# Patient Record
Sex: Male | Born: 1960 | Race: White | Hispanic: No | Marital: Single | Smoking: Current every day smoker
Health system: Southern US, Community
[De-identification: ages and names within clinical notes are randomized; demographics above are authoritative.]

## PROBLEM LIST (undated history)

## (undated) DIAGNOSIS — F419 Anxiety disorder, unspecified: Secondary | ICD-10-CM

## (undated) DIAGNOSIS — I499 Cardiac arrhythmia, unspecified: Secondary | ICD-10-CM

## (undated) DIAGNOSIS — Z9289 Personal history of other medical treatment: Secondary | ICD-10-CM

## (undated) DIAGNOSIS — I509 Heart failure, unspecified: Secondary | ICD-10-CM

## (undated) DIAGNOSIS — I429 Cardiomyopathy, unspecified: Secondary | ICD-10-CM

## (undated) DIAGNOSIS — M1990 Unspecified osteoarthritis, unspecified site: Secondary | ICD-10-CM

## (undated) DIAGNOSIS — K219 Gastro-esophageal reflux disease without esophagitis: Secondary | ICD-10-CM

## (undated) DIAGNOSIS — I10 Essential (primary) hypertension: Secondary | ICD-10-CM

## (undated) DIAGNOSIS — J449 Chronic obstructive pulmonary disease, unspecified: Secondary | ICD-10-CM

## (undated) DIAGNOSIS — R1310 Dysphagia, unspecified: Secondary | ICD-10-CM

## (undated) HISTORY — PX: ESOPHAGOGASTRODUODENOSCOPY: SHX1529

## (undated) HISTORY — PX: JOINT REPLACEMENT: SHX530

## (undated) HISTORY — PX: EYE SURGERY: SHX253

---

## 2007-10-23 ENCOUNTER — Ambulatory Visit (HOSPITAL_COMMUNITY): Admission: EM | Admit: 2007-10-23 | Discharge: 2007-10-23 | Payer: Self-pay | Admitting: Orthopedic Surgery

## 2007-12-23 ENCOUNTER — Inpatient Hospital Stay (HOSPITAL_COMMUNITY): Admission: RE | Admit: 2007-12-23 | Discharge: 2007-12-26 | Payer: Self-pay | Admitting: Orthopedic Surgery

## 2008-05-25 ENCOUNTER — Ambulatory Visit (HOSPITAL_COMMUNITY): Admission: RE | Admit: 2008-05-25 | Discharge: 2008-05-25 | Payer: Self-pay | Admitting: Orthopedic Surgery

## 2008-11-11 ENCOUNTER — Inpatient Hospital Stay (HOSPITAL_COMMUNITY): Admission: RE | Admit: 2008-11-11 | Discharge: 2008-11-13 | Payer: Self-pay | Admitting: Orthopedic Surgery

## 2010-05-19 LAB — PROTIME-INR: Prothrombin Time: 28.6 seconds — ABNORMAL HIGH (ref 11.6–15.2)

## 2010-05-19 LAB — CBC
Hemoglobin: 9.3 g/dL — ABNORMAL LOW (ref 13.0–17.0)
MCHC: 34.3 g/dL (ref 30.0–36.0)
MCV: 85.7 fL (ref 78.0–100.0)
RBC: 3.17 MIL/uL — ABNORMAL LOW (ref 4.22–5.81)
RDW: 15.6 % — ABNORMAL HIGH (ref 11.5–15.5)

## 2010-05-19 LAB — BASIC METABOLIC PANEL
Calcium: 7.6 mg/dL — ABNORMAL LOW (ref 8.4–10.5)
Chloride: 102 mEq/L (ref 96–112)
Creatinine, Ser: 0.67 mg/dL (ref 0.4–1.5)
Potassium: 3.9 mEq/L (ref 3.5–5.1)
Sodium: 133 mEq/L — ABNORMAL LOW (ref 135–145)

## 2010-05-20 LAB — CBC
HCT: 43.3 % (ref 39.0–52.0)
Hemoglobin: 14.9 g/dL (ref 13.0–17.0)
MCV: 86.1 fL (ref 78.0–100.0)
MCV: 86.5 fL (ref 78.0–100.0)
RBC: 3.34 MIL/uL — ABNORMAL LOW (ref 4.22–5.81)
RBC: 5 MIL/uL (ref 4.22–5.81)
RDW: 16.7 % — ABNORMAL HIGH (ref 11.5–15.5)
WBC: 6.8 10*3/uL (ref 4.0–10.5)

## 2010-05-20 LAB — COMPREHENSIVE METABOLIC PANEL
Alkaline Phosphatase: 98 U/L (ref 39–117)
CO2: 27 mEq/L (ref 19–32)
Calcium: 8.9 mg/dL (ref 8.4–10.5)
GFR calc Af Amer: >60 mL/min (ref >60)

## 2010-05-20 LAB — BASIC METABOLIC PANEL
Calcium: 7.9 mg/dL — ABNORMAL LOW (ref 8.4–10.5)
GFR calc non Af Amer: >60 mL/min (ref >60)
Glucose, Bld: 82 mg/dL (ref 70–99)

## 2010-05-20 LAB — URINALYSIS, ROUTINE W REFLEX MICROSCOPIC
Hgb urine dipstick: NEGATIVE
Nitrite: NEGATIVE
Protein, ur: NEGATIVE mg/dL

## 2010-05-20 LAB — TYPE AND SCREEN: Antibody Screen: NEGATIVE

## 2010-05-20 LAB — PROTIME-INR: Prothrombin Time: 12.6 seconds (ref 11.6–15.2)

## 2010-05-20 LAB — APTT: aPTT: 26 seconds (ref 24–37)

## 2010-06-28 NOTE — H&P (Signed)
[name], [name]            ACCOUNT NO.:  [id number]   MEDICAL RECORD NO.:  [id number]          PATIENT TYPE:  INP   LOCATION:  1616                         FACILITY:  [facility]   PHYSICIAN:  Ollen Gross, M.D.    DATE OF BIRTH:  Nov 30, 1960   DATE OF ADMISSION:  10/23/2007  DATE OF DISCHARGE:                              HISTORY & PHYSICAL   REASON FOR ADMISSION:  Right hip pain due to dislocated right total hip.   CHIEF COMPLAINTS:  Right hip pain.   HISTORY OF PRESENT ILLNESS:  This 50 year old male with a history of a  right total hip placement was getting out of bed on this past Saturday  when he had the sudden onset of right hip pain.  He had difficulty with  ambulation and had extreme pain.  He eventually went to the [facility] on Tuesday where x-rays revealed a dislocated right total hip  replacement.  He was transferred to the [facility] orthopedic service at  [facility].  He reports no other complications upon his arrival.   PAST MEDICAL HISTORY:  1. Osteoarthritis with a right total hip replacement.  2. Hypertension.   PAST SURGICAL HISTORY:  Right total hip replacement.   FAMILY HISTORY:  Noncontributory.   SOCIAL HISTORY:  He does smoke half a pack of cigarettes per day.   DRUG ALLERGIES:  CODEINE.   MEDICATIONS:  1. Percocet.  2. Diazepam.  3. Lisinopril.  (Verify dosage and frequency with the patient.)   REVIEW OF SYSTEMS:  See HPI.   PHYSICAL EXAMINATION:  VITAL SIGNS:  Pulse 72, respirations 16, blood  pressure 121/83.  GENERAL:  Awake, alert and oriented, well-developed, well-nourished.  NECK:  Supple.  No carotid bruits.  CHEST:  Lungs clear to auscultation bilaterally.  BREASTS:  Deferred.  HEART:  Regular rate and rhythm.  S1-S2 distinct.  ABDOMEN:  Soft, nontender, nondistended.  Bowel sounds present.  GENITOURINARY:  Deferred.  EXTREMITIES:  Right lower extremity shortened, externally rotated.  SKIN:  Dorsalis pedis pulse  positive.  NEUROLOGIC:  Intact distal sensibilities.   LABORATORY DATA:  All labs, EKG, chest x-ray all pending presurgical  testing.   IMPRESSION:  Dislocated right total hip replacement.   PLAN OF ACTION:  Closed reduction, right total hip replacement by Dr.  Lequita Halt.   He does have a scheduled revision of his right total hip in November per  patient history.     ______________________________  Yetta Glassman [place], [place]      Ollen Gross, M.D.  Electronically Signed    BLM/MEDQ  D:  10/23/2007  T:  10/23/2007  Job:  [id number]

## 2010-06-28 NOTE — H&P (Signed)
[name], [name]            ACCOUNT NO.:  [id number]   MEDICAL RECORD NO.:  [id number]          PATIENT TYPE:  INP   LOCATION:  NA                           FACILITY:  [facility]   PHYSICIAN:  Ollen Gross, M.D.    DATE OF BIRTH:  1960/10/10   DATE OF ADMISSION:  12/23/2007  DATE OF DISCHARGE:                              HISTORY & PHYSICAL   CHIEF COMPLAINT:  Right hip instability.   HISTORY OF PRESENT ILLNESS:  The patient is a 50 year old male well  known to Dr. Homero Fellers Aluisio.  Had previously undergone a right total hip.  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  Back in  1998, he underwent takedown of the right leg fusion and conversion over  to a right total hip.  Unfortunately he has gone on to have multiple  dislocations here recently felt be due to some polyethylene wear.  Risks  and benefits have been discussed and felt that he would benefit with  undergoing revision surgery.  The patient subsequently admitted to the  hospital.   ALLERGIES:  No known drug allergies.   CURRENT MEDICATIONS:  Diazepam, Prevacid, Percocet, lisinopril.   PAST MEDICAL HISTORY:  1. Hypertension.  2. Reflux disease.  3. History of diverticulosis.  4. Prior history of Legg-Calve Perthes' disease.   PAST SURGICAL HISTORY:  1. Had a fusion of his hip as a child.  2. Had a takedown fusion conversion over to a right total hip in 1998.  3. He has undergone diverticular disease.  4. He has also undergone multiple closed reductions for dislocations.   FAMILY HISTORY:  Father deceased at 16 with a history of stroke.  Mother  living at age 57 in good health with the exception of diabetes.   SOCIAL HISTORY:  Single.  Current smoker about a 1/2 pack per day.  No  alcohol.  Lives alone.  Does not have anyone lined up.   REVIEW OF SYSTEMS:  GENERAL:  No fevers, chills or night sweats.  NEURO:  No seizures,  syncope or paralysis.  RESPIRATORY: No shortness breath,  productive cough or hemoptysis.  CARDIOVASCULAR: No chest pain, angina  or orthopnea.  GI: No nausea, vomiting, diarrhea or constipation.  GU:  No dysuria, hematuria or discharge.  MUSCULOSKELETAL:  Right hip.   PHYSICAL EXAMINATION:  VITAL SIGNS: Pulse 56, respirations 12, blood  pressure 162/90.  GENERAL: A 50 year old white male, thin frame.  Alert, oriented and  cooperative.  HEENT: Normocephalic, atraumatic.  Pupils are round and reactive.  EOMs  intact.  NECK:  Supple.  No bruits.  CHEST: Clear anterior and posterior chest wall.  There are no rhonchi,  rales or wheezing.  HEART: Regular rate and rhythm.  S1 and S2 noted.  No murmurs.  ABDOMEN: Soft, flat, nontender.  Bowel sounds present.  BREASTS/ GENITALIA:  Not done.    EXTREMITIES:  Right hip flexion 110 degrees; internal rotation 20,  external rotation 30, abduction 30.   IMPRESSION:  Unstable right total hip arthroplasty.  PLAN:  The patient admitted to [facility] to undergo  acetabular revision of the previous right total hip surgery.  Will be  performed by Dr. Ollen Gross.      Alexzandrew L. Perkins, P.A.C.      Ollen Gross, M.D.  Electronically Signed    ALP/MEDQ  D:  12/17/2007  T:  12/17/2007  Job:  [id number]   cc:   Dr. Donata Duff, Drain

## 2010-06-28 NOTE — Op Note (Signed)
[name], [name]            ACCOUNT NO.:  [id number]   MEDICAL RECORD NO.:  [id number]          PATIENT TYPE:  INP   LOCATION:  1616                         FACILITY:  [facility]   PHYSICIAN:  Ollen Gross, M.D.    DATE OF BIRTH:  31-Jul-1960   DATE OF PROCEDURE:  10/23/2007  DATE OF DISCHARGE:                               OPERATIVE REPORT   PREOPERATIVE DIAGNOSIS:  Dislocated right total hip arthroplasty.   POSTOPERATIVE DIAGNOSIS:  Dislocated right total hip arthroplasty.   PROCEDURE:  Right hip closed reduction.   SURGEON:  Ollen Gross, M.D.   ASSISTANT:  None.   ANESTHESIA:  General   COMPLICATIONS:  None.   CONDITION:  Stable to recovery.   BRIEF CLINICAL NOTE:  [name] is a 50 year old male who underwent a  conversion of a fused right hip to a total hip arthroplasty  approximately 10 years ago.  He did very well until this past year when  he sustained approximately 2-3 dislocations.  He dislocated last night.  He was already scheduled for revision of the acetabular component in  November.  He was transferred from [facility] here for  management.  He presents now for closed reduction and bracing.   PROCEDURE IN DETAIL:  After the successful administration of general  anesthetic, my assistant placed counter traction on his pelvis while I  flexed the hip and pulled traction on it.  There was an audible  reduction.  Once reduced, he had very good range of motion with flexion  to 90 and about 30 degrees of rotation in each direction.  He had also  full extension with full external rotation.  X-ray showed that the hip  was reduced concentrically.  He was placed into an abduction pillow,  awakened and transported to recovery in stable condition.      Ollen Gross, M.D.  Electronically Signed     FA/MEDQ  D:  10/23/2007  T:  10/23/2007  Job:  [id number]

## 2010-06-28 NOTE — Op Note (Signed)
NAMEGARRIS, [name]            ACCOUNT NO.:  [id number]   MEDICAL RECORD NO.:  [id number]          PATIENT TYPE:  INP   LOCATION:  1612                         FACILITY:  [facility]   PHYSICIAN:  Ollen Gross, M.D.    DATE OF BIRTH:  [birth year]   DATE OF PROCEDURE:  12/23/2007  DATE OF DISCHARGE:                               OPERATIVE REPORT   PREOPERATIVE DIAGNOSIS:  Unstable right total hip arthroplasty.   POSTOPERATIVE DIAGNOSIS:  Unstable right total hip arthroplasty.   PROCEDURE:  Right total hip arthroplasty revision.   SURGEON:  Ollen Gross, M.D.   ASSISTANT:  [name] PA-C   ANESTHESIA:  General.   ESTIMATED BLOOD LOSS:  300 mL.   DRAIN:  Hemovac times one.   COMPLICATIONS:  None.   CONDITION:  Stable to recovery.   CLINICAL NOTE:  [name] is a 50 year old male who had a takedown of a  fused hip and conversion to a right total hip arthroplasty about 10 or  11 years ago.  The past year he has started to develop some instability  symptoms and has had two or more dislocations that had to be treated  with closed reduction.  He presents now for our revision for the  instability problem.   PROCEDURE IN DETAIL:  After successful administration of general  anesthetic, the patient is placed in left lateral decubitus position  with the right side up and held with the hip positioner.  Right lower  extremity is isolated from his perineum with plastic drapes and prepped  and draped in usual sterile fashion.  Posterolateral incision is made  with a 10 blade through subcutaneous tissue to the fascia lata which was  incised in line with the skin incision.  Sciatic nerve was palpated and  protected and posterior pseudocapsule excised off the femur.  He had a  fair amount of synovitis in the joint but the fluid was clear.  I  dislocated the hip and removed the femoral head.  I then disrupted the  interface between the SROM stem and sleeve and easily extracted the  stem.   This with 20 x 15 standard offset stem.  We then retracted the  femur anteriorly to gain acetabular closure.  Acetabular soft tissue  scar and spurs were removed off the rim of the component.  I removed the  polyethylene with an osteotome.  There were two peripheral screws which  were removed with the screwdriver.  I then disrupted the interface  between the acetabular shell and the underlying bone using first a 1/2  inch curved osteotome at the surface of the interface and then using the  Morland revision osteotomes to remove the bone from the cup.  The cup  was removed with minimal bone loss.  There is no acetabular defect.  This was a 54 mm cup.  I began reaming at 53 mm and went up to 55 mm  which had a good wound bed so I went to a 56 mm Pinnacle multihole  acetabular shell and placed in anatomic position with excellent purchase  and transfixed with two dome screws with excellent  purchase.  I then  placed a 40 mm trial liner for a 40 metal-on-metal replacement.  We then  placed a 20 x 15, 36 plus 5 trial into the femur.  With a 40 plus 0  head, it reduced easily,  with a 40 plus 6 it had perfect soft tissue  tension.  There was great stability with full extension, full external  rotation, 70 degrees flexion, 40 degrees adduction, 90 degrees internal  rotation and 90 degrees flexion with 90 degrees of internal rotation.  By placing the right leg on top of the left there is about 1/2 inch  difference which is between 1/4 and 3/8 inch better than preop.  Hip is  then dislocated.  Trials removed.  The permanent 40 mm neutral Ultamet  liner was placed into the acetabular shell and the permanent 20 x 15, 36  plus 12 stem was placed into the femur and impacted in about 20 degrees  of anteversion.  This matched the anteversion of the trial.  The 40.6  head is placed and the hip was reduced with the same stability  parameters.  The wound was copiously irrigated with saline solution and   posterior soft tissues reattached with interrupted #1 Ethibond.  Fascia  lata was closed over a Hemovac drain with interrupted #1 Vicryl, subcu  closed with 2-0 Vicryl and skin with staples.  Drains hooked to suction.  Incision cleaned and dried.  A bulky sterile dressing applied.  He is  placed into a knee immobilizer, awakened and transferred to recovery in  stable condition.      Ollen Gross, M.D.  Electronically Signed     FA/MEDQ  D:  12/23/2007  T:  12/24/2007  Job:  [id number]

## 2010-07-01 NOTE — Discharge Summary (Signed)
[name], [name]            ACCOUNT NO.:  [id number]   MEDICAL RECORD NO.:  [id number]          PATIENT TYPE:  INP   LOCATION:  1612                         [facility]ACILITY:  [facility]   PHYSICIAN:  Ollen Gross, M.D.    DATE OF BIRTH:  1960/08/22   DATE OF ADMISSION:  12/23/2007  DATE OF DISCHARGE:  12/26/2007                               DISCHARGE SUMMARY   ADMITTING DIAGNOSES:  1. Unstable right total hip.  2. Hypertension.  3. Reflux disease.  4. History of diverticulosis.  5. History of Legg-Calve-Perthes disease.   DISCHARGE DIAGNOSES:  1. Unstable right total hip arthroplasty status post right total hip      arthroplasty revision.  2. Mild postop blood loss anemia, did not require transfusion.  3. Mild postop hypokalemia.  4. Mild postop hyponatremia.  5. Unstable right total hip.  6. Hypertension.  7. Reflux disease.  8. History of diverticulosis.  9. History of Legg-Calve-Perthes disease.   PROCEDURE:  December 23, 2007 right total hip arthroplasty revision,  surgeon Dr. Despina Hick. Assistant [name] PA-C.  Anesthesia general.   CONSULTS:  None.   BRIEF HISTORY:  [name] is a 50 year old male with a takedown fusion  conversion to right total hip about 10 or 11 years ago.  In the past  year, he started to develop some instability symptoms, had 2 or more  dislocations, treated with closed reductions.  Now presents for a  revision procedure for instability.   LABORATORY DATA:  Preop CBC showed hemoglobin 16.1, hematocrit of 46.7,  white cell count 7.3, platelets 315.  Postop hemoglobin 12.2, 22.7  H  and H 12.1 and 35.6.  PT/PTT preop 12.8 and 27 respectively, INR 1.  Serial protimes followed in protocol.  PT/INR 22, 1.8.  Chem panel on  admission all within normal limits.  Serial BMETs were followed.  Potassium dropped to 3.6, 3.4 back up to 3.6, sodium dropped 140-137  last noted at 133.  Preop UA, small bili otherwise negative.  Blood type  O negative.  EKG  October 23, 2007 sinus rhythm, right axis deviation, nonspecific T-  wave abnormality, no previous to compare confirmed by Dr. Cathren Laine.  X-rays:  2-view chest December 23, 2007 no active disease.  Hip films  December 17, 2007 stable appearance of bipolar right hip prosthesis,  chronic acetabular deformity, abnormal tilting of acetabular component,  no acute findings.  Hip and pelvis film:  Postoperative changes, status  post arthroplasty revision.  No adverse features noted.   HOSPITAL COURSE:  The patient admitted to [facility],  tolerated te procedure well, later transferred to orthopedic floor  started on PCA and p.o. analgesic for pain control.  __________ postop  IV antibiotics.  Doing very well on the morning of day 1, actually got  up and walked with therapy.  Had a little low sodium, so put on  potassium supplements.  Blood pressure meds for resumed although  lisinopril was on hold because his pressure was low normal .  Postoperative had decent output.  By day 2, dressing change, incision  looked good.  Progressing well with physical  therapy.   DISCHARGE/PLAN:  Started making arrangements for home, although there  was some concern whether he would need some skilled nursing facility,  since he did live alone.  He did contact the family members and some  arrangements were made for him to go home with family.  Home health was  set up and he was doing well on December 26, 2007 and discharged home  with family.   DISCHARGE/PLAN:  1. Was discharged home December 26, 2007.  2. Discharge diagnoses please see above.  3. Discharge meds:  Percocet, Robaxin, Coumadin.   DISCHARGE DIET:  Heart-healthy diet   ACTIVITIES:  Partial weightbearing 25-50 percent right lower extremity.  Hip precautions, total protocol.   FOLLOW UP:  Two weeks.   DISPOSITION:  Home with family.   DISCHARGE CONDITION:  Stable, improving.   CONDITION ON DISCHARGE:  Improved.  Assistant facial  date of clock of  clot point.  Thank you      Alexzandrew L. Perkins, P.A.C.      Ollen Gross, M.D.  Electronically Signed    ALP/MEDQ  D:  02/05/2008  T:  02/05/2008  Job:  [id number]   cc:   Ollen Gross, M.D.  Fax: [phone]   Dr. Wyline Mood

## 2010-11-15 LAB — CBC
HCT: 33.8 — ABNORMAL LOW
HCT: 35.6 — ABNORMAL LOW
Hemoglobin: 11.7 — ABNORMAL LOW
Hemoglobin: 16.1
MCV: 92.4
Platelets: 315
RBC: 3.81 — ABNORMAL LOW
RBC: 3.85 — ABNORMAL LOW
RBC: 5.02
RDW: 15.4
WBC: 10.4
WBC: 10.5
WBC: 10.8 — ABNORMAL HIGH
WBC: 7.3

## 2010-11-15 LAB — URINALYSIS, ROUTINE W REFLEX MICROSCOPIC
Glucose, UA: NEGATIVE
Hgb urine dipstick: NEGATIVE
Protein, ur: NEGATIVE

## 2010-11-15 LAB — TYPE AND SCREEN
ABO/RH(D): O NEG
Antibody Screen: NEGATIVE

## 2010-11-15 LAB — PROTIME-INR
INR: 1.1
Prothrombin Time: 12.8
Prothrombin Time: 14.6
Prothrombin Time: 22 — ABNORMAL HIGH

## 2010-11-15 LAB — BASIC METABOLIC PANEL
Calcium: 8.4
Chloride: 102
Creatinine, Ser: 0.8
GFR calc Af Amer: >60
GFR calc non Af Amer: >60
Potassium: 3.6
Sodium: 133 — ABNORMAL LOW

## 2010-11-15 LAB — COMPREHENSIVE METABOLIC PANEL
ALT: 22
AST: 18
Alkaline Phosphatase: 87
BUN: 11
CO2: 26
Calcium: 9.1
Creatinine, Ser: 0.89
GFR calc Af Amer: >60
Glucose, Bld: 93
Potassium: 3.6
Sodium: 142

## 2010-11-15 LAB — ABO/RH: ABO/RH(D): O NEG

## 2010-11-16 LAB — CBC
HCT: 38.2 — ABNORMAL LOW
Hemoglobin: 13
RBC: 4.11 — ABNORMAL LOW
RDW: 15.1

## 2010-11-16 LAB — BASIC METABOLIC PANEL
GFR calc non Af Amer: >60
Glucose, Bld: 90
Potassium: 3.6
Sodium: 140

## 2010-11-16 LAB — PROTIME-INR
INR: 1.1
Prothrombin Time: 14.3

## 2011-04-04 ENCOUNTER — Other Ambulatory Visit: Payer: Self-pay | Admitting: Orthopedic Surgery

## 2011-06-28 ENCOUNTER — Encounter (HOSPITAL_COMMUNITY): Admission: RE | Payer: Self-pay | Source: Ambulatory Visit

## 2011-06-28 ENCOUNTER — Ambulatory Visit (HOSPITAL_COMMUNITY)
Admission: RE | Admit: 2011-06-28 | Payer: PRIVATE HEALTH INSURANCE | Source: Ambulatory Visit | Admitting: Orthopedic Surgery

## 2011-06-28 SURGERY — TOTAL HIP REVISION
Anesthesia: Choice | Site: Hip | Laterality: Right

## 2012-04-12 ENCOUNTER — Other Ambulatory Visit: Payer: Self-pay | Admitting: Orthopedic Surgery

## 2012-04-12 MED ORDER — DEXAMETHASONE SODIUM PHOSPHATE 10 MG/ML IJ SOLN: 10.0000 mg | Freq: Once | INTRAMUSCULAR | Status: DC

## 2012-04-12 MED ORDER — BUPIVACAINE LIPOSOME 1.3 % IJ SUSP: 20.0000 mL | Freq: Once | INTRAMUSCULAR | Status: DC

## 2012-04-12 NOTE — Progress Notes (Signed)
Preoperative surgical orders have been place into the Epic hospital system for [name] on 04/12/2012, 2:06 PM  by Patrica Duel for surgery on 05/15/2012.  Preop Total Hip orders including Experel Injecion, IV Tylenol, and IV Decadron as long as there are no contraindications to the above medications. [name], PA-C

## 2012-05-02 ENCOUNTER — Encounter (HOSPITAL_COMMUNITY): Payer: Self-pay | Admitting: Pharmacy Technician

## 2012-05-08 ENCOUNTER — Ambulatory Visit (HOSPITAL_COMMUNITY)
Admission: RE | Admit: 2012-05-08 | Discharge: 2012-05-08 | Disposition: A | Discharge status: Home / Self Care | Payer: PRIVATE HEALTH INSURANCE | Source: Ambulatory Visit | Attending: Orthopedic Surgery | Admitting: Orthopedic Surgery

## 2012-05-08 ENCOUNTER — Encounter (HOSPITAL_COMMUNITY): Payer: Self-pay

## 2012-05-08 ENCOUNTER — Encounter (HOSPITAL_COMMUNITY)
Admission: RE | Admit: 2012-05-08 | Discharge: 2012-05-08 | Disposition: A | Discharge status: Home / Self Care | Payer: PRIVATE HEALTH INSURANCE | Source: Ambulatory Visit | Attending: Orthopedic Surgery | Admitting: Orthopedic Surgery

## 2012-05-08 DIAGNOSIS — M899 Disorder of bone, unspecified: Secondary | ICD-10-CM | POA: Insufficient documentation

## 2012-05-08 DIAGNOSIS — Z01812 Encounter for preprocedural laboratory examination: Secondary | ICD-10-CM | POA: Insufficient documentation

## 2012-05-08 DIAGNOSIS — Z01818 Encounter for other preprocedural examination: Secondary | ICD-10-CM | POA: Insufficient documentation

## 2012-05-08 DIAGNOSIS — Z96649 Presence of unspecified artificial hip joint: Secondary | ICD-10-CM | POA: Insufficient documentation

## 2012-05-08 DIAGNOSIS — F17200 Nicotine dependence, unspecified, uncomplicated: Secondary | ICD-10-CM | POA: Insufficient documentation

## 2012-05-08 DIAGNOSIS — R9431 Abnormal electrocardiogram [ECG] [EKG]: Secondary | ICD-10-CM | POA: Insufficient documentation

## 2012-05-08 DIAGNOSIS — Z01810 Encounter for preprocedural cardiovascular examination: Secondary | ICD-10-CM | POA: Insufficient documentation

## 2012-05-08 DIAGNOSIS — I10 Essential (primary) hypertension: Secondary | ICD-10-CM | POA: Insufficient documentation

## 2012-05-08 DIAGNOSIS — M949 Disorder of cartilage, unspecified: Secondary | ICD-10-CM | POA: Insufficient documentation

## 2012-05-08 HISTORY — DX: Cardiomyopathy, unspecified: I42.9

## 2012-05-08 HISTORY — DX: Cardiac arrhythmia, unspecified: I49.9

## 2012-05-08 HISTORY — DX: Personal history of other medical treatment: Z92.89

## 2012-05-08 HISTORY — DX: Gastro-esophageal reflux disease without esophagitis: K21.9

## 2012-05-08 HISTORY — DX: Heart failure, unspecified: I50.9

## 2012-05-08 HISTORY — DX: Dysphagia, unspecified: R13.10

## 2012-05-08 HISTORY — DX: Unspecified osteoarthritis, unspecified site: M19.90

## 2012-05-08 HISTORY — DX: Anxiety disorder, unspecified: F41.9

## 2012-05-08 HISTORY — DX: Chronic obstructive pulmonary disease, unspecified: J44.9

## 2012-05-08 HISTORY — DX: Essential (primary) hypertension: I10

## 2012-05-08 LAB — COMPREHENSIVE METABOLIC PANEL
AST: 13 U/L (ref 0–37)
BUN: 8 mg/dL (ref 6–23)
CO2: 27 mEq/L (ref 19–32)
Calcium: 8.9 mg/dL (ref 8.4–10.5)
Chloride: 98 mEq/L (ref 96–112)
Creatinine, Ser: 0.83 mg/dL (ref 0.50–1.35)
GFR calc Af Amer: >90 mL/min (ref >90)
GFR calc non Af Amer: >90 mL/min (ref >90)
Glucose, Bld: 83 mg/dL (ref 70–99)
Total Bilirubin: 0.5 mg/dL (ref 0.3–1.2)

## 2012-05-08 LAB — URINALYSIS, ROUTINE W REFLEX MICROSCOPIC
Bilirubin Urine: NEGATIVE
Glucose, UA: NEGATIVE mg/dL
Hgb urine dipstick: NEGATIVE
Ketones, ur: NEGATIVE mg/dL
Leukocytes, UA: NEGATIVE
Protein, ur: NEGATIVE mg/dL
pH: 7 (ref 5.0–8.0)

## 2012-05-08 LAB — CBC
HCT: 38.5 % — ABNORMAL LOW (ref 39.0–52.0)
MCH: 27.3 pg (ref 26.0–34.0)
MCV: 81.4 fL (ref 78.0–100.0)
Platelets: 294 10*3/uL (ref 150–400)
RBC: 4.73 MIL/uL (ref 4.22–5.81)

## 2012-05-08 LAB — PROTIME-INR
INR: 1.07 (ref 0.00–1.49)
Prothrombin Time: 13.8 seconds (ref 11.6–15.2)

## 2012-05-08 NOTE — Progress Notes (Signed)
Chest x ray, CT 6/13 chart,  OV Dr Bing Matter 6/13 with eccho, PFT, stress test with EKG 6/13  on chart

## 2012-05-08 NOTE — Patient Instructions (Addendum)
20 Ernestine Langworthy  05/08/2012   Your procedure is scheduled on:  05/15/12  [facility]  Report to [facility] at    1000   AM.  Call this number if you have problems the morning of surgery: [phone]       Remember:   Do not eat food  Or drink :After Midnight. Tuesday NIGHT   Take these medicines the morning of surgery with A SIP OF WATER:   CARVEDILOL                                             MAY TAKE NORCO IF NEEDED   .  Contacts, dentures or partial plates can not be worn to surgery  Leave suitcase in the car. After surgery it may be brought to your room.  For patients admitted to the hospital, checkout time is 11:00 AM day of  discharge.             SPECIAL INSTRUCTIONS- SEE [place] PREPARING FOR SURGERY INSTRUCTION SHEET-     DO NOT WEAR JEWELRY, LOTIONS, POWDERS, OR PERFUMES.  WOMEN-- DO NOT SHAVE LEGS OR UNDERARMS FOR 12 HOURS BEFORE SHOWERS. MEN MAY SHAVE FACE.  Patients discharged the day of surgery will not be allowed to drive home. IF going home the day of surgery, you must have a driver and someone to stay with you for the first 24 hours  Name and phone number of your driver:  sister                                                                      Please read over the following fact sheets that you were given: MRSA Information, Incentive Spirometry Sheet, Blood Transfusion Sheet  Information                                                                                   Shemeka Wardle  PST 336  [phone]                 FAILURE TO FOLLOW THESE INSTRUCTIONS MAY RESULT IN  CANCELLATION   OF YOUR SURGERY                                                  Patient Signature _____________________________

## 2012-05-08 NOTE — Progress Notes (Signed)
Eccho, stress test with EKG, PFT 6/13 chart, OV Dr Bing Matter 6/13 on chart

## 2012-05-08 NOTE — Progress Notes (Signed)
AT PST VISIT_  Patient states has not been back to cardiologist or his primary care doctor sine hospitalization 6/13, and take [place] Ramp some days when he remembers- states has not received instructions regarding Xeralto.  Left message with Parke Poisson at Dr Salina April office notifying her I do not have clearance, nor does patient know what to do about Xeralto. Spoke with Toniann Fail in pts presence who stated they are not even aware of Xeralto or cardiology issues. Patient states he did not tell Kenard Gower at his appt 04/30/12 of this nor of medicine.   Instructed patient as per Toniann Fail to call cardiology office to get instructions regarding Xeralto today. She stated she would send clearance letter to cardiology as well and would discuss with Gareth Eagle PA in AM.  Patient notified that cardiology would probably want to see him pre op and that if surgery can not be done without clearance. Verbalized understanding

## 2012-05-09 NOTE — Progress Notes (Signed)
Faxed all cardiac records to Parke Poisson at [facility] for review by Gareth Eagle with confirmation

## 2012-05-14 ENCOUNTER — Other Ambulatory Visit: Payer: Self-pay | Admitting: Orthopedic Surgery

## 2012-05-14 NOTE — H&P (Signed)
[name]  DOB: 06/12/1960 Single / Language: English / Race: White Male  Date of Admission:  05/15/2012  Chief Complaint:  Right Hip Pain  History of Present Illness The patient is a 52 year old male who comes in for a preoperative History and Physical. The patient is scheduled for a right total hip arthroplasty (revision) at [facility]. The patient is a 51 year old male presenting for a post-operative visit. The patient comes in today 3 years out from right total hip arthroplasty (femoral revision, and 15 years out from left total hip). The patient states that he/she is not doing well at this time. The pain is under poor control at this time and describes their pain as moderate. They are currently on Hydrocodone for their pain. The patient feels that they are progressing poorly at this time. . He still has significant pain in that right hip. Quite awhile back he had that MARS MRI and had a fluid collection. We called him and we were going to schedule a revision hip but then he opted against it. He's now at a stage where he wants to go ahead and get it fixed. They have been treated conservatively in the past for the above stated problem and despite conservative measures, they continue to have progressive pain and severe functional limitations and dysfunction. They have failed non-operative management. It is felt that they would benefit from undergoing total joint replacement. Risks and benefits of the procedure have been discussed with the patient and they elect to proceed with surgery. There are no active contraindications to surgery such as ongoing infection or rapidly progressive neurological disease.    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 Nausea.   Family History Diabetes Mellitus. mother Heart Disease. father   Social History Pain  Contract. no Previously in rehab. no Number of flights of stairs before winded. less than 1 Living situation. live alone Marital status. single Tobacco / smoke exposure. yes Tobacco use. Current every day smoker. 1/2 pack day current every day smoker; smoke(d) less than 1/2 pack(s) per day Illicit drug use. no No alcohol use Drug/Alcohol Rehab (Currently). no Exercise. Exercises rarely; does running / walking Current work status. disabled Alcohol use. current drinker; drinks beer; only occasionally per week Children. 0 Post-Surgical Plans. Plan is to go home.   Medication History Prevacid (30MG Capsule DR, Oral) Active. Norco (5-325MG Tablet, Oral) Active. Xanax (1MG Tablet, Oral) Active. Xarelto (10MG Tablet, Oral) Active. (Pateint instructed to stop 24 hours before his surgery.)   Past Surgical History Total Hip Replacement. bilateral   Medical History High blood pressure Atrial Fibrillation. Paroxysmal Congestive Heart Failure  Review of Systems General:Not Present- Chills, Fever, Night Sweats, Fatigue, Weight Gain, Weight Loss and Memory Loss. Skin:Not Present- Hives, Itching, Rash, Eczema and Lesions. HEENT:Not Present- Tinnitus, Headache, Double Vision, Visual Loss, Hearing Loss and Dentures. Respiratory:Not Present- Shortness of breath with exertion, Shortness of breath at rest, Allergies, Coughing up blood and Chronic Cough. Cardiovascular:Not Present- Chest Pain, Racing/skipping heartbeats, Difficulty Breathing Lying Down, Murmur, Swelling and Palpitations. Gastrointestinal:Not Present- Bloody Stool, Heartburn, Abdominal Pain, Vomiting, Nausea, Constipation, Diarrhea, Difficulty Swallowing, Jaundice and Loss of appetitie. Male Genitourinary:Not Present- Urinary frequency, Blood in Urine, Weak urinary stream, Discharge, Flank Pain, Incontinence, Painful Urination, Urgency, Urinary Retention and Urinating at  Night. Musculoskeletal:Present- Joint Pain and Morning Stiffness. Not Present- Muscle Weakness, Muscle Pain, Joint Swelling, Back Pain and Spasms.   Neurological:Not Present- Tremor, Dizziness, Blackout spells, Paralysis, Difficulty with balance and Weakness. Psychiatric:Not Present- Insomnia.   Vitals Weight: 191 lb Height: 70 in Body Surface Area: 2.07 m Body Mass Index: 27.41 kg/m Pulse: 72 (Regular) Resp.: 14 (Unlabored) BP: 168/100 (Sitting, Right Arm, Standard)    Physical Exam The physical exam findings are as follows:   General Mental Status - Alert, cooperative and good historian. General Appearance- pleasant. Not in acute distress. Orientation- Oriented X3. Build & Nutrition- Well nourished and Well developed.   Head and Neck Head- normocephalic, atraumatic . Neck Global Assessment- supple. no bruit auscultated on the right and no bruit auscultated on the left.   Eye Vision- Wears corrective lenses (reading glasses). Pupil- Bilateral- Regular and Round. Motion- Bilateral- EOMI.   Chest and Lung Exam Auscultation: Breath sounds:- clear at anterior chest wall and - clear at posterior chest wall. Adventitious sounds:- No Adventitious sounds.   Cardiovascular Auscultation:Rhythm- Regular rate and rhythm. Heart Sounds- S1 WNL and S2 WNL. Murmurs & Other Heart Sounds:Auscultation of the heart reveals - No Murmurs.   Abdomen Palpation/Percussion:Tenderness- Abdomen is non-tender to palpation. Rigidity (guarding)- Abdomen is soft. Auscultation:Auscultation of the abdomen reveals - Bowel sounds normal.   Male Genitourinary Not done, not pertinent to present illness  Musculoskeletal Global Assessment Gait and Station- antalgic gait (using a cane). On exam, he's alert and oriented, in no apparent distress. His right hip can be flexed to 100. Rotation in 20, out 30, abduction 30 with discomfort on ROM. I  reviewed his  Radiographs: x-rays from last visit. Socket appears to be well fixed. He has a metal on metal hip.  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 Aluisio.  Plan is to go home.  PCP - Dr. Stephen Campbell, [place], [place]  Signed electronically by DREW L Barbarita Hutmacher, PA-C 

## 2012-05-14 NOTE — Progress Notes (Signed)
Call from Dr. Deri Fuelling office re: cardiac clearance for surgery tomorrow still having testing procedure  today, [name], PA. Aware no additional orders 1200.

## 2012-05-15 ENCOUNTER — Inpatient Hospital Stay (HOSPITAL_COMMUNITY)
Admission: RE | Admit: 2012-05-15 | Discharge: 2012-05-17 | DRG: 467 | Disposition: A | Discharge status: Home Health | Payer: PRIVATE HEALTH INSURANCE | Source: Ambulatory Visit | Attending: Orthopedic Surgery | Admitting: Orthopedic Surgery

## 2012-05-15 ENCOUNTER — Encounter (HOSPITAL_COMMUNITY): Payer: Self-pay | Admitting: *Deleted

## 2012-05-15 ENCOUNTER — Encounter (HOSPITAL_COMMUNITY): Payer: Self-pay | Admitting: Certified Registered Nurse Anesthetist

## 2012-05-15 ENCOUNTER — Encounter (HOSPITAL_COMMUNITY)
Admission: RE | Disposition: A | Discharge status: Home Health | Payer: Self-pay | Source: Ambulatory Visit | Attending: Orthopedic Surgery

## 2012-05-15 ENCOUNTER — Inpatient Hospital Stay (HOSPITAL_COMMUNITY): Payer: PRIVATE HEALTH INSURANCE

## 2012-05-15 ENCOUNTER — Inpatient Hospital Stay (HOSPITAL_COMMUNITY): Payer: PRIVATE HEALTH INSURANCE | Admitting: Certified Registered Nurse Anesthetist

## 2012-05-15 DIAGNOSIS — K219 Gastro-esophageal reflux disease without esophagitis: Secondary | ICD-10-CM | POA: Diagnosis present

## 2012-05-15 DIAGNOSIS — J449 Chronic obstructive pulmonary disease, unspecified: Secondary | ICD-10-CM | POA: Diagnosis present

## 2012-05-15 DIAGNOSIS — Y831 Surgical operation with implant of artificial internal device as the cause of abnormal reaction of the patient, or of later complication, without mention of misadventure at the time of the procedure: Secondary | ICD-10-CM | POA: Diagnosis present

## 2012-05-15 DIAGNOSIS — Z96649 Presence of unspecified artificial hip joint: Secondary | ICD-10-CM

## 2012-05-15 DIAGNOSIS — I4891 Unspecified atrial fibrillation: Secondary | ICD-10-CM | POA: Diagnosis present

## 2012-05-15 DIAGNOSIS — T84018A Broken internal joint prosthesis, other site, initial encounter: Secondary | ICD-10-CM

## 2012-05-15 DIAGNOSIS — J4489 Other specified chronic obstructive pulmonary disease: Secondary | ICD-10-CM | POA: Diagnosis present

## 2012-05-15 DIAGNOSIS — F17200 Nicotine dependence, unspecified, uncomplicated: Secondary | ICD-10-CM | POA: Diagnosis present

## 2012-05-15 DIAGNOSIS — F411 Generalized anxiety disorder: Secondary | ICD-10-CM | POA: Diagnosis present

## 2012-05-15 DIAGNOSIS — I428 Other cardiomyopathies: Secondary | ICD-10-CM | POA: Diagnosis present

## 2012-05-15 DIAGNOSIS — T84099A Other mechanical complication of unspecified internal joint prosthesis, initial encounter: Principal | ICD-10-CM | POA: Diagnosis present

## 2012-05-15 DIAGNOSIS — I10 Essential (primary) hypertension: Secondary | ICD-10-CM | POA: Diagnosis present

## 2012-05-15 DIAGNOSIS — I509 Heart failure, unspecified: Secondary | ICD-10-CM | POA: Diagnosis present

## 2012-05-15 HISTORY — PX: ACETABULAR REVISION: SHX5712

## 2012-05-15 LAB — GRAM STAIN

## 2012-05-15 LAB — TYPE AND SCREEN: ABO/RH(D): O NEG

## 2012-05-15 SURGERY — REVISION, TOTAL ARTHROPLASTY, HIP, ACETABULAR COMPONENT
Anesthesia: General | Site: Hip | Laterality: Right | Wound class: Clean

## 2012-05-15 MED ORDER — DIPHENHYDRAMINE HCL 12.5 MG/5ML PO ELIX: 12.5000 mg | ORAL_SOLUTION | ORAL | Status: DC | PRN

## 2012-05-15 MED ORDER — ROCURONIUM BROMIDE 100 MG/10ML IV SOLN
INTRAVENOUS | Status: DC | PRN
  Administered 2012-05-15: 30 mg via INTRAVENOUS

## 2012-05-15 MED ORDER — CARVEDILOL 6.25 MG PO TABS
6.2500 mg | ORAL_TABLET | Freq: Two times a day (BID) | ORAL | Status: DC
  Administered 2012-05-15: 6.25 mg via ORAL
  Filled 2012-05-15 (×3): qty 1

## 2012-05-15 MED ORDER — DEXAMETHASONE 6 MG PO TABS
10.0000 mg | ORAL_TABLET | Freq: Once | ORAL | Status: AC
  Administered 2012-05-16: 10 mg via ORAL
  Filled 2012-05-15: qty 1

## 2012-05-15 MED ORDER — TRAMADOL HCL 50 MG PO TABS: 50.0000 mg | ORAL_TABLET | Freq: Four times a day (QID) | ORAL | Status: DC | PRN

## 2012-05-15 MED ORDER — ACETAMINOPHEN 10 MG/ML IV SOLN: 1000.0000 mg | Freq: Once | INTRAVENOUS | Status: DC

## 2012-05-15 MED ORDER — EPHEDRINE SULFATE 50 MG/ML IJ SOLN
INTRAMUSCULAR | Status: DC | PRN
  Administered 2012-05-15 (×2): 10 mg via INTRAVENOUS

## 2012-05-15 MED ORDER — NEOSTIGMINE METHYLSULFATE 1 MG/ML IJ SOLN
INTRAMUSCULAR | Status: DC | PRN
  Administered 2012-05-15: 5 mg via INTRAVENOUS

## 2012-05-15 MED ORDER — ACETAMINOPHEN 10 MG/ML IV SOLN
1000.0000 mg | Freq: Four times a day (QID) | INTRAVENOUS | Status: AC
  Administered 2012-05-16 (×4): 1000 mg via INTRAVENOUS
  Filled 2012-05-15 (×7): qty 100

## 2012-05-15 MED ORDER — FENTANYL CITRATE 0.05 MG/ML IJ SOLN
INTRAMUSCULAR | Status: DC | PRN
  Administered 2012-05-15: 50 ug via INTRAVENOUS
  Administered 2012-05-15: 100 ug via INTRAVENOUS
  Administered 2012-05-15 (×2): 50 ug via INTRAVENOUS

## 2012-05-15 MED ORDER — POLYETHYLENE GLYCOL 3350 17 G PO PACK: 17.0000 g | PACK | Freq: Every day | ORAL | Status: DC | PRN

## 2012-05-15 MED ORDER — 0.9 % SODIUM CHLORIDE (POUR BTL) OPTIME
TOPICAL | Status: DC | PRN
  Administered 2012-05-15: 1000 mL

## 2012-05-15 MED ORDER — METOCLOPRAMIDE HCL 10 MG PO TABS: 5.0000 mg | ORAL_TABLET | Freq: Three times a day (TID) | ORAL | Status: DC | PRN

## 2012-05-15 MED ORDER — MORPHINE SULFATE 2 MG/ML IJ SOLN: 1.0000 mg | INTRAMUSCULAR | Status: DC | PRN

## 2012-05-15 MED ORDER — HYDROMORPHONE HCL PF 1 MG/ML IJ SOLN
0.2500 mg | INTRAMUSCULAR | Status: DC | PRN
  Administered 2012-05-15 (×4): 0.5 mg via INTRAVENOUS

## 2012-05-15 MED ORDER — METHOCARBAMOL 500 MG PO TABS
500.0000 mg | ORAL_TABLET | Freq: Four times a day (QID) | ORAL | Status: DC | PRN
  Administered 2012-05-15: 500 mg via ORAL
  Filled 2012-05-15: qty 1

## 2012-05-15 MED ORDER — FLEET ENEMA 7-19 GM/118ML RE ENEM: 1.0000 | ENEMA | Freq: Once | RECTAL | Status: AC | PRN

## 2012-05-15 MED ORDER — ACETAMINOPHEN 325 MG PO TABS: 650.0000 mg | ORAL_TABLET | Freq: Four times a day (QID) | ORAL | Status: DC | PRN

## 2012-05-15 MED ORDER — MENTHOL 3 MG MT LOZG
1.0000 | LOZENGE | OROMUCOSAL | Status: DC | PRN
  Filled 2012-05-15: qty 9

## 2012-05-15 MED ORDER — METHOCARBAMOL 100 MG/ML IJ SOLN: 500.0000 mg | Freq: Four times a day (QID) | INTRAVENOUS | Status: DC | PRN

## 2012-05-15 MED ORDER — ONDANSETRON HCL 4 MG PO TABS: 4.0000 mg | ORAL_TABLET | Freq: Four times a day (QID) | ORAL | Status: DC | PRN

## 2012-05-15 MED ORDER — PROPOFOL 10 MG/ML IV BOLUS
INTRAVENOUS | Status: DC | PRN
  Administered 2012-05-15: 150 mg via INTRAVENOUS

## 2012-05-15 MED ORDER — ACETAMINOPHEN 10 MG/ML IV SOLN
INTRAVENOUS | Status: DC | PRN
  Administered 2012-05-15: 1000 mg via INTRAVENOUS

## 2012-05-15 MED ORDER — ONDANSETRON HCL 4 MG/2ML IJ SOLN: 4.0000 mg | Freq: Four times a day (QID) | INTRAMUSCULAR | Status: DC | PRN

## 2012-05-15 MED ORDER — KCL IN DEXTROSE-NACL 20-5-0.9 MEQ/L-%-% IV SOLN
INTRAVENOUS | Status: DC
  Administered 2012-05-16: 09:00:00 via INTRAVENOUS
  Filled 2012-05-15 (×3): qty 1000

## 2012-05-15 MED ORDER — LIDOCAINE HCL (CARDIAC) 20 MG/ML IV SOLN
INTRAVENOUS | Status: DC | PRN
  Administered 2012-05-15: 100 mg via INTRAVENOUS

## 2012-05-15 MED ORDER — PHENOL 1.4 % MT LIQD: 1.0000 | OROMUCOSAL | Status: DC | PRN

## 2012-05-15 MED ORDER — MIDAZOLAM HCL 5 MG/5ML IJ SOLN
INTRAMUSCULAR | Status: DC | PRN
  Administered 2012-05-15: 2 mg via INTRAVENOUS

## 2012-05-15 MED ORDER — SUCCINYLCHOLINE CHLORIDE 20 MG/ML IJ SOLN
INTRAMUSCULAR | Status: DC | PRN
  Administered 2012-05-15: 100 mg via INTRAVENOUS

## 2012-05-15 MED ORDER — DOCUSATE SODIUM 100 MG PO CAPS
100.0000 mg | ORAL_CAPSULE | Freq: Two times a day (BID) | ORAL | Status: DC
  Administered 2012-05-15 – 2012-05-17 (×4): 100 mg via ORAL

## 2012-05-15 MED ORDER — FLUTICASONE PROPIONATE 50 MCG/ACT NA SUSP
2.0000 | Freq: Every day | NASAL | Status: DC
  Administered 2012-05-16 – 2012-05-17 (×2): 2 via NASAL
  Filled 2012-05-15: qty 16

## 2012-05-15 MED ORDER — ONDANSETRON HCL 4 MG/2ML IJ SOLN
INTRAMUSCULAR | Status: DC | PRN
  Administered 2012-05-15: 4 mg via INTRAVENOUS

## 2012-05-15 MED ORDER — DEXAMETHASONE SODIUM PHOSPHATE 10 MG/ML IJ SOLN: 10.0000 mg | Freq: Once | INTRAMUSCULAR | Status: AC

## 2012-05-15 MED ORDER — LACTATED RINGERS IV SOLN
INTRAVENOUS | Status: DC
  Administered 2012-05-15: 15:00:00 via INTRAVENOUS

## 2012-05-15 MED ORDER — CEFAZOLIN SODIUM-DEXTROSE 2-3 GM-% IV SOLR
2.0000 g | INTRAVENOUS | Status: AC
  Administered 2012-05-15: 2 g via INTRAVENOUS

## 2012-05-15 MED ORDER — METOCLOPRAMIDE HCL 5 MG/ML IJ SOLN: 5.0000 mg | Freq: Three times a day (TID) | INTRAMUSCULAR | Status: DC | PRN

## 2012-05-15 MED ORDER — BUPIVACAINE LIPOSOME 1.3 % IJ SUSP
20.0000 mL | Freq: Once | INTRAMUSCULAR | Status: DC
  Filled 2012-05-15: qty 20

## 2012-05-15 MED ORDER — GLYCOPYRROLATE 0.2 MG/ML IJ SOLN
INTRAMUSCULAR | Status: DC | PRN
  Administered 2012-05-15: 0.6 mg via INTRAVENOUS

## 2012-05-15 MED ORDER — BISACODYL 10 MG RE SUPP: 10.0000 mg | Freq: Every day | RECTAL | Status: DC | PRN

## 2012-05-15 MED ORDER — FUROSEMIDE 40 MG PO TABS
40.0000 mg | ORAL_TABLET | Freq: Every day | ORAL | Status: DC
  Filled 2012-05-15: qty 1

## 2012-05-15 MED ORDER — OXYCODONE HCL 5 MG PO TABS
5.0000 mg | ORAL_TABLET | ORAL | Status: DC | PRN
  Administered 2012-05-15 – 2012-05-17 (×12): 10 mg via ORAL
  Filled 2012-05-15 (×11): qty 2

## 2012-05-15 MED ORDER — SODIUM CHLORIDE 0.9 % IV SOLN: INTRAVENOUS | Status: DC

## 2012-05-15 MED ORDER — RIVAROXABAN 10 MG PO TABS
10.0000 mg | ORAL_TABLET | Freq: Every day | ORAL | Status: DC
  Administered 2012-05-16 – 2012-05-17 (×2): 10 mg via ORAL
  Filled 2012-05-15 (×4): qty 1

## 2012-05-15 MED ORDER — LACTATED RINGERS IV SOLN: INTRAVENOUS | Status: DC

## 2012-05-15 MED ORDER — STERILE WATER FOR IRRIGATION IR SOLN
Status: DC | PRN
  Administered 2012-05-15: 3000 mL

## 2012-05-15 MED ORDER — PHENYLEPHRINE HCL 10 MG/ML IJ SOLN
INTRAMUSCULAR | Status: DC | PRN
  Administered 2012-05-15 (×2): 80 ug via INTRAVENOUS

## 2012-05-15 MED ORDER — CEFAZOLIN SODIUM 1-5 GM-% IV SOLN
1.0000 g | Freq: Four times a day (QID) | INTRAVENOUS | Status: AC
  Administered 2012-05-15 – 2012-05-16 (×2): 1 g via INTRAVENOUS
  Filled 2012-05-15 (×2): qty 50

## 2012-05-15 MED ORDER — HYDROMORPHONE HCL PF 1 MG/ML IJ SOLN
INTRAMUSCULAR | Status: AC
  Filled 2012-05-15: qty 1

## 2012-05-15 MED ORDER — SPIRONOLACTONE 25 MG PO TABS
25.0000 mg | ORAL_TABLET | Freq: Every day | ORAL | Status: DC
  Filled 2012-05-15: qty 1

## 2012-05-15 MED ORDER — SODIUM CHLORIDE 0.9 % IJ SOLN
INTRAMUSCULAR | Status: DC | PRN
  Administered 2012-05-15: 17:00:00

## 2012-05-15 MED ORDER — ACETAMINOPHEN 650 MG RE SUPP: 650.0000 mg | Freq: Four times a day (QID) | RECTAL | Status: DC | PRN

## 2012-05-15 SURGICAL SUPPLY — 57 items
BAG ZIPLOCK 12X15 (MISCELLANEOUS) ×6 IMPLANT
BIT DRILL 2.8X128 (BIT) ×2 IMPLANT
BLADE EXTENDED COATED 6.5IN (ELECTRODE) ×2 IMPLANT
BLADE SAW SAG 73X25 THK (BLADE) ×1
BLADE SAW SGTL 73X25 THK (BLADE) ×1 IMPLANT
CATH KIT ON-Q SILVERSOAK 5IN (CATHETERS) IMPLANT
CLOTH BEACON ORANGE TIMEOUT ST (SAFETY) ×2 IMPLANT
CONT SPECI 4OZ STER CLIK (MISCELLANEOUS) IMPLANT
DRAPE INCISE IOBAN 66X45 STRL (DRAPES) ×2 IMPLANT
DRAPE ORTHO SPLIT 77X108 STRL (DRAPES) ×2
DRAPE POUCH INSTRU U-SHP 10X18 (DRAPES) ×2 IMPLANT
DRAPE SURG ORHT 6 SPLT 77X108 (DRAPES) ×2 IMPLANT
DRAPE U-SHAPE 47X51 STRL (DRAPES) ×2 IMPLANT
DRSG EMULSION OIL 3X16 NADH (GAUZE/BANDAGES/DRESSINGS) IMPLANT
DRSG MEPILEX BORDER 4X4 (GAUZE/BANDAGES/DRESSINGS) ×4 IMPLANT
DRSG MEPILEX BORDER 4X8 (GAUZE/BANDAGES/DRESSINGS) ×2 IMPLANT
DURAPREP 26ML APPLICATOR (WOUND CARE) ×2 IMPLANT
ELECT REM PT RETURN 9FT ADLT (ELECTROSURGICAL) ×2
ELECTRODE REM PT RTRN 9FT ADLT (ELECTROSURGICAL) ×1 IMPLANT
EVACUATOR 1/8 PVC DRAIN (DRAIN) ×2 IMPLANT
FACESHIELD LNG OPTICON STERILE (SAFETY) ×8 IMPLANT
GLOVE BIO SURGEON STRL SZ7.5 (GLOVE) ×2 IMPLANT
GLOVE BIO SURGEON STRL SZ8 (GLOVE) ×2 IMPLANT
GLOVE BIOGEL PI IND STRL 8 (GLOVE) ×2 IMPLANT
GLOVE BIOGEL PI INDICATOR 8 (GLOVE) ×2
GOWN STRL NON-REIN LRG LVL3 (GOWN DISPOSABLE) ×2 IMPLANT
GOWN STRL REIN 3XL XLG LVL4 (GOWN DISPOSABLE) ×2 IMPLANT
GOWN STRL REIN XL XLG (GOWN DISPOSABLE) ×2 IMPLANT
HEAD M SROM 36MM 2 (Hips) ×1 IMPLANT
IMMOBILIZER KNEE 20 (SOFTGOODS)
IMMOBILIZER KNEE 20 THIGH 36 (SOFTGOODS) IMPLANT
KIT BASIN OR (CUSTOM PROCEDURE TRAY) ×2 IMPLANT
MANIFOLD NEPTUNE II (INSTRUMENTS) ×2 IMPLANT
NDL SAFETY ECLIPSE 18X1.5 (NEEDLE) IMPLANT
NEEDLE HYPO 18GX1.5 SHARP (NEEDLE)
NS IRRIG 1000ML POUR BTL (IV SOLUTION) ×2 IMPLANT
PACK TOTAL JOINT (CUSTOM PROCEDURE TRAY) ×2 IMPLANT
PASSER SUT SWANSON 36MM LOOP (INSTRUMENTS) ×2 IMPLANT
PIN ESC CONSTR ACE LINER 36X56 ×2 IMPLANT
POSITIONER SURGICAL ARM (MISCELLANEOUS) ×2 IMPLANT
SPONGE GAUZE 4X4 12PLY (GAUZE/BANDAGES/DRESSINGS) IMPLANT
SPONGE LAP 18X18 X RAY DECT (DISPOSABLE) ×2 IMPLANT
SROM M HEAD 36MM 2 (Hips) ×2 IMPLANT
STAPLER VISISTAT 35W (STAPLE) ×2 IMPLANT
SUCTION FRAZIER TIP 10 FR DISP (SUCTIONS) ×2 IMPLANT
SUT ETHIBOND NAB CT1 #1 30IN (SUTURE) ×4 IMPLANT
SUT VIC AB 1 CT1 27 (SUTURE) ×3
SUT VIC AB 1 CT1 27XBRD ANTBC (SUTURE) ×3 IMPLANT
SUT VIC AB 2-0 CT1 27 (SUTURE) ×3
SUT VIC AB 2-0 CT1 TAPERPNT 27 (SUTURE) ×3 IMPLANT
SUT VLOC 180 0 24IN GS25 (SUTURE) ×2 IMPLANT
SWAB COLLECTION DEVICE MRSA (MISCELLANEOUS) ×2 IMPLANT
SYR 50ML LL SCALE MARK (SYRINGE) IMPLANT
TOWEL OR 17X26 10 PK STRL BLUE (TOWEL DISPOSABLE) ×4 IMPLANT
TRAY FOLEY CATH 14FRSI W/METER (CATHETERS) ×2 IMPLANT
TUBE ANAEROBIC SPECIMEN COL (MISCELLANEOUS) ×6 IMPLANT
WATER STERILE IRR 1500ML POUR (IV SOLUTION) ×2 IMPLANT

## 2012-05-15 NOTE — Anesthesia Postprocedure Evaluation (Signed)
  Anesthesia Post-op Note  Patient: [name]  Procedure(s) Performed: Procedure(s) (LRB): ACETABULAR REVISION (Right)  Patient Location: PACU  Anesthesia Type: General  Level of Consciousness: awake and alert   Airway and Oxygen Therapy: Patient Spontanous Breathing  Post-op Pain: mild  Post-op Assessment: Post-op Vital signs reviewed, Patient's Cardiovascular Status Stable, Respiratory Function Stable, Patent Airway and No signs of Nausea or vomiting  Last Vitals:  Filed Vitals:   05/15/12 1830  BP: 113/84  Pulse: 61  Temp: 36.4 C  Resp: 17    Post-op Vital Signs: stable   Complications: No apparent anesthesia complications

## 2012-05-15 NOTE — Brief Op Note (Signed)
05/15/2012  5:39 PM  PATIENT:  Johnsie Kindred  52 y.o. male  PRE-OPERATIVE DIAGNOSIS:  failed right total hip arthroplasty   POST-OPERATIVE DIAGNOSIS:  failed right total hip arthroplasty   PROCEDURE:  Procedure(s) with comments: ACETABULAR REVISION (Right) - RIGHT HIP BEARING SURFACE VS ACETABULAR REVISION   SURGEON:  Surgeon(s) and Role:    * Loanne Drilling, MD - Primary  PHYSICIAN ASSISTANT:   ASSISTANTS: [name], PA-C   ANESTHESIA:   general  EBL:  Total I/O In: -  Out: 400 [Urine:200; Blood:200]  BLOOD ADMINISTERED:none  DRAINS: (Medium) Hemovact drain(s) in the right hip with  Suction Open   LOCAL MEDICATIONS USED:  OTHER Exparel  SPECIMEN:  Source of Specimen:  right hip synovium  DISPOSITION OF SPECIMEN:  PATHOLOGY  COUNTS:  YES  TOURNIQUET:  * No tourniquets in log *  DICTATION: .Other Dictation: Dictation Number [id number]  PLAN OF CARE: Admit to inpatient   PATIENT DISPOSITION:  PACU - hemodynamically stable.

## 2012-05-15 NOTE — H&P (View-Only) (Signed)
[name]  DOB: 01-14-1961 Single / Language: [place] / Race: White Male  Date of Admission:  05/15/2012  Chief Complaint:  Right Hip Pain  History of Present Illness The patient is a 52 year old male who comes in for a preoperative History and Physical. The patient is scheduled for a right total hip arthroplasty (revision) at [facility]. The patient is a 52 year old male presenting for a post-operative visit. The patient comes in today 3 years out from right total hip arthroplasty (femoral revision, and 15 years out from left total hip). The patient states that he/she is not doing well at this time. The pain is under poor control at this time and describes their pain as moderate. They are currently on Hydrocodone for their pain. The patient feels that they are progressing poorly at this time. Marland Kitchen He still has significant pain in that right hip. Quite awhile back he had that MARS MRI and had a fluid collection. We called him and we were going to schedule a revision hip but then he opted against it. He's now at a stage where he wants to go ahead and get it fixed. They have been treated conservatively in the past for the above stated problem and despite conservative measures, they continue to have progressive pain and severe functional limitations and dysfunction. They have failed non-operative management. It is felt that they would benefit from undergoing total joint replacement. Risks and benefits of the procedure have been discussed with the patient and they elect to proceed with surgery. There are no active contraindications to surgery such as ongoing infection or rapidly progressive neurological disease.    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 Nausea.   Family History Diabetes Mellitus. mother Heart Disease. father   Social History Pain  Contract. no Previously in rehab. no Number of flights of stairs before winded. less than 1 Living situation. live alone Marital status. single Tobacco / smoke exposure. yes Tobacco use. Current every day smoker. 1/2 pack day current every day smoker; smoke(d) less than 1/2 pack(s) per day Illicit drug use. no No alcohol use Drug/Alcohol Rehab (Currently). no Exercise. Exercises rarely; does running / walking Current work status. disabled Alcohol use. current drinker; drinks beer; only occasionally per week Children. 0 Post-Surgical Plans. Plan is to go home.   Medication History Prevacid (30MG  Capsule DR, Oral) Active. Norco (5-325MG  Tablet, Oral) Active. Xanax (1MG  Tablet, Oral) Active. Xarelto (10MG  Tablet, Oral) Active. (Pateint instructed to stop 24 hours before his surgery.)   Past Surgical History Total Hip Replacement. bilateral   Medical History High blood pressure Atrial Fibrillation. Paroxysmal Congestive Heart Failure  Review of Systems General:Not Present- Chills, Fever, Night Sweats, Fatigue, Weight Gain, Weight Loss and Memory Loss. Skin:Not Present- Hives, Itching, Rash, Eczema and Lesions. HEENT:Not Present- Tinnitus, Headache, Double Vision, Visual Loss, Hearing Loss and Dentures. Respiratory:Not Present- Shortness of breath with exertion, Shortness of breath at rest, Allergies, Coughing up blood and Chronic Cough. Cardiovascular:Not Present- Chest Pain, Racing/skipping heartbeats, Difficulty Breathing Lying Down, Murmur, Swelling and Palpitations. Gastrointestinal:Not Present- Bloody Stool, Heartburn, Abdominal Pain, Vomiting, Nausea, Constipation, Diarrhea, Difficulty Swallowing, Jaundice and Loss of appetitie. Male Genitourinary:Not Present- Urinary frequency, Blood in Urine, Weak urinary stream, Discharge, Flank Pain, Incontinence, Painful Urination, Urgency, Urinary Retention and Urinating at  Night. Musculoskeletal:Present- Joint Pain and Morning Stiffness. Not Present- Muscle Weakness, Muscle Pain, Joint Swelling, Back Pain and Spasms.  Neurological:Not Present- Tremor, Dizziness, Blackout spells, Paralysis, Difficulty with balance and Weakness. Psychiatric:Not Present- Insomnia.   Vitals Weight: 191 lb Height: 70 in Body Surface Area: 2.07 m Body Mass Index: 27.41 kg/m Pulse: 72 (Regular) Resp.: 14 (Unlabored) BP: 168/100 (Sitting, Right Arm, Standard)    Physical Exam The physical exam findings are as follows:   General Mental Status - Alert, cooperative and good historian. General Appearance- pleasant. Not in acute distress. Orientation- Oriented X3. Build & Nutrition- Well nourished and Well developed.   Head and Neck Head- normocephalic, atraumatic . Neck Global Assessment- supple. no bruit auscultated on the right and no bruit auscultated on the left.   Eye Vision- Wears corrective lenses (reading glasses). Pupil- Bilateral- Regular and Round. Motion- Bilateral- EOMI.   Chest and Lung Exam Auscultation: Breath sounds:- clear at anterior chest wall and - clear at posterior chest wall. Adventitious sounds:- No Adventitious sounds.   Cardiovascular Auscultation:Rhythm- Regular rate and rhythm. Heart Sounds- S1 WNL and S2 WNL. Murmurs & Other Heart Sounds:Auscultation of the heart reveals - No Murmurs.   Abdomen Palpation/Percussion:Tenderness- Abdomen is non-tender to palpation. Rigidity (guarding)- Abdomen is soft. Auscultation:Auscultation of the abdomen reveals - Bowel sounds normal.   Male Genitourinary Not done, not pertinent to present illness  Musculoskeletal Global Assessment Gait and Station- antalgic gait (using a cane). On exam, he's alert and oriented, in no apparent distress. His right hip can be flexed to 100. Rotation in 20, out 30, abduction 30 with discomfort on ROM. I  reviewed his  Radiographs: x-rays from last visit. Socket appears to be well fixed. He has a metal on metal hip.  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 Lequita Halt.  Plan is to go home.  PCP - Dr. Junious Dresser, [place],   Signed electronically by Roberts Gaudy, PA-C

## 2012-05-15 NOTE — Anesthesia Preprocedure Evaluation (Addendum)
Anesthesia Evaluation  Patient identified by MRN, date of birth, ID band Patient awake    Reviewed: Allergy & Precautions, H&P , NPO status , Patient's Chart, lab work & pertinent test results, reviewed documented beta blocker date and time   Airway Mallampati: II TM Distance: >3 FB Neck ROM: full    Dental  (+) Chipped and Dental Advisory Given Chipped tooth lower right front and cracked tooth left upper front ( repaired ):   Pulmonary COPDCurrent Smoker,  COPD is mild breath sounds clear to auscultation  Pulmonary exam normal       Cardiovascular hypertension, Pt. on home beta blockers and Pt. on medications +CHF + dysrhythmias Atrial Fibrillation Rhythm:regular Rate:Normal  Cardiomyopathy.  ECG NSR prolonged QT.  CHF x 1 last year because of too much sodium  ? Also AF.  OK now.   Neuro/Psych negative neurological ROS  negative psych ROS   GI/Hepatic negative GI ROS, Neg liver ROS, GERD-  Medicated and Controlled,  Endo/Other  negative endocrine ROS  Renal/GU negative Renal ROS  negative genitourinary   Musculoskeletal   Abdominal   Peds  Hematology negative hematology ROS (+)   Anesthesia Other Findings   Reproductive/Obstetrics negative OB ROS                          Anesthesia Physical Anesthesia Plan  ASA: III  Anesthesia Plan: General   Post-op Pain Management:    Induction: Intravenous  Airway Management Planned: Oral ETT  Additional Equipment:   Intra-op Plan:   Post-operative Plan: Extubation in OR  Informed Consent: I have reviewed the patients History and Physical, chart, labs and discussed the procedure including the risks, benefits and alternatives for the proposed anesthesia with the patient or authorized representative who has indicated his/her understanding and acceptance.   Dental Advisory Given  Plan Discussed with: CRNA and Surgeon  Anesthesia Plan Comments:          Anesthesia Quick Evaluation

## 2012-05-15 NOTE — Transfer of Care (Signed)
Immediate Anesthesia Transfer of Care Note  Patient: [name]  Procedure(s) Performed: Procedure(s) (LRB): ACETABULAR REVISION (Right)  Patient Location: PACU  Anesthesia Type: General  Level of Consciousness: sedated, patient cooperative and responds to stimulaton  Airway & Oxygen Therapy: Patient Spontanous Breathing and Patient connected to face mask oxgen  Post-op Assessment: Report given to PACU RN and Post -op Vital signs reviewed and stable  Post vital signs: Reviewed and stable  Complications: No apparent anesthesia complications

## 2012-05-15 NOTE — Interval H&P Note (Signed)
History and Physical Interval Note:  05/15/2012 4:14 PM  [name]  has presented today for surgery, with the diagnosis of failed right total hip arthroplasty   The various methods of treatment have been discussed with the patient and family. After consideration of risks, benefits and other options for treatment, the patient has consented to  Procedure(s) with comments: ACETABULAR REVISION (Right) - RIGHT HIP BEARING SURFACE VS ACETABULAR REVISION  as a surgical intervention .  The patient's history has been reviewed, patient examined, no change in status, stable for surgery.  I have reviewed the patient's chart and labs.  Questions were answered to the patient's satisfaction.     Loanne Drilling

## 2012-05-15 NOTE — Preoperative (Signed)
Beta Blockers   Reason not to administer Beta Blockers:Not Applicable Pt took beta blocker 05-15-12

## 2012-05-16 LAB — BASIC METABOLIC PANEL
CO2: 28 mEq/L (ref 19–32)
Chloride: 101 mEq/L (ref 96–112)
Sodium: 135 mEq/L (ref 135–145)

## 2012-05-16 LAB — CBC
MCV: 84 fL (ref 78.0–100.0)
Platelets: 273 10*3/uL (ref 150–400)
RBC: 3.99 MIL/uL — ABNORMAL LOW (ref 4.22–5.81)
WBC: 9.1 10*3/uL (ref 4.0–10.5)

## 2012-05-16 MED ORDER — CARVEDILOL 6.25 MG PO TABS
6.2500 mg | ORAL_TABLET | Freq: Every day | ORAL | Status: DC
  Filled 2012-05-16: qty 1

## 2012-05-16 MED ORDER — SODIUM CHLORIDE 0.9 % IV BOLUS (SEPSIS)
250.0000 mL | Freq: Once | INTRAVENOUS | Status: AC
  Administered 2012-05-16: 250 mL via INTRAVENOUS

## 2012-05-16 MED ORDER — ALUM & MAG HYDROXIDE-SIMETH 200-200-20 MG/5ML PO SUSP
30.0000 mL | ORAL | Status: DC | PRN
  Administered 2012-05-16 – 2012-05-17 (×2): 30 mL via ORAL
  Filled 2012-05-16 (×2): qty 30

## 2012-05-16 NOTE — Progress Notes (Signed)
   Subjective: 1 Day Post-Op Procedure(s) (LRB): ACETABULAR REVISION (Right) Patient reports pain as mild.   Patient seen in rounds with Dr. Lequita Halt. Briefly discussed the surgical findings with Dr. Lequita Halt. Patient is well, and has had no acute complaints or problems We will start therapy today.  Plan is to go Home after hospital stay.  The patient did have an episode last night around midnight when he was noticed to have an increased heart rate.  He had an EKG that was reported to be AFib with RVR and a pulse rate of 148 by EKG strip.  His vitals have been fairly stable this morning.  Most recent pulse rate was 50 and blood pressure was 103/67 which has been lower that yesterday.  Will recheck his EKG to ensure back in sinus rhythm and following the pressures.  Objective: Vital signs in last 24 hours: Temp:  [97.5 F (36.4 C)-97.7 F (36.5 C)] 97.6 F (36.4 C) (04/03 0454) Pulse Rate:  [57-97] 57 (04/03 0638) Resp:  [16-28] 16 (04/03 0638) BP: (87-161)/(53-111) 87/53 mmHg (04/03 0638) SpO2:  [94 %-100 %] 96 % (04/03 0981) Weight:  [87.544 kg (193 lb)] 87.544 kg (193 lb) (04/02 1900)  Intake/Output from previous day:  Intake/Output Summary (Last 24 hours) at 05/16/12 1007 Last data filed at 05/16/12 0940  Gross per 24 hour  Intake   2330 ml  Output   1030 ml  Net   1300 ml    Intake/Output this shift: Total I/O In: 240 [P.O.:240] Out: 100 [Urine:100]  Labs:  Recent Labs  05/16/12 0400  HGB 11.1*    Recent Labs  05/16/12 0400  WBC 9.1  RBC 3.99*  HCT 33.5*  PLT 273    Recent Labs  05/16/12 0400  NA 135  K 4.1  CL 101  CO2 28  BUN 12  CREATININE 0.92  GLUCOSE 130*  CALCIUM 8.3*   No results found for this basename: LABPT, INR,  in the last 72 hours  EXAM General - Patient is Alert, Appropriate and Oriented Extremity - Neurovascular intact Sensation intact distally Dorsiflexion/Plantar flexion intact Dressing - dressing C/D/I Motor Function -  intact, moving foot and toes well on exam.  Hemovac pulled without difficulty.  Past Medical History  Diagnosis Date  . COPD (chronic obstructive pulmonary disease)   . Dysphagia     with esophageal stricture  . Dysrhythmia     atrial fib  . Cardiomyopathy   . CHF (congestive heart failure)   . Hypertension   . GERD (gastroesophageal reflux disease)   . Anxiety   . History of blood transfusion   . Arthritis     Assessment/Plan: 1 Day Post-Op Procedure(s) (LRB): ACETABULAR REVISION (Right) Principal Problem:   Failed total hip arthroplasty  Estimated body mass index is 26.55 kg/(m^2) as calculated from the following:   Height as of this encounter: 5' 11.5" (1.816 m).   Weight as of this encounter: 87.544 kg (193 lb). Advance diet Up with therapy Plan for discharge tomorrow Discharge home with home health  DVT Prophylaxis - Xarelto 10 mg daily while here in house.  He takes 20 mg dose at home and will resume that dosing regimen at time of discharge. Weight Bearing As Tolerated right Leg Hemovac Pulled Begin Therapy No vaccines.  PERKINS, ALEXZANDREW 05/16/2012, 10:07 AM

## 2012-05-16 NOTE — Op Note (Signed)
[name], [name] NO.:  [id number]  MEDICAL RECORD NO.:  [id number]  LOCATION:  1614                         FACILITY:  [facility]  PHYSICIAN:  Ollen Gross, M.D.    DATE OF BIRTH:  [birth year]  DATE OF PROCEDURE:  05/15/2012 DATE OF DISCHARGE:                              OPERATIVE REPORT   PREOPERATIVE DIAGNOSIS:  Failed right total hip arthroplasty.  POSTOPERATIVE DIAGNOSIS:  Failed right total hip arthroplasty.  PROCEDURE:  Right acetabular revision to constrain liner.  SURGEON:  Ollen Gross, M.D.  ASSISTANT:  Alexzandrew L. Perkins, P.A.C.  ANESTHESIA:  General.  ESTIMATED BLOOD LOSS:  200.  DRAINS:  Hemovac x1.  COMPLICATIONS:  None.  CONDITION:  Stable to recovery.  BRIEF CLINICAL NOTE:  [name] is a 52 year old male with long complex history in regards to his right hip.  He had a conversion from fused hip to right total hip arthroplasty many years ago.  Few years back, he had problems with dislocations, and we converted him to a 40-mm metal on metal construct.  He did well initially, but has had progressive pain. 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 He presents now for revision of the metal on metal to a metal on polyethylene construct.  Given his prior history of dislocation, it was felt that he would most likely need to be converted to a constrained liner for stability purposes, especially since his abductors are not functional from his previous fusion.  PROCEDURE IN DETAIL:  After successful administration of general anesthetic, the patient was placed in the left lateral decubitus position with the right side up and held with the hip positioner.  Right lower extremity was isolated from his perineum with plastic drapes and prepped and draped in the usual sterile fashion.  Posterolateral incision was made through the skin and subcutaneous tissue to the level of fascia lata, which was incised in line with  the skin incision.  There was a small amount of fluid present, which was clear, sent for stat Gram stain, which was negative.  We sent some pericapsular tissue for pathologic analysis also.  There was no metal staining of the tissue. No evidence of any severe inflammatory reaction in the hip.  We subsequently removed the soft tissue from around the acetabular shell and dislocated the hip.  It was a 40-mm head.  The head was removed off of the femoral neck.  There was minimal corrosion in the trunnion.  The femur was subsequently retracted anteriorly to gain acetabular exposure. Acetabular retractors were placed.  I had to remove some alleged bony overgrowth over the superior part of the acetabular shell.  Once I did this, I was easily able to gain access to the shell and was able to remove the metal liner from the acetabular component.  The acetabular component was well fixed and in excellent position.  The liner did not show any gross wear or any significant scratching.  It was a 56-mm shell.  We then cleared the soft tissue around the rim of the acetabular shell and placed the 36-mm neutral +4 constrained liner into the shell.  I impacted it and circumferentially checked and that it was well  fixed and completely reduced.  We then placed the 36 -2 femoral head onto the femoral neck.  There was no significant damage to the trunnion, as I felt it was fine to place a metal head.  The hip was then reduced and snapped into place.  The locking ring was then snapped into position.  Very stable range of motion throughout full range.  The wound was then copiously irrigated with saline solution, and the fascia lata was then closed over a drain with running #1 V-Loc suture.  Subcu was closed with interrupted 2-0 Vicryl and skin with staples.  The drain was hooked to suction. Incision was cleaned and dried, and a bulky sterile dressing was applied.  He was awakened and transported to recovery in  stable condition.  Please note that a surgical assistant was a medical necessity for this procedure in order to perform it in a safe and expeditious manner. Surgical assistant was necessary for protection of vital neurovascular structures, also proper positioning of the limb in order to remove the acetabular liner and then to place the constrained liner and also to reduce the hip carefully without damaging the liner.     Ollen Gross, M.D.     FA/MEDQ  D:  05/15/2012  T:  05/16/2012  Job:  [id number]

## 2012-05-16 NOTE — Progress Notes (Signed)
Physical Therapy Treatment Patient Details Name: Najeeb Uptain MRN: [id number] DOB: 30-Nov-1960 Today's Date: 05/16/2012 Time: 6578-4696 PT Time Calculation (min): 18 min  PT Assessment / Plan / Recommendation Comments on Treatment Session       Follow Up Recommendations  Home health PT     Does the patient have the potential to tolerate intense rehabilitation     Barriers to Discharge        Equipment Recommendations       Recommendations for Other Services OT consult  Frequency 7X/week   Plan      Precautions / Restrictions Precautions Precautions: Posterior Hip Restrictions Weight Bearing Restrictions: No   Pertinent Vitals/Pain     Mobility  Transfers Transfers: Sit to Stand;Stand to Sit Sit to Stand: 4: Min guard Stand to Sit: 4: Min guard Details for Transfer Assistance: min cues for LE management and use of UEs to self assist Ambulation/Gait Ambulation/Gait Assistance: 4: Min guard Ambulation Distance (Feet): 333 Feet Assistive device: Crutches Gait Pattern: Step-through pattern General Gait Details: Pt demonstrating excellent use of crutches  Stairs: Yes Stairs Assistance: 4: Min guard Stair Management Technique: No rails;Forwards;With crutches;Step to pattern Number of Stairs: 4    Exercises     PT Diagnosis:    PT Problem List:   PT Treatment Interventions:     PT Goals Acute Rehab PT Goals PT Goal Formulation: With patient Time For Goal Achievement: 05/21/12 Potential to Achieve Goals: Good Pt will go Supine/Side to Sit: with modified independence PT Goal: Supine/Side to Sit - Progress: Goal set today Pt will go Sit to Supine/Side: with modified independence PT Goal: Sit to Supine/Side - Progress: Goal set today Pt will go Sit to Stand: with modified independence PT Goal: Sit to Stand - Progress: Progressing toward goal Pt will go Stand to Sit: with modified independence PT Goal: Stand to Sit - Progress: Progressing toward goal Pt will  Ambulate: >150 feet;with supervision;with least restrictive assistive device PT Goal: Ambulate - Progress: Progressing toward goal  Visit Information  Last PT Received On: 05/16/12 Assistance Needed: +1    Subjective Data  Subjective: I'm not putting a lot of weight on this R leg - its 3" shorter than the other one so it has trouble reaching the floor Patient Stated Goal: Resume previous lifestyle with decreased pain   Cognition  Cognition Overall Cognitive Status: Appears within functional limits for tasks assessed/performed Arousal/Alertness: Awake/alert Orientation Level: Appears intact for tasks assessed Behavior During Session: [facility] for tasks performed    Balance     End of Session PT - End of Session Equipment Utilized During Treatment: Gait belt Activity Tolerance: Patient tolerated treatment well Patient left: in chair;with call bell/phone within reach Nurse Communication: Mobility status   GP     Kabrina Christiano 05/16/2012, 3:51 PM

## 2012-05-16 NOTE — Progress Notes (Signed)
Utilization review completed.  

## 2012-05-16 NOTE — Evaluation (Signed)
Physical Therapy Evaluation Patient Details Name: [name] MRN: [id number] DOB: 1961-01-31 Today's Date: 05/16/2012 Time: 1324-4010 PT Time Calculation (min): 25 min  PT Assessment / Plan / Recommendation Clinical Impression  Pt s/p R THR revision presents with decreased R LE strength/ROM, post THP and post op pain limiting functional mobility    PT Assessment  Patient needs continued PT services    Follow Up Recommendations  Home health PT    Does the patient have the potential to tolerate intense rehabilitation      Barriers to Discharge None      Equipment Recommendations       Recommendations for Other Services OT consult   Frequency 7X/week    Precautions / Restrictions Precautions Precautions: Posterior Hip Restrictions Weight Bearing Restrictions: No   Pertinent Vitals/Pain 6/10; premed, RN aware, ice pack provided      Mobility  Bed Mobility Bed Mobility: Supine to Sit Supine to Sit: 4: Min assist Details for Bed Mobility Assistance: min cues for use of R LE to self assist Transfers Transfers: Sit to Stand;Stand to Sit Sit to Stand: 4: Min assist Stand to Sit: 4: Min assist Details for Transfer Assistance: min cues for LE management and use of UEs to self assist Ambulation/Gait Ambulation/Gait Assistance: 4: Min guard;4: Min Environmental consultant (Feet): 141 Feet Assistive device: Rolling walker Ambulation/Gait Assistance Details: min cues for position from RW and to decrease pace Gait Pattern: Step-to pattern;Step-through pattern Stairs: No    Exercises Total Joint Exercises Ankle Circles/Pumps: AROM;15 reps;Supine;Both Quad Sets: AROM;10 reps;Supine;Both Heel Slides: AAROM;15 reps;Supine;Right Hip ABduction/ADduction: AAROM;10 reps;Supine;Right   PT Diagnosis: Difficulty walking  PT Problem List: Decreased strength;Decreased range of motion;Decreased activity tolerance;Decreased mobility;Decreased knowledge of use of  DME;Pain;Decreased knowledge of precautions PT Treatment Interventions: DME instruction;Gait training;Stair training;Functional mobility training;Therapeutic activities;Therapeutic exercise;Patient/family education   PT Goals Acute Rehab PT Goals PT Goal Formulation: With patient Time For Goal Achievement: 05/21/12 Potential to Achieve Goals: Good Pt will go Supine/Side to Sit: with modified independence PT Goal: Supine/Side to Sit - Progress: Goal set today Pt will go Sit to Supine/Side: with modified independence PT Goal: Sit to Supine/Side - Progress: Goal set today Pt will go Sit to Stand: with modified independence PT Goal: Sit to Stand - Progress: Goal set today Pt will go Stand to Sit: with modified independence PT Goal: Stand to Sit - Progress: Goal set today Pt will Ambulate: >150 feet;with supervision;with least restrictive assistive device PT Goal: Ambulate - Progress: Goal set today  Visit Information  Last PT Received On: 05/16/12 Assistance Needed: +1    Subjective Data  Subjective: I've been through this so many times Patient Stated Goal: Resume previous lifestyle with decreased pain   Prior Functioning  Home Living Lives With: Alone Available Help at Discharge: Friend(s) Type of Home: Mobile home Home Access: Ramped entrance Home Layout: One level Home Adaptive Equipment: Straight cane;Crutches Prior Function Level of Independence: Independent;Independent with assistive device(s) Able to Take Stairs?: Yes Driving: Yes Vocation: On disability Communication Communication: No difficulties    Cognition  Cognition Overall Cognitive Status: Appears within functional limits for tasks assessed/performed Arousal/Alertness: Awake/alert Orientation Level: Appears intact for tasks assessed Behavior During Session: [facility] for tasks performed    Extremity/Trunk Assessment Right Upper Extremity Assessment RUE ROM/Strength/Tone: [facility] for tasks assessed Left Upper  Extremity Assessment LUE ROM/Strength/Tone: WFL for tasks assessed Right Lower Extremity Assessment RLE ROM/Strength/Tone: Deficits RLE ROM/Strength/Tone Deficits: Hip strength 2+/5 with AAROM to 80 flex and 15  abd Left Lower Extremity Assessment LLE ROM/Strength/Tone: WFL for tasks assessed   Balance    End of Session PT - End of Session Equipment Utilized During Treatment: Gait belt Activity Tolerance: Patient tolerated treatment well Patient left: in chair;with call bell/phone within reach Nurse Communication: Mobility status  GP     Santia Labate 05/16/2012, 11:00 AM

## 2012-05-16 NOTE — Progress Notes (Signed)
Late entry:  At 2235, VS for this patient were reported by the NT as: BP:134/95 and a HR that was fluctuating between 90-120s.  Upon assessment, the patient denied any chest pain or SOB.  Ascultation of the heart was irregular which was consistent with the initial assessment.  2340:  The patient's HR was ranging from 90-135.  The patient stated he felt 'weird' and that he had never felt this way before.  I requested the NT to obtain an EKG and placed a call to [employer], PA-C.  MN:  The EKG showed Atrial fibrillation with rapid ventricular response.  This information, along with a brief overview of the patient's history was conveyed to the PA.  No new orders were given.  The PA stated he would review the patient's chart and call back.  0020:  Return call from [facility], [place].  No new orders given.  We will continue to observe the patient and call PA back if patient becomes symptomatic.  HR now 90s-112.  The patient reports feeling much better.  0123:  C/o hip pain.  Oxycodone 10mg  IR given.  Pt denies SOB or chest pain.  HR now in the 60s and steady.

## 2012-05-17 ENCOUNTER — Encounter (HOSPITAL_COMMUNITY): Payer: Self-pay | Admitting: Orthopedic Surgery

## 2012-05-17 LAB — BASIC METABOLIC PANEL
Chloride: 99 mEq/L (ref 96–112)
GFR calc Af Amer: >90 mL/min (ref >90)
GFR calc non Af Amer: >90 mL/min (ref >90)
Potassium: 4.4 mEq/L (ref 3.5–5.1)
Sodium: 135 mEq/L (ref 135–145)

## 2012-05-17 LAB — CBC
HCT: 35.3 % — ABNORMAL LOW (ref 39.0–52.0)
Hemoglobin: 11.7 g/dL — ABNORMAL LOW (ref 13.0–17.0)
MCHC: 33.1 g/dL (ref 30.0–36.0)
RDW: 16.4 % — ABNORMAL HIGH (ref 11.5–15.5)
WBC: 18.2 10*3/uL — ABNORMAL HIGH (ref 4.0–10.5)

## 2012-05-17 MED ORDER — TRAMADOL HCL 50 MG PO TABS: 50.0000 mg | ORAL_TABLET | Freq: Four times a day (QID) | ORAL | Status: DC | PRN

## 2012-05-17 MED ORDER — OXYCODONE HCL 5 MG PO TABS: 5.0000 mg | ORAL_TABLET | ORAL | Status: DC | PRN

## 2012-05-17 MED ORDER — METHOCARBAMOL 500 MG PO TABS: 500.0000 mg | ORAL_TABLET | Freq: Four times a day (QID) | ORAL | Status: DC | PRN

## 2012-05-17 NOTE — Progress Notes (Signed)
   Subjective: 2 Days Post-Op Procedure(s) (LRB): ACETABULAR REVISION (Right) Patient reports pain as mild.   Patient seen in rounds with Dr. Lequita Halt. Patient is well, but has had some minor complaints of pain in the hip, requiring pain medications Patient is ready to go home later today.  Objective: Vital signs in last 24 hours: Temp:  [97.6 F (36.4 C)-98 F (36.7 C)] 97.8 F (36.6 C) (04/04 0614) Pulse Rate:  [58-67] 67 (04/04 0614) Resp:  [16-18] 16 (04/04 0614) BP: (100-146)/(66-92) 146/92 mmHg (04/04 0614) SpO2:  [93 %-96 %] 95 % (04/04 0614)  Intake/Output from previous day:  Intake/Output Summary (Last 24 hours) at 05/17/12 0910 Last data filed at 05/17/12 0600  Gross per 24 hour  Intake 2012.67 ml  Output   2425 ml  Net -412.33 ml    Intake/Output this shift:    Labs:  Recent Labs  05/16/12 0400 05/17/12 0410  HGB 11.1* 11.7*    Recent Labs  05/16/12 0400 05/17/12 0410  WBC 9.1 18.2*  RBC 3.99* 4.22  HCT 33.5* 35.3*  PLT 273 335    Recent Labs  05/16/12 0400 05/17/12 0410  NA 135 135  K 4.1 4.4  CL 101 99  CO2 28 28  BUN 12 14  CREATININE 0.92 0.74  GLUCOSE 130* 146*  CALCIUM 8.3* 9.0   No results found for this basename: LABPT, INR,  in the last 72 hours  EXAM: General - Patient is Alert, Appropriate and Oriented Extremity - Neurovascular intact Sensation intact distally Dorsiflexion/Plantar flexion intact No cellulitis present Incision - clean, dry, no drainage, healing Motor Function - intact, moving foot and toes well on exam.   Assessment/Plan: 2 Days Post-Op Procedure(s) (LRB): ACETABULAR REVISION (Right) Procedure(s) (LRB): ACETABULAR REVISION (Right) Past Medical History  Diagnosis Date  . COPD (chronic obstructive pulmonary disease)   . Dysphagia     with esophageal stricture  . Dysrhythmia     atrial fib  . Cardiomyopathy   . CHF (congestive heart failure)   . Hypertension   . GERD (gastroesophageal reflux  disease)   . Anxiety   . History of blood transfusion   . Arthritis    Principal Problem:   Failed total hip arthroplasty  Estimated body mass index is 26.55 kg/(m^2) as calculated from the following:   Height as of this encounter: 5' 11.5" (1.816 m).   Weight as of this encounter: 87.544 kg (193 lb). Up with therapy Discharge home with home health Diet - Cardiac diet Follow up - in 2 weeks Activity - WBAT Disposition - Home Condition Upon Discharge - Good D/C Meds - See DC Summary DVT Prophylaxis - Xarelto  PERKINS, ALEXZANDREW 05/17/2012, 9:10 AM

## 2012-05-17 NOTE — Progress Notes (Signed)
Physical Therapy Treatment Patient Details Name: Peterson Mathey MRN: [id number] DOB: 01/07/1961 Today's Date: 05/17/2012 Time: 1125-1150 PT Time Calculation (min): 25 min  PT Assessment / Plan / Recommendation Comments on Treatment Session       Follow Up Recommendations  Home health PT     Does the patient have the potential to tolerate intense rehabilitation     Barriers to Discharge        Equipment Recommendations  Rolling walker with 5" wheels    Recommendations for Other Services OT consult  Frequency 7X/week   Plan Discharge plan remains appropriate    Precautions / Restrictions Precautions Precautions: Posterior Hip Restrictions Weight Bearing Restrictions: No   Pertinent Vitals/Pain     Mobility  Bed Mobility Bed Mobility: Supine to Sit Supine to Sit: 6: Modified independent (Device/Increase time) Transfers Transfers: Sit to Stand;Stand to Sit Sit to Stand: 5: Supervision Stand to Sit: 5: Supervision Details for Transfer Assistance: min cues for LE management and use of UEs to self assist Ambulation/Gait Ambulation/Gait Assistance: 5: Supervision Ambulation Distance (Feet): 25 Feet Assistive device: Rolling walker Ambulation/Gait Assistance Details: min cues for pace Gait Pattern: Step-through pattern    Exercises Total Joint Exercises Ankle Circles/Pumps: AROM;15 reps;Supine;Both Quad Sets: AROM;10 reps;Supine;Both Gluteal Sets: AROM;Both;10 reps;Supine Heel Slides: AAROM;15 reps;Supine;Right Hip ABduction/ADduction: AAROM;Supine;Right;15 reps Long Arc Quad: AROM;Both;15 reps;Seated   PT Diagnosis:    PT Problem List:   PT Treatment Interventions:     PT Goals Acute Rehab PT Goals PT Goal Formulation: With patient Time For Goal Achievement: 05/21/12 Potential to Achieve Goals: Good Pt will go Supine/Side to Sit: with modified independence PT Goal: Supine/Side to Sit - Progress: Met Pt will go Sit to Supine/Side: with modified  independence PT Goal: Sit to Supine/Side - Progress: Progressing toward goal Pt will go Sit to Stand: with modified independence PT Goal: Sit to Stand - Progress: Progressing toward goal Pt will go Stand to Sit: with modified independence PT Goal: Stand to Sit - Progress: Progressing toward goal Pt will Ambulate: >150 feet;with supervision;with least restrictive assistive device PT Goal: Ambulate - Progress: Progressing toward goal  Visit Information  Last PT Received On: 05/17/12 Assistance Needed: +1    Subjective Data  Subjective: I'm going home today Patient Stated Goal: Resume previous lifestyle with decreased pain   Cognition  Cognition Overall Cognitive Status: Appears within functional limits for tasks assessed/performed Arousal/Alertness: Awake/alert Orientation Level: Appears intact for tasks assessed Behavior During Session: [facility] for tasks performed    Balance     End of Session PT - End of Session Equipment Utilized During Treatment: Gait belt Activity Tolerance: Patient tolerated treatment well Patient left: in chair;with call bell/phone within reach Nurse Communication: Mobility status   GP     Rodolph Hagemann 05/17/2012, 12:41 PM

## 2012-05-17 NOTE — Progress Notes (Signed)
OT Cancellation Note  Patient Details Name: [name] MRN: [id number] DOB: 09/19/1960   Cancelled Treatment:    Reason Eval/Treat Not Completed: Other (comment) (pt states he doesnt feel he needs OT and has all DME. He states he has had multiple hip surgeries in the past and is familiar with the ADL. He does request a long handle sponge and reacher as he no longer has his. He states he knows how to use AE. Note pt had Medicaid and PT supervisor to bring AE to pt.)  Lennox Laity [phone] 05/17/2012, 12:06 PM

## 2012-05-17 NOTE — Care Management Note (Signed)
    Page 1 of 2   05/17/2012     11:13:17 AM   CARE MANAGEMENT NOTE 05/17/2012  Patient:  [name], [name]   Account Number:  [id number]  Date Initiated:  05/17/2012  Documentation initiated by:  Colleen Can  Subjective/Objective Assessment:   DX FAILED RT TOTAL HIP ARTHROPLASTY; revision total hip     Action/Plan:   CM spoke with patient. Plans are for him to reurn to [facility] where he will stay with friend who will be caregiver. He will need RW   Anticipated DC Date:  05/17/2012   Anticipated DC Plan:  HOME W HOME HEALTH SERVICES      DC Planning Services  CM consult      [facility] Choice  HOME HEALTH  DURABLE MEDICAL EQUIPMENT   Choice offered to / List presented to:  C-1 Patient   DME arranged  Levan Hurst      DME agency  [facility]     HH arranged  HH-2 PT      [facility] agency  [facility]   Status of service:  Completed, signed off Medicare Important Message given?  NO (If response is "NO", the following Medicare IM given date fields will be blank) Date Medicare IM given:   Date Additional Medicare IM given:    Discharge Disposition:  HOME W HOME HEALTH SERVICES  Per UR Regulation:    If discussed at Long Length of Stay Meetings, dates discussed:    Comments:  05/17/2012 Colleen Can BSN RN CCM [phone] [facility] services will start tomorrow 05/18/2012. Contact information goiven to patient.

## 2012-05-18 LAB — WOUND CULTURE: Culture: NO GROWTH

## 2012-05-20 LAB — ANAEROBIC CULTURE

## 2012-06-06 NOTE — Discharge Summary (Signed)
Physician Discharge Summary   Patient ID: [name] MRN: [id number] DOB/AGE: 10/26/1960 52 y.o.  Admit date: 05/15/2012 Discharge date: 05/17/2012  Primary Diagnosis: Failed total hip arthroplasty, left hip  Admission Diagnoses:  Past Medical History  Diagnosis Date  . COPD (chronic obstructive pulmonary disease)   . Dysphagia     with esophageal stricture  . Dysrhythmia     atrial fib  . Cardiomyopathy   . CHF (congestive heart failure)   . Hypertension   . GERD (gastroesophageal reflux disease)   . Anxiety   . History of blood transfusion   . Arthritis    Discharge Diagnoses:   Principal Problem:   Failed total hip arthroplasty  Estimated body mass index is 26.55 kg/(m^2) as calculated from the following:   Height as of this encounter: 5' 11.5" (1.816 m).   Weight as of this encounter: 87.544 kg (193 lb).  Procedure(s) (LRB): ACETABULAR REVISION (Right)   Consults: None  HPI: The patient is a 52 year old male who comes in for a preoperative History and Physical. The patient is scheduled for a right total hip arthroplasty (revision) at [facility].  The patient is a 52 year old male presenting for a post-operative visit. The patient comes in today 3 years out from right total hip arthroplasty (femoral revision, and 15 years out from left total hip). The patient states that he/she is not doing well at this time. The pain is under poor control at this time and describes their pain as moderate. They are currently on Hydrocodone for their pain. The patient feels that they are progressing poorly at this time. [name]  He still has significant pain in that right hip. Quite awhile back he had that MARS MRI and had a fluid collection. We called him and we were going to schedule a revision hip but then he opted against it. He's now at a stage where he wants to go ahead and get it fixed.  They have been treated conservatively in the past for the above stated problem and despite  conservative measures, they continue to have progressive pain and severe functional limitations and dysfunction. They have failed non-operative management. It is felt that they would benefit from undergoing total joint replacement. Risks and benefits of the procedure have been discussed with the patient and they elect to proceed with surgery. There are no active contraindications to surgery such as ongoing infection or rapidly progressive neurological disease.  Laboratory Data: Admission on 05/15/2012, Discharged on 05/17/2012  Component Date Value Range Status  . Specimen Description 05/15/2012 HIP LEFT   Final  . Special Requests 05/15/2012 NONE   Final  . Gram Stain 05/15/2012    Final                   Value:RARE WBC PRESENT, PREDOMINANTLY PMN                         NO ORGANISMS SEEN                         Gram Stain Report Called to,Read Back By and Verified With: Gram Stain Report Called to,Read Back By and Verified With: DR ALUSIO 1757 05/15/12 BY LOVE T Performed by [facility]  . Culture 05/15/2012 NO GROWTH 2 DAYS   Final  . Report Status 05/15/2012 05/18/2012 FINAL   Final  . Specimen Description 05/15/2012 HIP LEFT  Final  . Special Requests 05/15/2012 NONE   Final  . Gram Stain 05/15/2012    Final                   Value:RARE WBC PRESENT, PREDOMINANTLY PMN                         NO ORGANISMS SEEN                         Gram Stain Report Called to,Read Back By and Verified With: DR. Despina Hick AT 1757 ON [id number] BY LOVE,T,  . Report Status 05/15/2012 05/15/2012 FINAL   Final  . Specimen Description 05/15/2012 HIP LEFT   Final  . Special Requests 05/15/2012 NONE   Final  . Gram Stain 05/15/2012    Final                   Value:RARE WBC PRESENT, PREDOMINANTLY PMN                         NO ORGANISMS SEEN  . Culture 05/15/2012 NO ANAEROBES ISOLATED   Final  . Report Status 05/15/2012 05/20/2012 FINAL   Final  . WBC 05/16/2012 9.1  4.0 - 10.5 K/uL Final  . RBC 05/16/2012  3.99* 4.22 - 5.81 MIL/uL Final  . Hemoglobin 05/16/2012 11.1* 13.0 - 17.0 g/dL Final  . HCT 04/54/0981 33.5* 39.0 - 52.0 % Final  . MCV 05/16/2012 84.0  78.0 - 100.0 fL Final  . MCH 05/16/2012 27.8  26.0 - 34.0 pg Final  . MCHC 05/16/2012 33.1  30.0 - 36.0 g/dL Final  . RDW 19/14/7829 16.4* 11.5 - 15.5 % Final  . Platelets 05/16/2012 273  150 - 400 K/uL Final  . Sodium 05/16/2012 135  135 - 145 mEq/L Final  . Potassium 05/16/2012 4.1  3.5 - 5.1 mEq/L Final  . Chloride 05/16/2012 101  96 - 112 mEq/L Final  . CO2 05/16/2012 28  19 - 32 mEq/L Final  . Glucose, Bld 05/16/2012 130* 70 - 99 mg/dL Final  . BUN 56/21/3086 12  6 - 23 mg/dL Final  . Creatinine, Ser 05/16/2012 0.92  0.50 - 1.35 mg/dL Final  . Calcium 57/84/6962 8.3* 8.4 - 10.5 mg/dL Final  . GFR calc non Af Amer 05/16/2012 >90  >90 mL/min Final  . GFR calc Af Amer 05/16/2012 >90  >90 mL/min Final   Comment:                                 The eGFR has been calculated                          using the CKD EPI equation.                          This calculation has not been                          validated in all clinical                          situations.  eGFR's persistently                          <90 mL/min signify                          possible Chronic Kidney Disease.  . WBC 05/17/2012 18.2* 4.0 - 10.5 K/uL Final  . RBC 05/17/2012 4.22  4.22 - 5.81 MIL/uL Final  . Hemoglobin 05/17/2012 11.7* 13.0 - 17.0 g/dL Final  . HCT 11/91/4782 35.3* 39.0 - 52.0 % Final  . MCV 05/17/2012 83.6  78.0 - 100.0 fL Final  . MCH 05/17/2012 27.7  26.0 - 34.0 pg Final  . MCHC 05/17/2012 33.1  30.0 - 36.0 g/dL Final  . RDW 95/62/1308 16.4* 11.5 - 15.5 % Final  . Platelets 05/17/2012 335  150 - 400 K/uL Final  . Sodium 05/17/2012 135  135 - 145 mEq/L Final  . Potassium 05/17/2012 4.4  3.5 - 5.1 mEq/L Final  . Chloride 05/17/2012 99  96 - 112 mEq/L Final  . CO2 05/17/2012 28  19 - 32 mEq/L Final  . Glucose,  Bld 05/17/2012 146* 70 - 99 mg/dL Final  . BUN 65/78/4696 14  6 - 23 mg/dL Final  . Creatinine, Ser 05/17/2012 0.74  0.50 - 1.35 mg/dL Final  . Calcium 29/52/8413 9.0  8.4 - 10.5 mg/dL Final  . GFR calc non Af Amer 05/17/2012 >90  >90 mL/min Final  . GFR calc Af Amer 05/17/2012 >90  >90 mL/min Final   Comment:                                 The eGFR has been calculated                          using the CKD EPI equation.                          This calculation has not been                          validated in all clinical                          situations.                          eGFR's persistently                          <90 mL/min signify                          possible Chronic Kidney Disease.  Hospital Outpatient Visit on 05/08/2012  Component Date Value Range Status  . aPTT 05/08/2012 40* 24 - 37 seconds Final   Comment:                                 IF BASELINE aPTT IS ELEVATED,                          SUGGEST PATIENT RISK ASSESSMENT  BE USED TO DETERMINE APPROPRIATE                          ANTICOAGULANT THERAPY.  . WBC 05/08/2012 7.5  4.0 - 10.5 K/uL Final  . RBC 05/08/2012 4.73  4.22 - 5.81 MIL/uL Final  . Hemoglobin 05/08/2012 12.9* 13.0 - 17.0 g/dL Final  . HCT 16/11/9602 38.5* 39.0 - 52.0 % Final  . MCV 05/08/2012 81.4  78.0 - 100.0 fL Final  . MCH 05/08/2012 27.3  26.0 - 34.0 pg Final  . MCHC 05/08/2012 33.5  30.0 - 36.0 g/dL Final  . RDW 54/10/8117 15.8* 11.5 - 15.5 % Final  . Platelets 05/08/2012 294  150 - 400 K/uL Final  . Sodium 05/08/2012 135  135 - 145 mEq/L Final  . Potassium 05/08/2012 3.7  3.5 - 5.1 mEq/L Final  . Chloride 05/08/2012 98  96 - 112 mEq/L Final  . CO2 05/08/2012 27  19 - 32 mEq/L Final  . Glucose, Bld 05/08/2012 83  70 - 99 mg/dL Final  . BUN 14/78/2956 8  6 - 23 mg/dL Final  . Creatinine, Ser 05/08/2012 0.83  0.50 - 1.35 mg/dL Final  . Calcium 21/30/8657 8.9  8.4 - 10.5 mg/dL Final  . Total Protein  05/08/2012 7.5  6.0 - 8.3 g/dL Final  . Albumin 84/69/6295 3.4* 3.5 - 5.2 g/dL Final  . AST 28/41/3244 13  0 - 37 U/L Final  . ALT 05/08/2012 9  0 - 53 U/L Final  . Alkaline Phosphatase 05/08/2012 94  39 - 117 U/L Final  . Total Bilirubin 05/08/2012 0.5  0.3 - 1.2 mg/dL Final  . GFR calc non Af Amer 05/08/2012 >90  >90 mL/min Final  . GFR calc Af Amer 05/08/2012 >90  >90 mL/min Final   Comment:                                 The eGFR has been calculated                          using the CKD EPI equation.                          This calculation has not been                          validated in all clinical                          situations.                          eGFR's persistently                          <90 mL/min signify                          possible Chronic Kidney Disease.  [name] Prothrombin Time 05/08/2012 13.8  11.6 - 15.2 seconds Final  . INR 05/08/2012 1.07  0.00 - 1.49 Final  . ABO/RH(D) 05/08/2012 O NEG   Final  . Antibody Screen 05/08/2012 NEG   Final  . Sample Expiration 05/08/2012 05/18/2012   Final  .  Color, Urine 05/08/2012 YELLOW  YELLOW Final  . APPearance 05/08/2012 CLEAR  CLEAR Final  . Specific Gravity, Urine 05/08/2012 1.020  1.005 - 1.030 Final  . pH 05/08/2012 7.0  5.0 - 8.0 Final  . Glucose, UA 05/08/2012 NEGATIVE  NEGATIVE mg/dL Final  . Hgb urine dipstick 05/08/2012 NEGATIVE  NEGATIVE Final  . Bilirubin Urine 05/08/2012 NEGATIVE  NEGATIVE Final  . Ketones, ur 05/08/2012 NEGATIVE  NEGATIVE mg/dL Final  . Protein, ur 19/14/7829 NEGATIVE  NEGATIVE mg/dL Final  . Urobilinogen, UA 05/08/2012 1.0  0.0 - 1.0 mg/dL Final  . Nitrite 56/21/3086 NEGATIVE  NEGATIVE Final  . Leukocytes, UA 05/08/2012 NEGATIVE  NEGATIVE Final   MICROSCOPIC NOT DONE ON URINES WITH NEGATIVE PROTEIN, BLOOD, LEUKOCYTES, NITRITE, OR GLUCOSE <1000 mg/dL.  [name] MRSA, PCR 05/08/2012 NEGATIVE  NEGATIVE Final  . Staphylococcus aureus 05/08/2012 NEGATIVE  NEGATIVE Final   Comment:                                   The Xpert SA Assay (FDA                          approved for NASAL specimens                          in patients over 37 years of age),                          is one component of                          a comprehensive surveillance                          program.  Test performance has                          been validated by [employer] for patients greater                          than or equal to 28 year old.                          It is not intended                          to diagnose infection nor to                          guide or monitor treatment.     X-Rays:Dg Chest 2 View  05/08/2012  *RADIOLOGY REPORT*  Clinical Data: Preop radiograph.  CHEST - 2 VIEW  Comparison: 07/19/2011  Findings: Heart size is normal.  There is no pleural effusion or edema.  No airspace consolidation identified.  Review of the visualized osseous structures is remarkable for a benign appearing sclerotic lesion within the left humeral head.  IMPRESSION:  1.  No acute cardiopulmonary abnormalities.   Original Report Authenticated By: Signa Kell,  M.D.    Dg Hip Complete Right  05/08/2012  *RADIOLOGY REPORT*  Clinical Data: Preop right total hip replacement  RIGHT HIP - COMPLETE 2+ VIEW  Comparison: 01/19/2012  Findings: Superior migration of the right total hip arthroplasty, unchanged.  Left total hip arthroplasty in satisfactory position.  No fracture or dislocation is seen.  Postsurgical changes in the left pelvis and right lower abdomen.  IMPRESSION: Superior migration of the right total hip arthroplasty, unchanged.  Left total hip arthroplasty in satisfactory position.   Original Report Authenticated By: Charline Bills, M.D.    Dg Pelvis Portable  05/15/2012  *RADIOLOGY REPORT*  Clinical Data: Post right acetabular repair  PORTABLE PELVIS  Comparison: Portable exam 1815 hours compared to 01/19/2012  Findings: Interval revision of the acetabular  component of a right total hip arthroplasty. No fracture or dislocation. Tip of femoral components not visualized on this exam. Stable appearance of left hip prosthesis. Diffuse osseous demineralization. Screw fragments noted at right supra acetabular region. Small amounts of heterotopic bone are seen in the region of the right hip. Surgical drain noted.  IMPRESSION: Post revision of the acetabular component of a right hip prosthesis. No acute abnormalities.   Original Report Authenticated By: Ulyses Southward, M.D.     EKG: Orders placed during the hospital encounter of 05/15/12  . EKG 12-LEAD  . EKG 12-LEAD  . EKG 12-LEAD  . EKG 12-LEAD  . EKG  . EKG  . EKG  . EKG  . EKG     Hospital Course: Patient was admitted to [facility] and taken to the OR and underwent the above state procedure without complications.  Patient tolerated the procedure well and was later transferred to the recovery room and then to the orthopaedic floor for postoperative care.  They were given PO and IV analgesics for pain control following their surgery.  They were given 24 hours of postoperative antibiotics of  Anti-infectives   Start     Dose/Rate Route Frequency Ordered Stop   05/16/12 0600  ceFAZolin (ANCEF) IVPB 2 g/50 mL premix     2 g 100 mL/hr over 30 Minutes Intravenous On call to O.R. 05/15/12 1208 05/15/12 1624   05/15/12 2200  ceFAZolin (ANCEF) IVPB 1 g/50 mL premix     1 g 100 mL/hr over 30 Minutes Intravenous Every 6 hours 05/15/12 1902 05/16/12 0452     and started on DVT prophylaxis in the form of Xarelto.   PT and OT were ordered for total hip protocol.  The patient was allowed to be WBAT with therapy. Discharge planning was consulted to help with postop disposition and equipment needs.  Patient had a decent night on the evening of surgery.  He had an EKG that was reported to be AFib with RVR and a pulse rate of 148 by EKG strip on the evening of surgery but was stable post op day one. They  started to get up OOB with therapy on day one.  Hemovac drain was pulled without difficulty.  The knee immobilizer was removed and discontinued.  Continued to work with therapy into day two.  Dressing was changed on day two and the incision was clean and dry.  By day two, the patient had progressed with therapy and meeting their goals.  Incision was healing well.  Patient was seen in rounds and was ready to go home.   Discharge Medications: Prior to Admission medications   Medication Sig Start Date End Date Taking? Authorizing Provider  furosemide (LASIX) 40 MG tablet Take 40 mg by mouth every morning.   Yes Historical Provider, MD  carvedilol (COREG) 6.25 MG tablet Take 6.25 mg by mouth 2 (two) times daily.    Historical Provider, MD  fluticasone (FLONASE) 50 MCG/ACT nasal spray Place 2 sprays into the nose daily as needed for allergies.    Historical Provider, MD  lisinopril (PRINIVIL,ZESTRIL) 2.5 MG tablet Take 2.5 mg by mouth every morning.    Historical Provider, MD  methocarbamol (ROBAXIN) 500 MG tablet Take 1 tablet (500 mg total) by mouth every 6 (six) hours as needed. 05/17/12   Alexzandrew Perkins, PA-C  oxyCODONE (OXY IR/ROXICODONE) 5 MG immediate release tablet Take 1-2 tablets (5-10 mg total) by mouth every 3 (three) hours as needed. 05/17/12   Alexzandrew Julien Girt, PA-C  Rivaroxaban (XARELTO) 20 MG TABS Take 20 mg by mouth every morning.    Historical Provider, MD  spironolactone (ALDACTONE) 25 MG tablet Take 25 mg by mouth every morning.    Historical Provider, MD  traMADol (ULTRAM) 50 MG tablet Take 1-2 tablets (50-100 mg total) by mouth every 6 (six) hours as needed (mild pain). 05/17/12   Alexzandrew Julien Girt, PA-C    Diet: Cardiac diet Activity:WBAT No bending hip over 90 degrees- A "L" Angle Do not cross legs Do not let foot roll inward When turning these patients a pillow should be placed between the patient's legs to prevent crossing. Patients should have the affected knee fully  extended when trying to sit or stand from all surfaces to prevent excessive hip flexion. When ambulating and turning toward the affected side the affected leg should have the toes turned out prior to moving the walker and the rest of patient's body as to prevent internal rotation/ turning in of the leg. Abduction pillows are the most effective way to prevent a patient from not crossing legs or turning toes in at rest. If an abduction pillow is not ordered placing a regular pillow length wise between the patient's legs is also an effective reminder. It is imperative that these precautions be maintained so that the surgical hip does not dislocate. Follow-up:in 2 weeks Disposition - Home Discharged Condition: good   Discharge Orders   Future Orders Complete By Expires     Call MD / Call 911  As directed     Comments:      If you experience chest pain or shortness of breath, CALL 911 and be transported to the hospital emergency room.  If you develope a fever above 101 F, pus (white drainage) or increased drainage or redness at the wound, or calf pain, call your surgeon's office.    Change dressing  As directed     Comments:      You may change your dressing dressing daily with sterile 4 x 4 inch gauze dressing and paper tape.  Do not submerge the incision under water.    Constipation Prevention  As directed     Comments:      Drink plenty of fluids.  Prune juice may be helpful.  You may use a stool softener, such as Colace (over the counter) 100 mg twice a day.  Use MiraLax (over the counter) for constipation as needed.    Diet - low sodium heart healthy  As directed     Discharge instructions  As directed     Comments:      Pick up stool softner and laxative for home. Do not submerge incision under water. May shower.  Continue to use ice for pain and swelling from surgery.  Resume his normal Xarelto medication at home.    Do not sit on low chairs, stoools or toilet seats, as it may be  difficult to get up from low surfaces  As directed     Driving restrictions  As directed     Comments:      No driving until released by the physician.    Follow the hip precautions as taught in Physical Therapy  As directed     Increase activity slowly as tolerated  As directed     Lifting restrictions  As directed     Comments:      No lifting until released by the physician.    Patient may shower  As directed     Comments:      You may shower without a dressing once there is no drainage.  Do not wash over the wound.  If drainage remains, do not shower until drainage stops.    TED hose  As directed     Comments:      Use stockings (TED hose) for 3 weeks on both leg(s).  You may remove them at night for sleeping.    Weight bearing as tolerated  As directed         Medication List    STOP taking these medications       HYDROcodone-acetaminophen 5-325 MG per tablet  Commonly known as:  NORCO/VICODIN     oxycodone-acetaminophen 5-300 MG per tablet  Commonly known as:  LYNOX      TAKE these medications       carvedilol 6.25 MG tablet  Commonly known as:  COREG  Take 6.25 mg by mouth 2 (two) times daily.     fluticasone 50 MCG/ACT nasal spray  Commonly known as:  FLONASE  Place 2 sprays into the nose daily as needed for allergies.     furosemide 40 MG tablet  Commonly known as:  LASIX  Take 40 mg by mouth every morning.     lisinopril 2.5 MG tablet  Commonly known as:  PRINIVIL,ZESTRIL  Take 2.5 mg by mouth every morning.     methocarbamol 500 MG tablet  Commonly known as:  ROBAXIN  Take 1 tablet (500 mg total) by mouth every 6 (six) hours as needed.     oxyCODONE 5 MG immediate release tablet  Commonly known as:  Oxy IR/ROXICODONE  Take 1-2 tablets (5-10 mg total) by mouth every 3 (three) hours as needed.     spironolactone 25 MG tablet  Commonly known as:  ALDACTONE  Take 25 mg by mouth every morning.     traMADol 50 MG tablet  Commonly known as:  ULTRAM    Take 1-2 tablets (50-100 mg total) by mouth every 6 (six) hours as needed (mild pain).     XARELTO 20 MG Tabs  Generic drug:  Rivaroxaban  Take 20 mg by mouth every morning.           Follow-up Information   Follow up with Loanne Drilling, MD. Schedule an appointment as soon as possible for a visit in 2 weeks.   Contact information:   [address], [address], [place] [address] [place] [address] [phone]       Signed: Celedonio Savage Tyrez Berrios LAUREN 06/06/2012, 11:35 AM

## 2014-07-16 IMAGING — CR DG HIP COMPLETE 2+V*R*
3 series · 3 of 3 positions shown · non-contrast
Comparison: 01/19/2012

CLINICAL DATA: Preop right total hip replacement

RIGHT HIP - COMPLETE 2+ VIEW

[t pelvis a.p.]
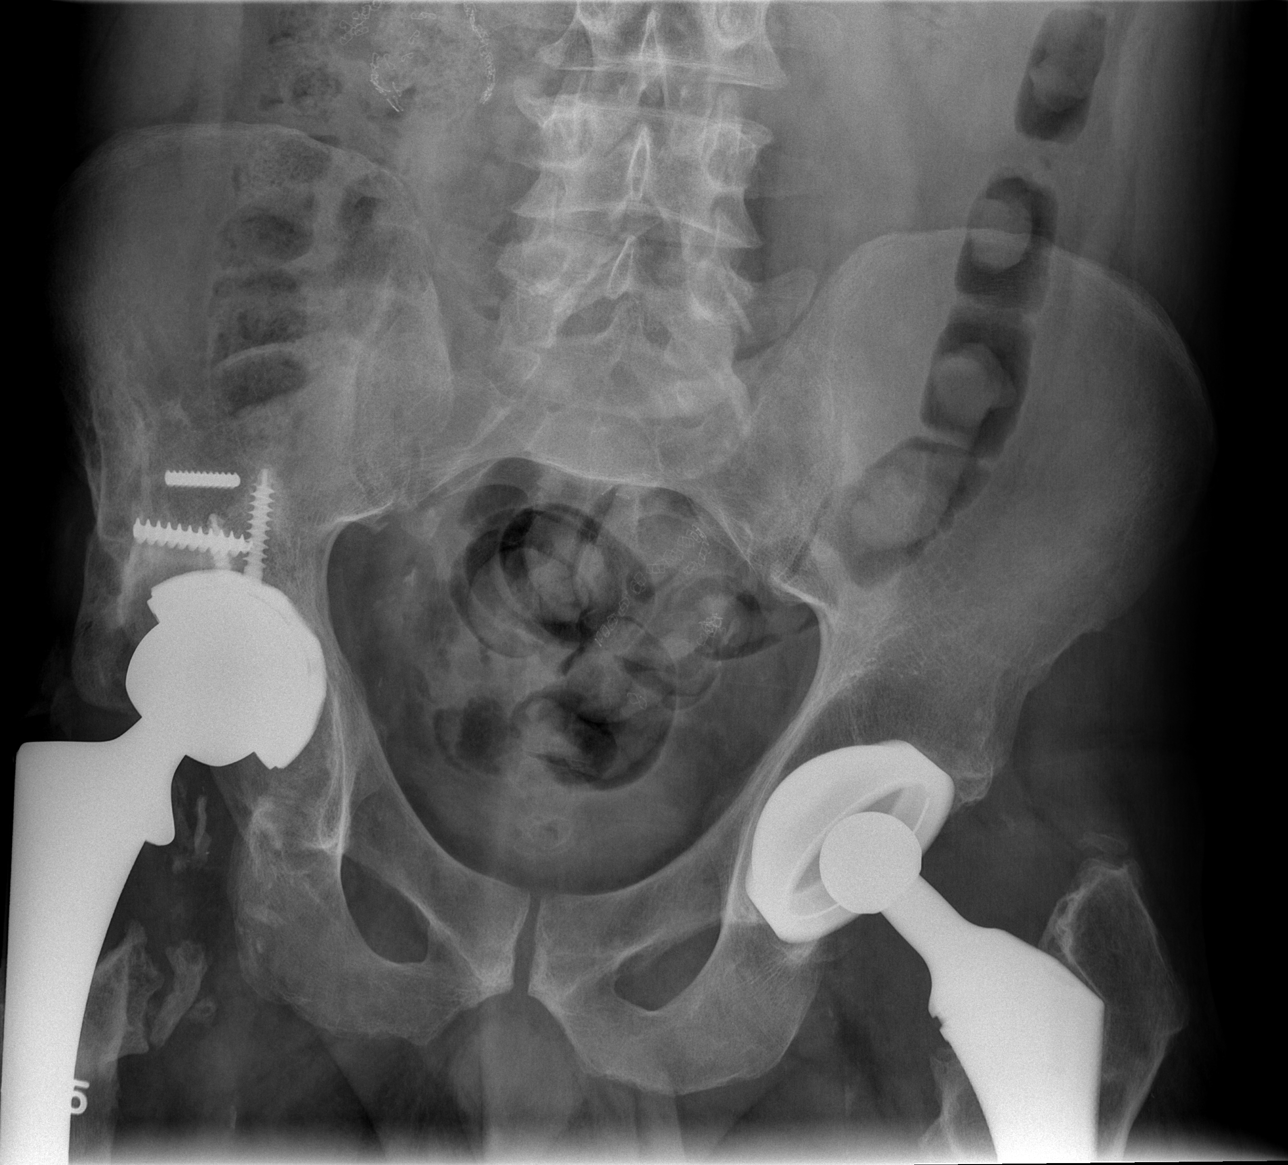

[t hip ap right *]
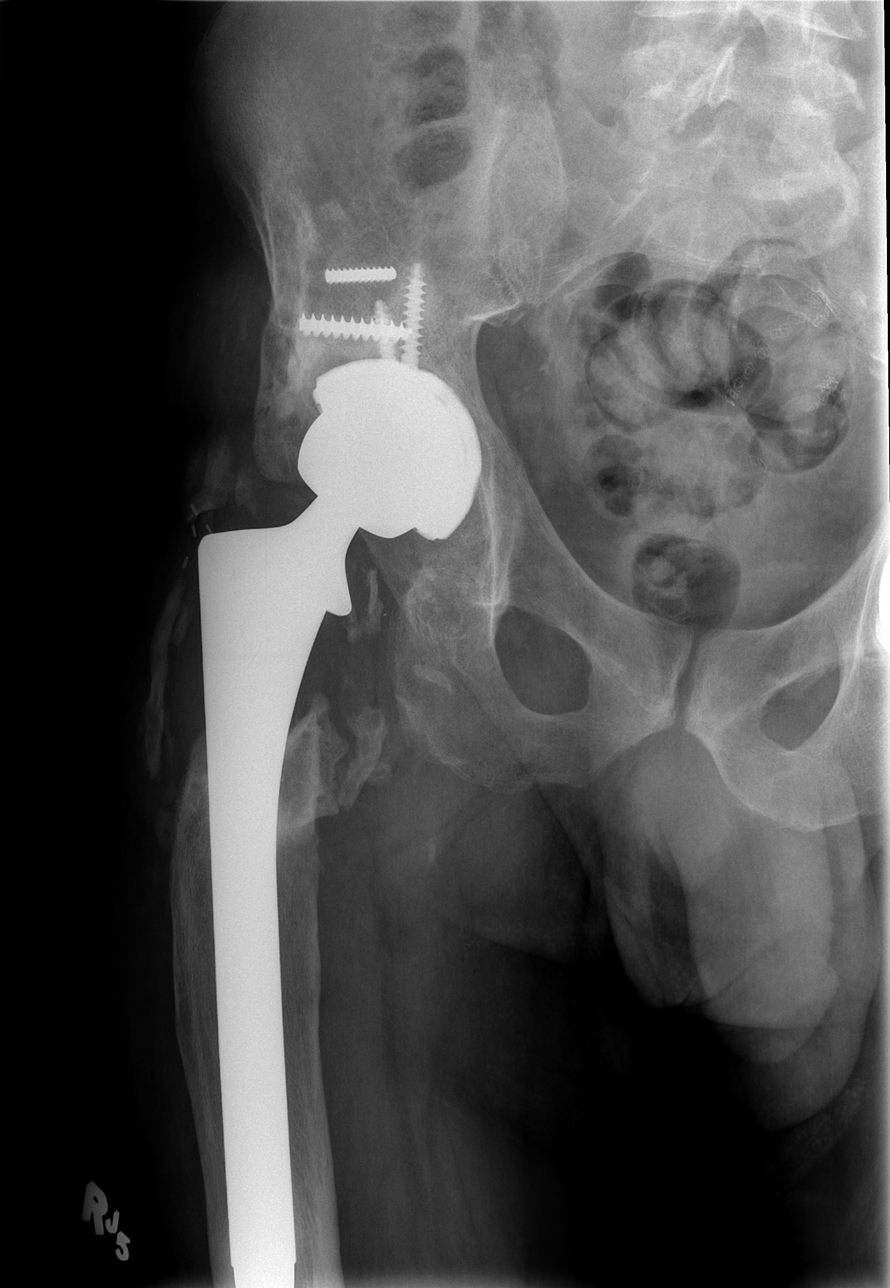

[t hip frog leg right]
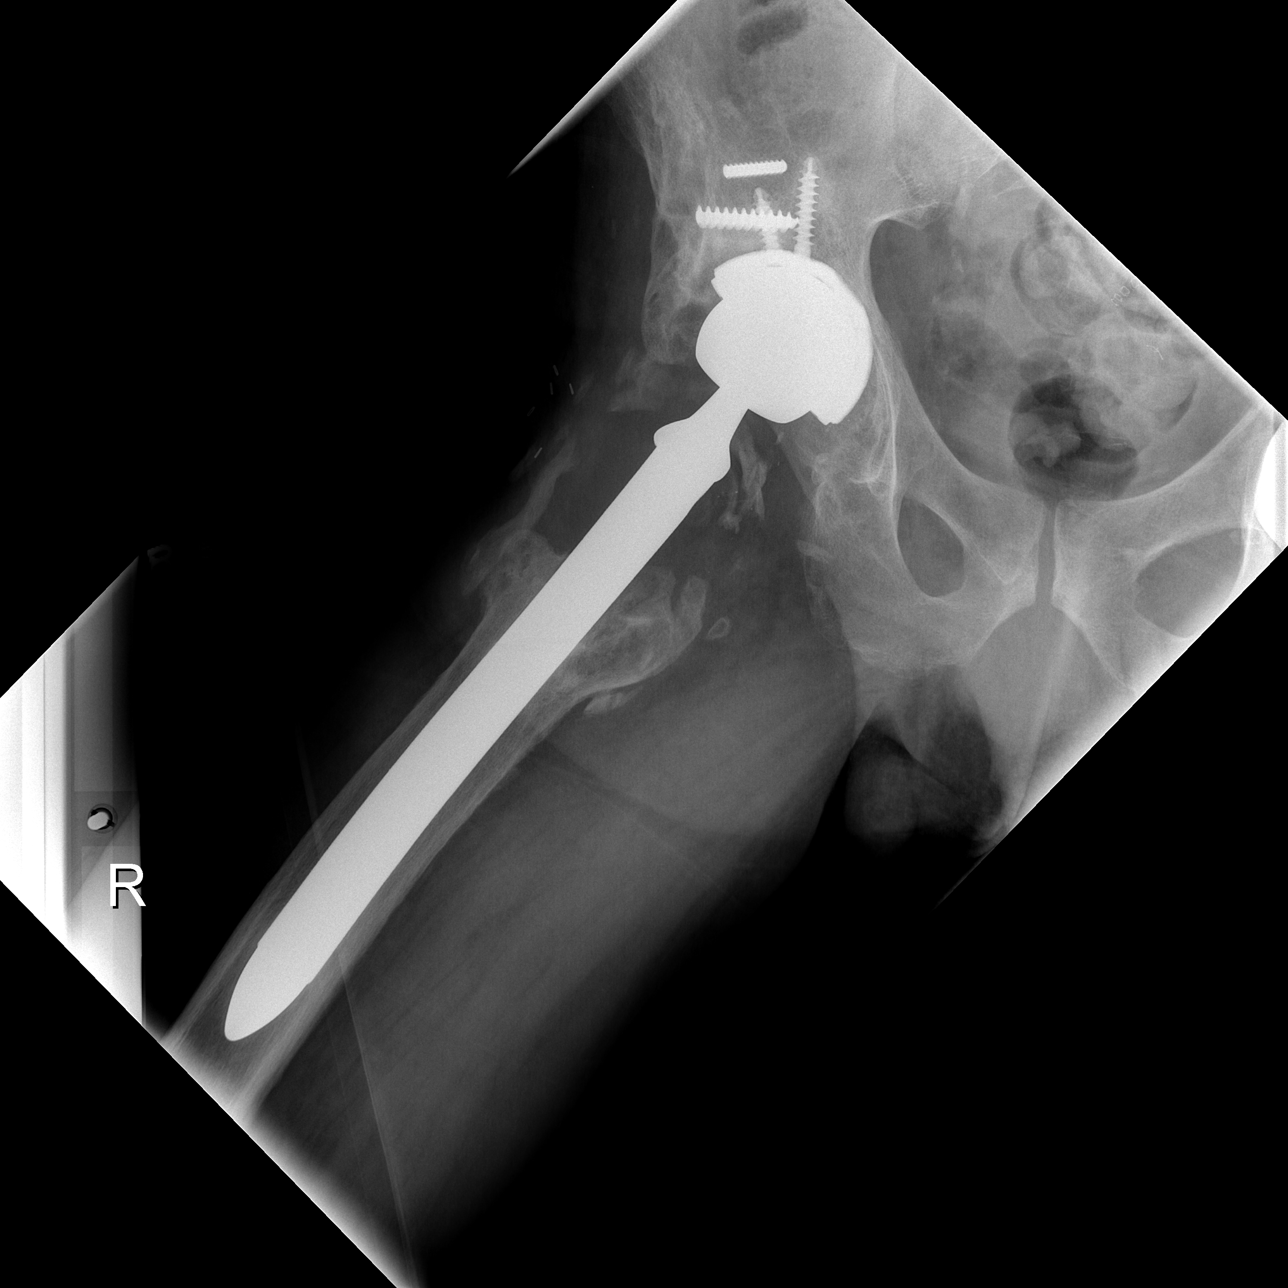

[3 of 3 positions shown; findings below may reference images not displayed]

FINDINGS: Superior migration of the right total hip arthroplasty,
unchanged.

Left total hip arthroplasty in satisfactory position.

No fracture or dislocation is seen.

Postsurgical changes in the left pelvis and right lower abdomen.
IMPRESSION: Superior migration of the right total hip arthroplasty, unchanged.

Left total hip arthroplasty in satisfactory position.

## 2014-07-23 IMAGING — CR DG PORTABLE PELVIS
1 series · 1 of 1 positions shown · non-contrast
Comparison: Portable exam 4447 hours compared to 01/19/2012

CLINICAL DATA: Post right acetabular repair

PORTABLE PELVIS

[AP]
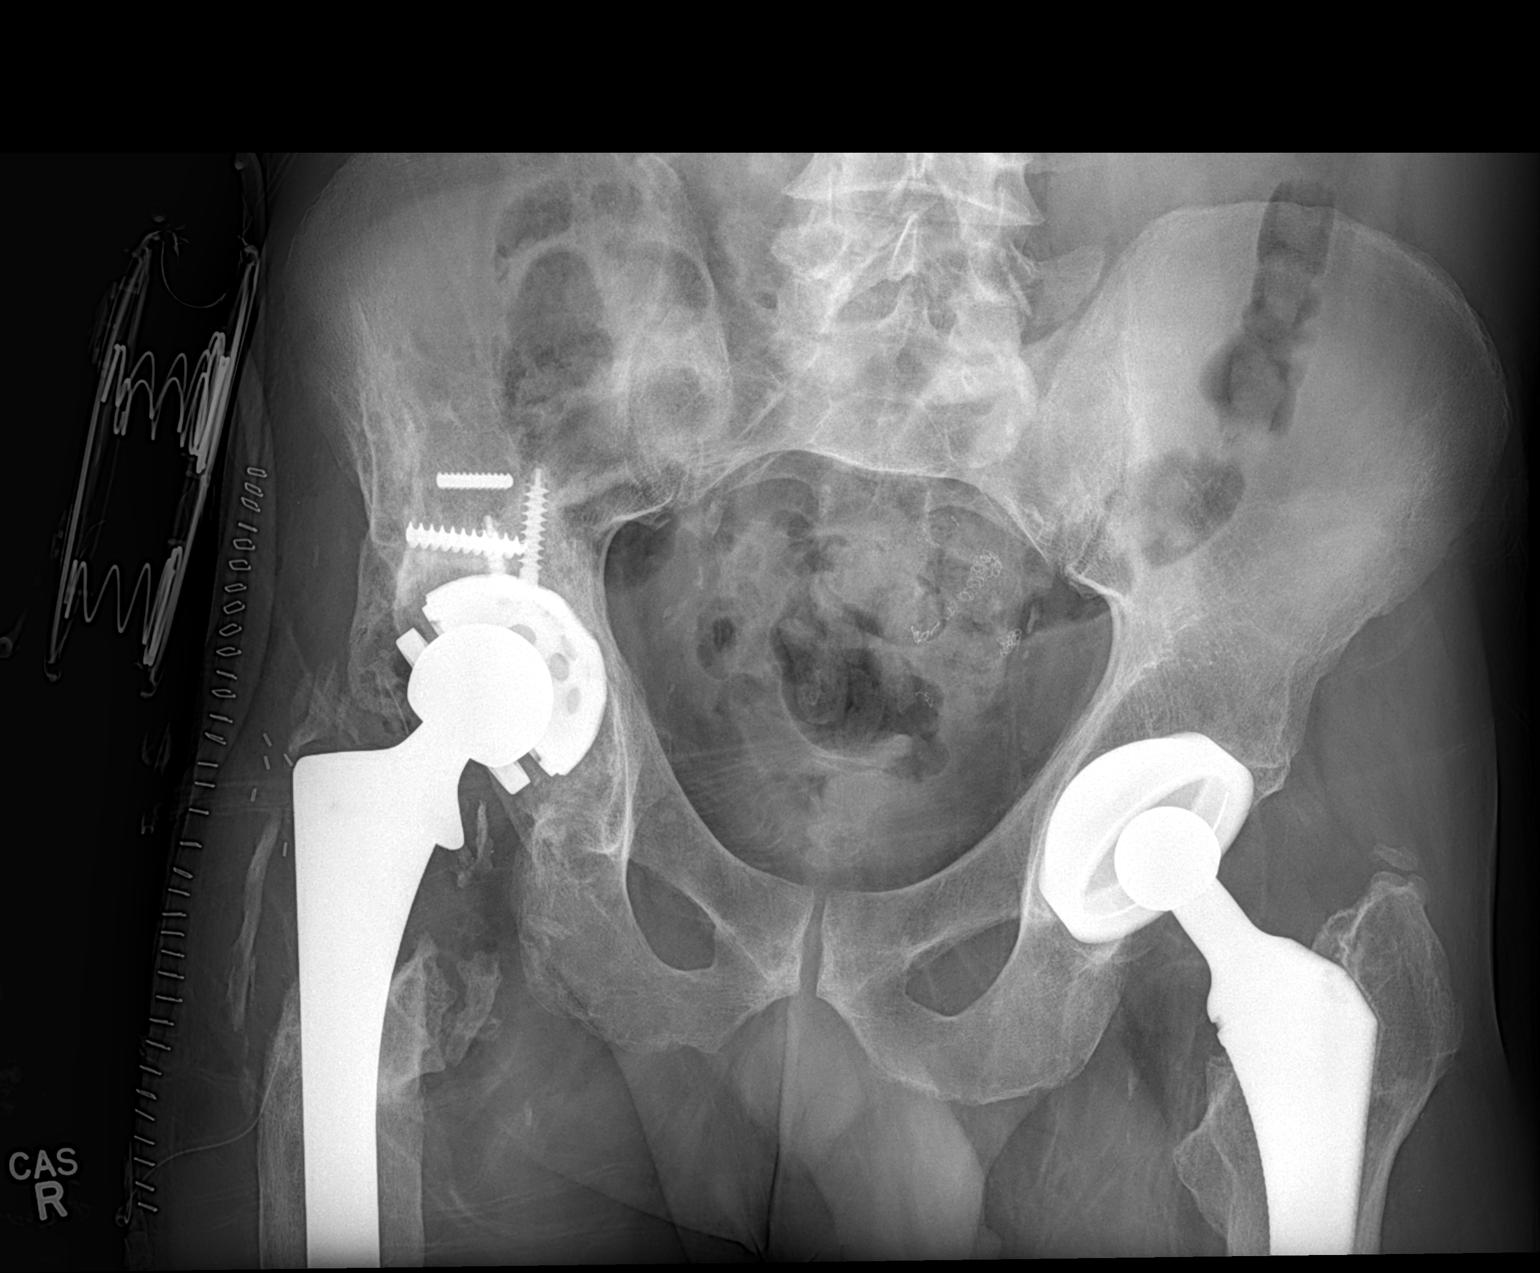

[1 of 1 positions shown; findings below may reference images not displayed]

FINDINGS: Interval revision of the acetabular component of a right total hip
arthroplasty.
No fracture or dislocation.
Tip of femoral components not visualized on this exam.
Stable appearance of left hip prosthesis.
Diffuse osseous demineralization.
Screw fragments noted at right supra acetabular region.
Small amounts of heterotopic bone are seen in the region of the
right hip.
Surgical drain noted.
IMPRESSION: Post revision of the acetabular component of a right hip
prosthesis.
No acute abnormalities.

## 2014-08-31 DIAGNOSIS — I5023 Acute on chronic systolic (congestive) heart failure: Secondary | ICD-10-CM | POA: Insufficient documentation

## 2014-08-31 DIAGNOSIS — I480 Paroxysmal atrial fibrillation: Secondary | ICD-10-CM | POA: Insufficient documentation

## 2014-08-31 DIAGNOSIS — F17200 Nicotine dependence, unspecified, uncomplicated: Secondary | ICD-10-CM | POA: Insufficient documentation

## 2015-10-22 DIAGNOSIS — K219 Gastro-esophageal reflux disease without esophagitis: Secondary | ICD-10-CM | POA: Insufficient documentation

## 2015-10-22 DIAGNOSIS — N179 Acute kidney failure, unspecified: Secondary | ICD-10-CM | POA: Insufficient documentation

## 2015-11-11 DIAGNOSIS — Z9889 Other specified postprocedural states: Secondary | ICD-10-CM | POA: Insufficient documentation

## 2015-11-11 DIAGNOSIS — I4892 Unspecified atrial flutter: Secondary | ICD-10-CM | POA: Insufficient documentation

## 2016-04-19 ENCOUNTER — Other Ambulatory Visit: Payer: Self-pay | Admitting: Ophthalmology

## 2016-04-19 DIAGNOSIS — H4710 Unspecified papilledema: Secondary | ICD-10-CM

## 2016-08-02 DIAGNOSIS — I2510 Atherosclerotic heart disease of native coronary artery without angina pectoris: Secondary | ICD-10-CM | POA: Insufficient documentation

## 2016-08-02 DIAGNOSIS — R0609 Other forms of dyspnea: Secondary | ICD-10-CM | POA: Insufficient documentation

## 2016-11-06 DIAGNOSIS — Z79899 Other long term (current) drug therapy: Secondary | ICD-10-CM | POA: Insufficient documentation

## 2016-11-06 DIAGNOSIS — I420 Dilated cardiomyopathy: Secondary | ICD-10-CM | POA: Insufficient documentation

## 2016-11-06 DIAGNOSIS — E785 Hyperlipidemia, unspecified: Secondary | ICD-10-CM | POA: Insufficient documentation

## 2016-11-06 DIAGNOSIS — Z7901 Long term (current) use of anticoagulants: Secondary | ICD-10-CM | POA: Insufficient documentation

## 2017-11-13 DIAGNOSIS — F17210 Nicotine dependence, cigarettes, uncomplicated: Secondary | ICD-10-CM | POA: Insufficient documentation

## 2017-11-13 DIAGNOSIS — J449 Chronic obstructive pulmonary disease, unspecified: Secondary | ICD-10-CM | POA: Insufficient documentation

## 2018-02-28 DIAGNOSIS — L600 Ingrowing nail: Secondary | ICD-10-CM | POA: Insufficient documentation

## 2018-02-28 DIAGNOSIS — M79675 Pain in left toe(s): Secondary | ICD-10-CM | POA: Insufficient documentation

## 2018-04-04 ENCOUNTER — Encounter: Payer: Self-pay | Admitting: Internal Medicine

## 2018-04-04 DIAGNOSIS — I509 Heart failure, unspecified: Secondary | ICD-10-CM | POA: Diagnosis not present

## 2018-04-04 DIAGNOSIS — Z720 Tobacco use: Secondary | ICD-10-CM

## 2018-04-04 DIAGNOSIS — I4891 Unspecified atrial fibrillation: Secondary | ICD-10-CM

## 2018-04-04 DIAGNOSIS — E785 Hyperlipidemia, unspecified: Secondary | ICD-10-CM

## 2018-04-04 DIAGNOSIS — I5020 Unspecified systolic (congestive) heart failure: Secondary | ICD-10-CM

## 2018-04-04 DIAGNOSIS — I10 Essential (primary) hypertension: Secondary | ICD-10-CM

## 2018-04-04 DIAGNOSIS — F1990 Other psychoactive substance use, unspecified, uncomplicated: Secondary | ICD-10-CM

## 2018-04-04 DIAGNOSIS — F1011 Alcohol abuse, in remission: Secondary | ICD-10-CM

## 2018-04-09 ENCOUNTER — Encounter: Payer: Self-pay | Admitting: Neurology

## 2018-04-10 DIAGNOSIS — R1310 Dysphagia, unspecified: Secondary | ICD-10-CM | POA: Insufficient documentation

## 2018-04-10 DIAGNOSIS — R1319 Other dysphagia: Secondary | ICD-10-CM | POA: Insufficient documentation

## 2018-04-10 DIAGNOSIS — Z8719 Personal history of other diseases of the digestive system: Secondary | ICD-10-CM | POA: Insufficient documentation

## 2018-06-10 ENCOUNTER — Encounter: Payer: Self-pay | Admitting: Neurology

## 2018-06-21 ENCOUNTER — Encounter

## 2018-06-21 ENCOUNTER — Ambulatory Visit: Payer: Self-pay | Admitting: Neurology

## 2018-06-24 NOTE — Progress Notes (Signed)
New Patient Virtual Visit via Video Note The purpose of this virtual visit is to provide medical care while limiting exposure to the novel coronavirus.    Consent was obtained for video visit:  Yes.   Answered questions that patient had about telehealth interaction:  Yes.   I discussed the limitations, risks, security and privacy concerns of performing an evaluation and management service by telemedicine. I also discussed with the patient that there may be a patient responsible charge related to this service. The patient expressed understanding and agreed to proceed.  Pt location: Home Physician Location: office Name of referring provider:  Jearld LeschWilliams, Dwight M, MD I connected with Paul Kindredavid Volcy at patients initiation/request on 06/25/2018 at  2:00 PM EDT by video enabled telemedicine application and verified that I am speaking with the correct person using two identifiers. Pt MRN:  [id number] Pt DOB:  07/15/1960 Video Participants:  Paul Kindredavid Schlie    History of Present Illness: Paul KindredDavid David is a 58 y.o. right-handed Caucasian male with hypertension, hyperlipidemia, atrial fibrillation, heart failure, and chronic pain presenting for evaluation of numbness and tingling of the hands.   Starting around late 2019, he began having tingling of the left hand, most noticeable over the fourth and fifth fingers.  He does have mild radiation of numbness into the forearm up to the elbow.  Symptoms are constant, without any alleviating or exacerbating factors.  He has noticed reduced grip strength and difficulty with dexterity in the left hand.  He does admit to resting the elbows on his dining table regularly.  He does not have any neck pain, muscle cramps, or similar symptoms on the right hand.  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  Past Medical History:  Diagnosis Date  . Anxiety   . Arthritis   . Cardiomyopathy (HCC)   . CHF (congestive  heart failure) (HCC)   . COPD (chronic obstructive pulmonary disease) (HCC)   . Dysphagia    with esophageal stricture  . Dysrhythmia    atrial fib  . GERD (gastroesophageal reflux disease)   . History of blood transfusion   . Hypertension     Past Surgical History:  Procedure Laterality Date  . ACETABULAR REVISION Right 05/15/2012   Procedure: ACETABULAR REVISION;  Surgeon: Loanne DrillingFrank V Aluisio, MD;  Location: [facility];  Service: Orthopedics;  Laterality: Right;  RIGHT HIP BEARING SURFACE VS ACETABULAR REVISION   . ESOPHAGOGASTRODUODENOSCOPY     with dilitation  . EYE SURGERY Right    cataract extraction with IOL  . JOINT REPLACEMENT Bilateral    hips/revision right x 4     Medications:  Outpatient Encounter Medications as of 06/25/2018  Medication Sig  . albuterol (PROVENTIL) (2.5 MG/3ML) 0.083% nebulizer solution TAKE 1 INHALATION MILLILITER 4 TIMES A DAY  . amiodarone (PACERONE) 200 MG tablet Take by mouth.  Marland Kitchen. apixaban (ELIQUIS) 5 MG TABS tablet Take by mouth.  Marland Kitchen. aspirin 81 MG chewable tablet Chew by mouth.  Marland Kitchen. atorvastatin (LIPITOR) 80 MG tablet Take by mouth.  . carvedilol (COREG) 6.25 MG tablet Take 6.25 mg by mouth 2 (two) times daily.  . fluticasone (FLONASE) 50 MCG/ACT nasal spray Place 2 sprays into the nose daily as needed for allergies.  . furosemide (LASIX) 20 MG tablet Take 20 mg by mouth daily.  Marland Kitchen. lisinopril (PRINIVIL,ZESTRIL) 2.5 MG tablet Take 2.5 mg by mouth every morning.  . magnesium oxide (MAG-OX) 400 MG tablet Take by mouth.  .Marland Kitchen  oseltamivir (TAMIFLU) 75 MG capsule TAKE 1 CAPSULE BY MOUTH TWICE A DAY FOR 5 DAYS  . oxyCODONE (OXY IR/ROXICODONE) 5 MG immediate release tablet Take 1-2 tablets (5-10 mg total) by mouth every 3 (three) hours as needed.  Marland Kitchen oxyCODONE-acetaminophen (PERCOCET) 10-325 MG tablet as needed.  . promethazine-dextromethorphan (PROMETHAZINE-DM) 6.25-15 MG/5ML syrup TAKE 5 ML ORALLY EVERY 4 HOURS AS NEEDED  . spironolactone (ALDACTONE) 25 MG tablet  Take 25 mg by mouth every morning.  . traMADol (ULTRAM) 50 MG tablet Take 1-2 tablets (50-100 mg total) by mouth every 6 (six) hours as needed (mild pain).  . [DISCONTINUED] furosemide (LASIX) 40 MG tablet Take 40 mg by mouth every morning.  . [DISCONTINUED] Rivaroxaban (XARELTO) 20 MG TABS Take 20 mg by mouth every morning.  . [DISCONTINUED] methocarbamol (ROBAXIN) 500 MG tablet Take 1 tablet (500 mg total) by mouth every 6 (six) hours as needed.   No facility-administered encounter medications on file as of 06/25/2018.     Allergies:  Allergies  Allergen Reactions  . Codeine Nausea And Vomiting    Family History: Family History  Problem Relation Age of Onset  . Diabetes Mother   . Stroke Father     Social History: Social History   Tobacco Use  . Smoking status: Current Every Day Smoker    Packs/day: 1.00    Years: 32.00    Pack years: 32.00    Types: Cigarettes  . Smokeless tobacco: Never Used  Substance Use Topics  . Alcohol use: No  . Drug use: No   Social History   Social History Narrative   On disability. He lives alone.   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    Review of Systems:  CONSTITUTIONAL: No fevers, chills, night sweats, or weight loss.   EYES: No visual changes or eye pain ENT: No hearing changes.  No history of nose bleeds.   RESPIRATORY: No cough, wheezing and shortness of breath.   CARDIOVASCULAR: Negative for chest pain, and palpitations.   GI: Negative for abdominal discomfort, blood in stools or black stools.  No recent change in bowel habits.   GU:  No history of incontinence.   MUSCLOSKELETAL: +history of joint pain or swelling.  No myalgias.   SKIN: Negative for lesions, rash, and itching.   HEMATOLOGY/ONCOLOGY: Negative for prolonged bleeding, bruising easily, and swollen nodes.  No history of cancer.   ENDOCRINE: Negative for cold or heat intolerance, polydipsia or goiter.   PSYCH:  No depression or anxiety symptoms.   NEURO:  As Above.   Vital Signs:  Ht  (1.753 m)   Wt 190 lb (86.2 kg)   BMI 28.06 kg/m    General Medical Exam:  Well appearing, comfortable.  Nonlabored breathing.   Neurological Exam: MENTAL STATUS including orientation to time, place, person, recent and remote memory, attention span and concentration, language, and fund of knowledge is normal.  Speech is not dysarthric.  CRANIAL NERVES:  Normal conjugate, extra-ocular eye movements in all directions of gaze.  No ptosis.  Normal facial symmetry and movements.   MOTOR:  Antigravity in all extremities.  No abnormal movements.  No pronator drift.   COORDINATION/GAIT: Normal finger to nose bilaterally.  Intact rapid alternating movements bilaterally.  Gait narrow based and stable.  IMPRESSION: Left hand paresthesias, most suggestive of ulnar neuropathy at the elbow  - NCS/EMG of the left arm to characterize the nature of his symptoms  - Discussed strategies to minimize nerve  compression at the elbow, including wearing an elbow pad/brace and avoidance of hyperflexion  - He does not have painful paresthesias, and symptoms are predominantly numbness, and therefore no indication for medication  Follow Up Instructions:  I discussed the assessment and treatment plan with the patient. The patient was provided an opportunity to ask questions and all were answered. The patient agreed with the plan and demonstrated an understanding of the instructions.   The patient was advised to call back or seek an in-person evaluation if the symptoms worsen or if the condition fails to improve as anticipated.  Return to clinic after testing   Glendale Chard, DO

## 2018-06-25 ENCOUNTER — Other Ambulatory Visit: Payer: Self-pay

## 2018-06-25 ENCOUNTER — Telehealth (INDEPENDENT_AMBULATORY_CARE_PROVIDER_SITE_OTHER): Payer: Medicare Other | Admitting: Neurology

## 2018-06-25 ENCOUNTER — Encounter: Payer: Self-pay | Admitting: Neurology

## 2018-06-25 VITALS — Ht 69.0 in | Wt 190.0 lb

## 2018-06-25 DIAGNOSIS — R202 Paresthesia of skin: Secondary | ICD-10-CM | POA: Diagnosis not present

## 2018-07-18 ENCOUNTER — Encounter: Payer: Medicare Other | Admitting: Neurology

## 2018-07-18 ENCOUNTER — Ambulatory Visit: Payer: Medicare Other | Admitting: Neurology

## 2018-08-15 ENCOUNTER — Ambulatory Visit (INDEPENDENT_AMBULATORY_CARE_PROVIDER_SITE_OTHER): Payer: Medicare Other | Admitting: Neurology

## 2018-08-15 ENCOUNTER — Encounter: Payer: Self-pay | Admitting: Neurology

## 2018-08-15 ENCOUNTER — Other Ambulatory Visit: Payer: Self-pay

## 2018-08-15 VITALS — BP 161/94 | HR 71 | Ht 69.0 in | Wt 190.0 lb

## 2018-08-15 DIAGNOSIS — R292 Abnormal reflex: Secondary | ICD-10-CM | POA: Diagnosis not present

## 2018-08-15 DIAGNOSIS — R29898 Other symptoms and signs involving the musculoskeletal system: Secondary | ICD-10-CM

## 2018-08-15 DIAGNOSIS — G5622 Lesion of ulnar nerve, left upper limb: Secondary | ICD-10-CM | POA: Diagnosis not present

## 2018-08-15 DIAGNOSIS — M62542 Muscle wasting and atrophy, not elsewhere classified, left hand: Secondary | ICD-10-CM

## 2018-08-15 DIAGNOSIS — R202 Paresthesia of skin: Secondary | ICD-10-CM

## 2018-08-15 NOTE — Progress Notes (Signed)
Follow-up Visit   Date: 08/15/18   [name] MRN: [id number] DOB: 05-04-1960   Interim History: [name] is a 58 y.o. right-handed Caucasian male with hypertension, hyperlipidemia, atrial fibrillation, heart failure, and chronic pain returning to the clinic for follow-up of numbness/tingling of the left hand.  The patient was accompanied to the clinic by self.  History of present illness: Starting around late 2019, he began having tingling of the left hand, most noticeable over the fourth and fifth fingers.  He does have mild radiation of numbness into the forearm up to the elbow.  Symptoms are constant, without any alleviating or exacerbating factors.  He has noticed reduced grip strength and difficulty with dexterity in the left hand.  He does admit to resting the elbows on his dining table regularly.  He does not have any neck pain, muscle cramps, or similar symptoms on the right hand.  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  UPDATE 08/15/2018:  He is here for follow-up visit.  He continues to complain of numbness over the left fourth and fifth fingers and on the lateral side of the hand.  He has noticed progressive weakness, difficulty with holding objects, and muscle wasting in the hand.  He denies any radicular neck pain.  No similar symptoms in the right hand.  He does endorse resting his left elbow on his dining table regularly.  Medications:  Current Outpatient Medications on File Prior to Visit  Medication Sig Dispense Refill  . albuterol (PROVENTIL) (2.5 MG/3ML) 0.083% nebulizer solution TAKE 1 INHALATION MILLILITER 4 TIMES A DAY    . amiodarone (PACERONE) 200 MG tablet Take by mouth.    Marland Kitchen apixaban (ELIQUIS) 5 MG TABS tablet Take by mouth.    Marland Kitchen aspirin 81 MG chewable tablet Chew by mouth.    Marland Kitchen atorvastatin (LIPITOR) 80 MG tablet Take by mouth.    . carvedilol (COREG) 6.25 MG tablet Take 6.25 mg by mouth 2 (two)  times daily.    . fluticasone (FLONASE) 50 MCG/ACT nasal spray Place 2 sprays into the nose daily as needed for allergies.    . furosemide (LASIX) 20 MG tablet Take 20 mg by mouth daily.    Marland Kitchen lisinopril (PRINIVIL,ZESTRIL) 2.5 MG tablet Take 2.5 mg by mouth every morning.    . magnesium oxide (MAG-OX) 400 MG tablet Take by mouth.    . oxyCODONE-acetaminophen (PERCOCET) 10-325 MG tablet as needed.    Marland Kitchen spironolactone (ALDACTONE) 25 MG tablet Take 25 mg by mouth every morning.    . traMADol (ULTRAM) 50 MG tablet Take 1-2 tablets (50-100 mg total) by mouth every 6 (six) hours as needed (mild pain). 60 tablet 0   No current facility-administered medications on file prior to visit.     Allergies:  Allergies  Allergen Reactions  . Codeine Nausea And Vomiting    Review of Systems:  CONSTITUTIONAL: No fevers, chills, night sweats, or weight loss.  EYES: No visual changes or eye pain ENT: No hearing changes.  No history of nose bleeds.   RESPIRATORY: No cough, wheezing and shortness of breath.   CARDIOVASCULAR: Negative for chest pain, and palpitations.   GI: Negative for abdominal discomfort, blood in stools or black stools.  No recent change in bowel habits.   GU:  No history of incontinence.   MUSCLOSKELETAL: No history of joint pain or swelling.  No myalgias.   SKIN: Negative for lesions, rash, and itching.  ENDOCRINE: Negative for cold or heat intolerance, polydipsia or goiter.   PSYCH:  No  depression or anxiety symptoms.   NEURO: As Above.   Vital Signs:  There were no vitals taken for this visit.   General Medical Exam:   General:  Well appearing, comfortable  Eyes/ENT: see cranial nerve examination.   Neck:  No carotid bruits. Respiratory:  Clear to auscultation, good air entry bilaterally.   Cardiac:  Regular rate and rhythm, no murmur.   Ext:  No edema   Neurological Exam: MENTAL STATUS including orientation to time, place, person, recent and remote memory, attention  span and concentration, language, and fund of knowledge is normal.  Speech is not dysarthric.  CRANIAL NERVES:  No visual field defects.  Pupils equal round and reactive to light.  Normal conjugate, extra-ocular eye movements in all directions of gaze.  No ptosis.  Face is symmetric.  MOTOR: Severe left hand atrophy involving the FDI, ADM, and intrinsic hand muscles.  No fasciculations or abnormal movements.  No pronator drift.   Upper Extremity:  Right  Left  Deltoid  5/5   5/5   Biceps  5/5   5/5   Triceps  5/5   5/5   Infraspinatus 5/5  5/5  Medial pectoralis 5/5  5/5  Wrist extensors  5/5   5/5   Wrist flexors  5/5   5/5   Finger extensors  5/5   5/5   Finger flexors  5/5   4/5   Dorsal interossei  5/5   4/5   Abductor pollicis  5/5   5/5   Tone (Ashworth scale)  0  0   Lower Extremity:  Right  Left  Hip flexors  5/5   5/5   Hip extensors  5/5   5/5   Adductor 5/5  5/5  Abductor 5/5  5/5  Knee flexors  5/5   5/5   Knee extensors  5/5   5/5   Dorsiflexors  5/5   5/5   Plantarflexors  5/5   5/5   Toe extensors  5/5   5/5   Toe flexors  5/5   5/5   Tone (Ashworth scale)  0  0   MSRs:  Reflexes are 3+/4 throughout except 2+/4 at the Achilles bilaterally.  SENSORY: Reduced temperature over the left fourth and fifth digits.  Vibration is intact throughout.    COORDINATION/GAIT:  Normal finger-to- nose-finger.  Finger tapping is slowed on the left due to weakness.  Gait appears antalgic due to hip pain, unassisted and stable.    Data: n/a  IMPRESSION/PLAN: Left hand weakness, atrophy, and paresthesias involving the ulnar distribution.   I have a high clinical suspicion for ulnar neuropathy based on the distribution of signs and symptoms.  Patient was scheduled for electrodiagnostic testing today, however this was prematurely terminated as patient was unable to tolerate testing.  I will check ultrasound of the left ulnar nerve.  Additionally, with his exam showing  hyperreflexia, MRI cervical spine wo contrast will be ordered to evaluate for compressive pathology.  In the meantime, start occupational therapy for hand strengthening.  Further recommendations pending results.  Thank you for allowing me to participate in patient's care.  If I can answer any additional questions, I would be pleased to do so.    Sincerely,    Johnice Riebe K. Allena KatzPatel, DO

## 2018-08-15 NOTE — Progress Notes (Signed)
NCS/EMG was terminated at patient's request due to pain.

## 2018-08-15 NOTE — Patient Instructions (Addendum)
1.  MRI cervical spine without contrast  We will schedule this at [facility].  2.  Ultrasound of the left ulnar nerve  This will be Scheduled at [facility], they will contact you 3.  Start occupational therapy  We will refer you to have therapy done on your hand.  You should receive a call from the facility.  We will call you with the results and decide the next step  We have sent a referral to [facility] for your Ultrasound and they will call you directly to schedule your appointment. They are located at [place]. If you need to contact them directly please call [phone].

## 2018-08-19 ENCOUNTER — Telehealth: Payer: Self-pay

## 2018-08-19 NOTE — Telephone Encounter (Signed)
[facility] does not do occupational therapy.  They will forward referral to [facility]

## 2022-12-01 NOTE — Telephone Encounter (Signed)
 LM on VM with information as noted by provider. Left office phone number for questions. KSBohlen, RN

## 2023-01-22 NOTE — Progress Notes (Signed)
 Arrhythmia Consult   PCP: Vaughan Jesus Pouch, MD Primary Cardiologist: Dr. Raylene Consulting Electrophysiologist:Jahmel CHRISTELLA Needle, MD  Active Problems: No Active Problems: There are no active problems currently on the Problem List. Please update the Problem List and refresh.   Arrhythmia Assessment & Plan: Assessment: 1.  History of atrial flutter status post ablation in 2017 now with atrial fibrillation with minimal symptoms and intermittent occurrence.  Actual burden has not been quantitated as best I can tell but he did have A-fib on 2 separate EKGs and October and November and today's EKG shows sinus rhythm.  He is on amiodarone  and at 200 mg, level is not quite therapeutic.  We will check a Zio patch monitor to quantitate burden of atrial fibrillation and then probably increase the dose to 300 mg a day and see if that would control the rhythm better.  He has no symptoms of the atrial fibrillation at present.  I briefly discussed catheter ablation of atrial fibrillation with him but would like to see either higher volumes or consider whether he is having problems with amiodarone  that would limit utility of amiodarone  to use it therefore once we get the monitor I will probably increase the dose of amiodarone  to 300 today.  He will follow-up with me in [place] and we will set up a follow-up visit in 3 months with the midlevel in [place] in 6-8 months with me. 2.  Hypertension-controlled with current medicines 3.  Hyperlipidemia-stable on statin 4.  History of dilated cardiomyopathy with improvement in LV ejection fraction to 50-55% on most recent echo-question of some alcohol related myopathic problems in the past.  He did have a heart catheterization in 2017 that showed nonobstructive coronary disease.  No history of myocardial infarction.  The episodes of atrial fibrillation that he is having on amiodarone  have rate control so he is not having markedly elevated heart rates but again we do  not have more data on that other than a 10-second EKG.  Plan: 2-week Zio patch monitor After Zio patch and, consider increasing amiodarone  to 300 daily  History of the present illness:  Treylon Henard is a 62 y.o. 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  He has been demonstrating intermittent episodes of atrial fibrillation of unknown duration because he is not symptomatic but 2 separate EKGs 1 in October and 1 in November showed atrial fibrillation with controlled ventricular response.  He is in normal rhythm today.  He does not feel the atrial fibrillation and does not feel that he is out of rhythm.  He had the atrial flutter ablation done at [facility] in 2017 and at that time he also had coronary angiography done that showed nonobstructive coronary disease.  He does have hyperlipidemia and is on atorvastatin .  With the atrial fibrillation, he is currently on Eliquis  5 twice daily for the last few months and he had been on amiodarone  antidating the uncovering of the new episodes of atrial fibrillation.  Level checked in November showed slightly subtherapeutic with amiodarone  of 750 and breakdown product of 850.  Therapeutic would be above at thousand for both.  I do not see any recent Zio patch monitor so we will get a Zio patch to quantitate burden and look at heart rate response in atrial fibrillation.  After that, we will probably increase dose to 300 daily and see if that helps.  Blood pressure  is generally controlled on current medicines which include lisinopril and Lasix .  He is on diltiazem CD1 20 and carvedilol  6.25 twice daily for rate control.  He denies orthopnea or paroxysmal nocturnal dyspnea.  He has history of chronic hip problems with multiple surgeries and has a very altered gait as a result of his chronic surgeries.   Allergies  Allergen Reactions   . Codeine GI Intolerance    Pt stated that medication do not bother him anymore.    Medications:  Prior to Admission medications   Medication Sig Start Date End Date Taking? Authorizing Provider  amiodarone  (PACERONE ) 200 mg tablet Take 1 tablet (200 mg total) by mouth daily. 01/01/23 01/01/24 Yes Hoy Sebastian Glatter, NP  aspirin  81 mg chewable tablet Chew 1 tablet (81 mg total) daily. 01/01/23 01/01/24 Yes Hoy Sebastian Glatter, NP  atorvastatin  (LIPITOR) 80 mg tablet Take 1 tablet (80 mg total) by mouth daily. 12/18/22  Yes Hoy Sebastian Glatter, NP  carvediloL  (COREG ) 6.25 mg tablet Take 6.25 mg by mouth 2 (two) times a day. 11/06/16  Yes HISTORICAL PROVIDER, [facility]  dilTIAZem (CARDIZEM CD) 120 mg 24 hr capsule Take 1 capsule (120 mg total) by mouth daily. 12/18/22  Yes Hoy Sebastian Glatter, NP  Eliquis  5 mg tab Take 1 tablet (5 mg total) by mouth 2 (two) times a day Indications: treatment to prevent blood clots in chronic atrial fibrillation. 12/18/22  Yes Hoy Sebastian Glatter, NP  lisinopriL (PRINIVIL) 10 mg tablet TAKE 1 TABLET BY MOUTH EVERY DAY 02/17/22  Yes Debby Isla Manner, MD  magnesium oxide 400 mg (241 mg magnesium) tab Take 1 tablet (400 mg total) by mouth daily. 12/18/22  Yes Hoy Sebastian Glatter, NP  potassium chloride  (KLOR-CON ) 10 mEq ER tablet Take 1 tablet (10 mEq total) by mouth daily. 05/02/22 05/02/23 Yes Debby Isla Manner, MD  ProAir  HFA 90 mcg/actuation inhaler Once Daily. 08/23/17  Yes HISTORICAL PROVIDER, [facility]  Trelegy Ellipta 100-62.5-25 mcg inhaler Inhale 1 puff Once Daily. 12/17/21  Yes HISTORICAL PROVIDER, [facility]  furosemide  (LASIX ) 20 mg tablet Take 20 mg by mouth Once Daily for 4 doses. Patient not taking: Reported on 11/21/2022 01/23/22 11/21/22  Debby Isla Manner, MD  furosemide  (LASIX ) 20 mg tablet Take 1 tablet (20 mg total) by mouth daily. Patient not taking: Reported on 01/22/2023 05/10/22 02/04/23   Debby Isla Manner, MD    Past Medical History:  Diagnosis Date  . CHF (congestive heart failure) (CMD)   . COPD (chronic obstructive pulmonary disease) (CMD)   . Diverticulitis   . GERD (gastroesophageal reflux disease)   . Heart failure (CMD)   . High cholesterol   . Hypertension with goal to be determined   . Lung disease     Past Surgical History:  Procedure Laterality Date  . BILATERAL HIP REPLACEMENT-ANTERIOR APPROACH     Procedure: BILATERAL HIP REPLACEMENT-ANTERIOR APPROACH  . COLON SURGERY     Procedure: COLON SURGERY    Social History: Social History   Tobacco Use  Smoking Status Every Day  . Current packs/day: 0.50  . Average packs/day: 0.5 packs/day for 54.9 years (27.5 ttl pk-yrs)  . Types: Cigarettes  . Start date: 02/14/1968  Smokeless Tobacco Never   Social History   Substance and Sexual Activity  Alcohol Use No   Social History   Substance and Sexual Activity  Drug Use No    No family history on file.    Review of Systems: GENERAL:  Negative for glasses,  dentures, night sweats, fevers, chills, weight gain, or weight loss.     EYES:  Negative for itchy eyes, retinopathy, cataracts, and macular degeneration.     ENT:  Negative for allergies, hearing deficits, and sinus infections.     CVS  See History and Present Illness.   RESPIRATORY:  Negative for cough, sputum, hemoptysis, pleurisy, TB, and asthma.     GI:  Negative for abdominal pain, PUD, heartburn, GI bleeding, nausea, vomiting, diarrhea, constipation, and hemorrhoids.     GU:  Negative for dysuria, frequency, hematuria, renal failure, nocturia, and stress incontinence.     MS:  Negative for joint pain, muscle pain, or bone fractures.    History of chronic hip problems and prior surgeries IINTEGUMENT:  Negative for skin cancers, rashes, lesions, psoriasis, shingles, and easy bruising.    NEUROLOGIC:  Negative for headache, stroke, TIA's, seizures, and head trauma.     PSYCHIATRIC:   Negative for depression and anxiety.     ENDOCRINE:  Negative for thyroid disorder, diabetes mellitus, and goiter.     HEMATOLOGIC/LYMPHATIC:  No bruising, bleeding, or cancer history.     ALLERGIC/IMMUNOLOGIC:  Negative for connective tissue disorder and HIV.     All other systems were reviewed and were negative in detail except as noted in the HPI.   Objective  Physical Exam: Blood pressure 102/60 pulse 62 and regular weight 202 pounds Well-developed well-nourished  male in no distress alert and oriented. HEENT: Atraumatic, normocephalic, external ocular muscles intact, pupils react to light and accommodation, nonicteric sclera. Neck: No jugular venous distention, carotids 2+ without bruit no thyromegaly or lymphadenopathy. Chest: Clear to auscultation Heart: Regular rate and rhythm, normal S1-S2, no S3-S4, no murmur appreciated.   Abdomen: Bowel sounds present, no organomegaly, nontender Extremities: No clubbing or cyanosis or edema. Neuro: Cranial nerves appear intact, normal strength and tone in his upper extremities, abnormal gait. Labs:  No results for input(s): CKTOTAL, CKMB, CKMBINDEX in the last 72 hours.  Invalid input(s): TROPONINI     Invalid input(s): ADJUSTEDWBC.     Invalid input(s): C02 Lab Results  Component Value Date   HDL 33 (L) 04/15/2021

## 2023-03-05 ENCOUNTER — Other Ambulatory Visit (HOSPITAL_COMMUNITY): Payer: Self-pay | Admitting: Orthopedic Surgery

## 2023-03-05 DIAGNOSIS — M25551 Pain in right hip: Secondary | ICD-10-CM

## 2023-03-16 ENCOUNTER — Encounter (HOSPITAL_COMMUNITY): Admission: RE | Admit: 2023-03-16 | Payer: 59 | Source: Ambulatory Visit

## 2023-03-16 ENCOUNTER — Encounter (HOSPITAL_COMMUNITY): Payer: 59

## 2023-03-21 ENCOUNTER — Encounter (HOSPITAL_COMMUNITY)
Admission: RE | Admit: 2023-03-21 | Discharge: 2023-03-21 | Disposition: A | Discharge status: Home / Self Care | Payer: 59 | Source: Ambulatory Visit | Attending: Orthopedic Surgery | Admitting: Orthopedic Surgery

## 2023-03-21 DIAGNOSIS — M25551 Pain in right hip: Secondary | ICD-10-CM | POA: Insufficient documentation

## 2023-03-23 MED ORDER — TECHNETIUM TC 99M MEDRONATE IV KIT
20.0000 | PACK | Freq: Once | INTRAVENOUS | Status: AC | PRN
  Administered 2023-03-21: 20 via INTRAVENOUS

## 2023-05-24 NOTE — Telephone Encounter (Signed)
 Called no advise no answer. Pt needs to reschedule monitor, will forward to scheduling to contact pt.

## 2023-05-24 NOTE — Telephone Encounter (Signed)
 This is a Dr. Epifanio patient in [place].  Consulted on 01/22/2023 with order for ZIO to determine a-fib burden and possible increase in amiodarone . He did not wear monitor. Had an appointment with Hoy today to discuss those results but he cancelled his appointment. We had sent message earlier to scheduling to get him back on schedule for monitor placement. Seems from this message he is not understanding the plan of care from his visit with Dr. Epifanio. Please advise patient.   Burnard, RN

## 2023-08-20 NOTE — Telephone Encounter (Signed)
**  TRIAGE CALL**  Reason for Call: pt calling to speak to someone about his atorvastatin   He said Hoy stopped it and he didn't know anything about it  Pls call to advise  Patient Complaint/Problem:  Onset Date:  Cardiologist/Vascular Surgeon:Mitchell  Best Call Back [phone]

## 2023-08-20 NOTE — Telephone Encounter (Signed)
 Spoke with pt and advised that we called the pharmacy to check on his medication. They reported that they had the medication on file and asked if they need to go ahead and fill it. Advised pt that they were advised to get it off file and go ahead and fill it, he verbalized understanding.

## 2023-08-27 ENCOUNTER — Encounter (HOSPITAL_COMMUNITY): Payer: Self-pay

## 2023-08-27 ENCOUNTER — Inpatient Hospital Stay (HOSPITAL_COMMUNITY)
Admission: EM | Admit: 2023-08-27 | Discharge: 2023-09-26 | DRG: 871 | Disposition: A | Discharge status: Home / Self Care | Attending: Internal Medicine | Admitting: Internal Medicine

## 2023-08-27 ENCOUNTER — Emergency Department (HOSPITAL_COMMUNITY)

## 2023-08-27 ENCOUNTER — Other Ambulatory Visit: Payer: Self-pay

## 2023-08-27 ENCOUNTER — Inpatient Hospital Stay (HOSPITAL_COMMUNITY)

## 2023-08-27 DIAGNOSIS — K222 Esophageal obstruction: Secondary | ICD-10-CM | POA: Diagnosis not present

## 2023-08-27 DIAGNOSIS — I480 Paroxysmal atrial fibrillation: Secondary | ICD-10-CM | POA: Diagnosis present

## 2023-08-27 DIAGNOSIS — I4891 Unspecified atrial fibrillation: Secondary | ICD-10-CM | POA: Diagnosis present

## 2023-08-27 DIAGNOSIS — F17210 Nicotine dependence, cigarettes, uncomplicated: Secondary | ICD-10-CM | POA: Diagnosis present

## 2023-08-27 DIAGNOSIS — Z1152 Encounter for screening for COVID-19: Secondary | ICD-10-CM | POA: Diagnosis not present

## 2023-08-27 DIAGNOSIS — J90 Pleural effusion, not elsewhere classified: Secondary | ICD-10-CM | POA: Diagnosis not present

## 2023-08-27 DIAGNOSIS — T368X5A Adverse effect of other systemic antibiotics, initial encounter: Secondary | ICD-10-CM | POA: Diagnosis present

## 2023-08-27 DIAGNOSIS — I129 Hypertensive chronic kidney disease with stage 1 through stage 4 chronic kidney disease, or unspecified chronic kidney disease: Secondary | ICD-10-CM | POA: Diagnosis present

## 2023-08-27 DIAGNOSIS — E43 Unspecified severe protein-calorie malnutrition: Secondary | ICD-10-CM | POA: Insufficient documentation

## 2023-08-27 DIAGNOSIS — K838 Other specified diseases of biliary tract: Secondary | ICD-10-CM | POA: Diagnosis present

## 2023-08-27 DIAGNOSIS — T18128A Food in esophagus causing other injury, initial encounter: Secondary | ICD-10-CM | POA: Diagnosis present

## 2023-08-27 DIAGNOSIS — E875 Hyperkalemia: Secondary | ICD-10-CM | POA: Diagnosis present

## 2023-08-27 DIAGNOSIS — W44F3XA Food entering into or through a natural orifice, initial encounter: Secondary | ICD-10-CM | POA: Diagnosis present

## 2023-08-27 DIAGNOSIS — F1011 Alcohol abuse, in remission: Secondary | ICD-10-CM | POA: Diagnosis present

## 2023-08-27 DIAGNOSIS — E785 Hyperlipidemia, unspecified: Secondary | ICD-10-CM | POA: Diagnosis present

## 2023-08-27 DIAGNOSIS — A419 Sepsis, unspecified organism: Principal | ICD-10-CM | POA: Diagnosis present

## 2023-08-27 DIAGNOSIS — J441 Chronic obstructive pulmonary disease with (acute) exacerbation: Secondary | ICD-10-CM | POA: Diagnosis present

## 2023-08-27 DIAGNOSIS — R339 Retention of urine, unspecified: Secondary | ICD-10-CM | POA: Diagnosis not present

## 2023-08-27 DIAGNOSIS — R933 Abnormal findings on diagnostic imaging of other parts of digestive tract: Secondary | ICD-10-CM | POA: Diagnosis not present

## 2023-08-27 DIAGNOSIS — J9601 Acute respiratory failure with hypoxia: Secondary | ICD-10-CM | POA: Diagnosis present

## 2023-08-27 DIAGNOSIS — N183 Chronic kidney disease, stage 3 unspecified: Secondary | ICD-10-CM | POA: Diagnosis not present

## 2023-08-27 DIAGNOSIS — J9600 Acute respiratory failure, unspecified whether with hypoxia or hypercapnia: Secondary | ICD-10-CM | POA: Diagnosis not present

## 2023-08-27 DIAGNOSIS — Z79899 Other long term (current) drug therapy: Secondary | ICD-10-CM

## 2023-08-27 DIAGNOSIS — I2489 Other forms of acute ischemic heart disease: Secondary | ICD-10-CM | POA: Diagnosis present

## 2023-08-27 DIAGNOSIS — J449 Chronic obstructive pulmonary disease, unspecified: Secondary | ICD-10-CM | POA: Diagnosis not present

## 2023-08-27 DIAGNOSIS — R1310 Dysphagia, unspecified: Secondary | ICD-10-CM | POA: Diagnosis present

## 2023-08-27 DIAGNOSIS — R7401 Elevation of levels of liver transaminase levels: Secondary | ICD-10-CM | POA: Diagnosis not present

## 2023-08-27 DIAGNOSIS — K224 Dyskinesia of esophagus: Secondary | ICD-10-CM | POA: Diagnosis present

## 2023-08-27 DIAGNOSIS — J440 Chronic obstructive pulmonary disease with acute lower respiratory infection: Secondary | ICD-10-CM | POA: Diagnosis present

## 2023-08-27 DIAGNOSIS — N170 Acute kidney failure with tubular necrosis: Secondary | ICD-10-CM | POA: Diagnosis present

## 2023-08-27 DIAGNOSIS — I5023 Acute on chronic systolic (congestive) heart failure: Secondary | ICD-10-CM | POA: Diagnosis not present

## 2023-08-27 DIAGNOSIS — E872 Acidosis, unspecified: Secondary | ICD-10-CM | POA: Diagnosis present

## 2023-08-27 DIAGNOSIS — J189 Pneumonia, unspecified organism: Secondary | ICD-10-CM | POA: Diagnosis not present

## 2023-08-27 DIAGNOSIS — N1831 Chronic kidney disease, stage 3a: Secondary | ICD-10-CM | POA: Diagnosis present

## 2023-08-27 DIAGNOSIS — R6521 Severe sepsis with septic shock: Secondary | ICD-10-CM | POA: Diagnosis present

## 2023-08-27 DIAGNOSIS — I110 Hypertensive heart disease with heart failure: Secondary | ICD-10-CM | POA: Diagnosis not present

## 2023-08-27 DIAGNOSIS — I2510 Atherosclerotic heart disease of native coronary artery without angina pectoris: Secondary | ICD-10-CM | POA: Diagnosis present

## 2023-08-27 DIAGNOSIS — J154 Pneumonia due to other streptococci: Secondary | ICD-10-CM | POA: Diagnosis present

## 2023-08-27 DIAGNOSIS — I420 Dilated cardiomyopathy: Secondary | ICD-10-CM | POA: Diagnosis present

## 2023-08-27 DIAGNOSIS — I5033 Acute on chronic diastolic (congestive) heart failure: Secondary | ICD-10-CM | POA: Diagnosis present

## 2023-08-27 DIAGNOSIS — Z789 Other specified health status: Secondary | ICD-10-CM

## 2023-08-27 DIAGNOSIS — R739 Hyperglycemia, unspecified: Secondary | ICD-10-CM | POA: Diagnosis not present

## 2023-08-27 DIAGNOSIS — K449 Diaphragmatic hernia without obstruction or gangrene: Secondary | ICD-10-CM | POA: Diagnosis present

## 2023-08-27 DIAGNOSIS — A408 Other streptococcal sepsis: Secondary | ICD-10-CM | POA: Diagnosis present

## 2023-08-27 DIAGNOSIS — Z7982 Long term (current) use of aspirin: Secondary | ICD-10-CM

## 2023-08-27 DIAGNOSIS — E871 Hypo-osmolality and hyponatremia: Secondary | ICD-10-CM | POA: Diagnosis present

## 2023-08-27 DIAGNOSIS — Z7901 Long term (current) use of anticoagulants: Secondary | ICD-10-CM | POA: Diagnosis not present

## 2023-08-27 DIAGNOSIS — Z833 Family history of diabetes mellitus: Secondary | ICD-10-CM

## 2023-08-27 DIAGNOSIS — N179 Acute kidney failure, unspecified: Secondary | ICD-10-CM | POA: Diagnosis not present

## 2023-08-27 DIAGNOSIS — Z6825 Body mass index (BMI) 25.0-25.9, adult: Secondary | ICD-10-CM

## 2023-08-27 DIAGNOSIS — R54 Age-related physical debility: Secondary | ICD-10-CM | POA: Diagnosis present

## 2023-08-27 DIAGNOSIS — E876 Hypokalemia: Secondary | ICD-10-CM | POA: Diagnosis present

## 2023-08-27 DIAGNOSIS — E440 Moderate protein-calorie malnutrition: Secondary | ICD-10-CM | POA: Diagnosis present

## 2023-08-27 DIAGNOSIS — K220 Achalasia of cardia: Secondary | ICD-10-CM | POA: Diagnosis not present

## 2023-08-27 DIAGNOSIS — I428 Other cardiomyopathies: Secondary | ICD-10-CM | POA: Diagnosis not present

## 2023-08-27 DIAGNOSIS — Z823 Family history of stroke: Secondary | ICD-10-CM

## 2023-08-27 DIAGNOSIS — R609 Edema, unspecified: Secondary | ICD-10-CM | POA: Diagnosis not present

## 2023-08-27 DIAGNOSIS — I5032 Chronic diastolic (congestive) heart failure: Secondary | ICD-10-CM | POA: Diagnosis not present

## 2023-08-27 DIAGNOSIS — R1314 Dysphagia, pharyngoesophageal phase: Secondary | ICD-10-CM | POA: Diagnosis present

## 2023-08-27 DIAGNOSIS — R6520 Severe sepsis without septic shock: Secondary | ICD-10-CM | POA: Diagnosis not present

## 2023-08-27 DIAGNOSIS — T380X5A Adverse effect of glucocorticoids and synthetic analogues, initial encounter: Secondary | ICD-10-CM | POA: Diagnosis present

## 2023-08-27 DIAGNOSIS — J439 Emphysema, unspecified: Secondary | ICD-10-CM | POA: Diagnosis present

## 2023-08-27 DIAGNOSIS — I5030 Unspecified diastolic (congestive) heart failure: Secondary | ICD-10-CM | POA: Diagnosis not present

## 2023-08-27 DIAGNOSIS — R1319 Other dysphagia: Secondary | ICD-10-CM | POA: Diagnosis present

## 2023-08-27 DIAGNOSIS — Z978 Presence of other specified devices: Secondary | ICD-10-CM | POA: Diagnosis not present

## 2023-08-27 DIAGNOSIS — R7989 Other specified abnormal findings of blood chemistry: Secondary | ICD-10-CM | POA: Diagnosis not present

## 2023-08-27 DIAGNOSIS — Z886 Allergy status to analgesic agent status: Secondary | ICD-10-CM

## 2023-08-27 DIAGNOSIS — I5031 Acute diastolic (congestive) heart failure: Secondary | ICD-10-CM | POA: Diagnosis not present

## 2023-08-27 DIAGNOSIS — D649 Anemia, unspecified: Secondary | ICD-10-CM | POA: Diagnosis present

## 2023-08-27 LAB — CBC WITH DIFFERENTIAL/PLATELET
Abs Immature Granulocytes: 1.02 K/uL — ABNORMAL HIGH (ref 0.00–0.07)
Basophils Absolute: 0.1 K/uL (ref 0.0–0.1)
Basophils Relative: 0 %
Eosinophils Absolute: 0 K/uL (ref 0.0–0.5)
Eosinophils Relative: 0 %
HCT: 41.3 % (ref 39.0–52.0)
Hemoglobin: 13.6 g/dL (ref 13.0–17.0)
Immature Granulocytes: 3 %
Lymphocytes Relative: 1 %
Lymphs Abs: 0.4 K/uL — ABNORMAL LOW (ref 0.7–4.0)
MCH: 26.6 pg (ref 26.0–34.0)
MCHC: 32.9 g/dL (ref 30.0–36.0)
MCV: 80.8 fL (ref 80.0–100.0)
Monocytes Absolute: 2.4 K/uL — ABNORMAL HIGH (ref 0.1–1.0)
Monocytes Relative: 7 %
Neutro Abs: 31.3 K/uL — ABNORMAL HIGH (ref 1.7–7.7)
Neutrophils Relative %: 89 %
Platelets: 570 K/uL — ABNORMAL HIGH (ref 150–400)
RBC: 5.11 MIL/uL (ref 4.22–5.81)
RDW: 16.2 % — ABNORMAL HIGH (ref 11.5–15.5)
Smear Review: NORMAL
WBC: 35.2 K/uL — ABNORMAL HIGH (ref 4.0–10.5)
nRBC: 0 % (ref 0.0–0.2)

## 2023-08-27 LAB — COMPREHENSIVE METABOLIC PANEL WITH GFR
ALT: 32 U/L (ref 0–44)
AST: 60 U/L — ABNORMAL HIGH (ref 15–41)
Albumin: 1.8 g/dL — ABNORMAL LOW (ref 3.5–5.0)
Alkaline Phosphatase: 110 U/L (ref 38–126)
Anion gap: 18 — ABNORMAL HIGH (ref 5–15)
BUN: 63 mg/dL — ABNORMAL HIGH (ref 8–23)
CO2: 22 mmol/L (ref 22–32)
Calcium: 7.9 mg/dL — ABNORMAL LOW (ref 8.9–10.3)
Chloride: 95 mmol/L — ABNORMAL LOW (ref 98–111)
Creatinine, Ser: 1.95 mg/dL — ABNORMAL HIGH (ref 0.61–1.24)
GFR, Estimated: 38 mL/min — ABNORMAL LOW (ref >60)
Glucose, Bld: 101 mg/dL — ABNORMAL HIGH (ref 70–99)
Potassium: 3.7 mmol/L (ref 3.5–5.1)
Sodium: 135 mmol/L (ref 135–145)
Total Bilirubin: 1.6 mg/dL — ABNORMAL HIGH (ref 0.0–1.2)
Total Protein: 6.7 g/dL (ref 6.5–8.1)

## 2023-08-27 LAB — I-STAT VENOUS BLOOD GAS, ED
Acid-Base Excess: 2 mmol/L (ref 0.0–2.0)
Bicarbonate: 26.4 mmol/L (ref 20.0–28.0)
Calcium, Ion: 0.99 mmol/L — ABNORMAL LOW (ref 1.15–1.40)
HCT: 41 % (ref 39.0–52.0)
Hemoglobin: 13.9 g/dL (ref 13.0–17.0)
O2 Saturation: 76 %
Potassium: 3.3 mmol/L — ABNORMAL LOW (ref 3.5–5.1)
Sodium: 139 mmol/L (ref 135–145)
TCO2: 28 mmol/L (ref 22–32)
pCO2, Ven: 38.4 mmHg — ABNORMAL LOW (ref 44–60)
pH, Ven: 7.444 — ABNORMAL HIGH (ref 7.25–7.43)
pO2, Ven: 39 mmHg (ref 32–45)

## 2023-08-27 LAB — TROPONIN I (HIGH SENSITIVITY)
Troponin I (High Sensitivity): 29 ng/L — ABNORMAL HIGH (ref <18)
Troponin I (High Sensitivity): 42 ng/L — ABNORMAL HIGH (ref <18)

## 2023-08-27 LAB — SARS CORONAVIRUS 2 BY RT PCR: SARS Coronavirus 2 by RT PCR: NEGATIVE

## 2023-08-27 LAB — PROTIME-INR
INR: 1.5 — ABNORMAL HIGH (ref 0.8–1.2)
Prothrombin Time: 19 s — ABNORMAL HIGH (ref 11.4–15.2)

## 2023-08-27 LAB — BRAIN NATRIURETIC PEPTIDE: B Natriuretic Peptide: 219.6 pg/mL — ABNORMAL HIGH (ref 0.0–100.0)

## 2023-08-27 LAB — MAGNESIUM: Magnesium: 2.9 mg/dL — ABNORMAL HIGH (ref 1.7–2.4)

## 2023-08-27 LAB — TYPE AND SCREEN
ABO/RH(D): O NEG
Antibody Screen: NEGATIVE

## 2023-08-27 LAB — I-STAT CG4 LACTIC ACID, ED
Lactic Acid, Venous: 2.4 mmol/L (ref 0.5–1.9)
Lactic Acid, Venous: 2.4 mmol/L (ref 0.5–1.9)

## 2023-08-27 MED ORDER — POTASSIUM CHLORIDE CRYS ER 20 MEQ PO TBCR
20.0000 meq | EXTENDED_RELEASE_TABLET | Freq: Two times a day (BID) | ORAL | Status: AC
  Administered 2023-08-27 – 2023-08-28 (×2): 20 meq via ORAL
  Filled 2023-08-27: qty 1
  Filled 2023-08-27: qty 2

## 2023-08-27 MED ORDER — PROCHLORPERAZINE EDISYLATE 10 MG/2ML IJ SOLN: 5.0000 mg | Freq: Four times a day (QID) | INTRAMUSCULAR | Status: DC | PRN

## 2023-08-27 MED ORDER — ACETAMINOPHEN 325 MG PO TABS
650.0000 mg | ORAL_TABLET | Freq: Four times a day (QID) | ORAL | Status: AC | PRN
  Administered 2023-08-28 – 2023-09-02 (×4): 650 mg via ORAL
  Filled 2023-08-27 (×4): qty 2

## 2023-08-27 MED ORDER — SODIUM CHLORIDE 0.9 % IV SOLN
1.0000 g | Freq: Once | INTRAVENOUS | Status: AC
  Administered 2023-08-27: 1 g via INTRAVENOUS
  Filled 2023-08-27: qty 10

## 2023-08-27 MED ORDER — SODIUM CHLORIDE 0.9 % IV SOLN
2.0000 g | INTRAVENOUS | Status: DC
  Administered 2023-08-28: 2 g via INTRAVENOUS
  Filled 2023-08-27: qty 20

## 2023-08-27 MED ORDER — MELATONIN 5 MG PO TABS
5.0000 mg | ORAL_TABLET | Freq: Every evening | ORAL | Status: DC | PRN
  Administered 2023-08-28 – 2023-09-02 (×3): 5 mg via ORAL
  Filled 2023-08-27 (×3): qty 1

## 2023-08-27 MED ORDER — SODIUM CHLORIDE 0.9 % IV SOLN
500.0000 mg | INTRAVENOUS | Status: DC
  Administered 2023-08-28: 500 mg via INTRAVENOUS
  Filled 2023-08-27 (×2): qty 5

## 2023-08-27 MED ORDER — IPRATROPIUM-ALBUTEROL 0.5-2.5 (3) MG/3ML IN SOLN
3.0000 mL | Freq: Four times a day (QID) | RESPIRATORY_TRACT | Status: DC
  Administered 2023-08-28 – 2023-08-29 (×4): 3 mL via RESPIRATORY_TRACT
  Filled 2023-08-27 (×4): qty 3

## 2023-08-27 MED ORDER — SODIUM CHLORIDE 0.9 % IV BOLUS: 1000.0000 mL | Freq: Once | INTRAVENOUS | Status: DC

## 2023-08-27 MED ORDER — SODIUM CHLORIDE 0.9 % IV SOLN
500.0000 mg | Freq: Once | INTRAVENOUS | Status: AC
  Administered 2023-08-27: 500 mg via INTRAVENOUS
  Filled 2023-08-27: qty 5

## 2023-08-27 MED ORDER — ENOXAPARIN SODIUM 30 MG/0.3ML IJ SOSY: 30.0000 mg | PREFILLED_SYRINGE | INTRAMUSCULAR | Status: DC

## 2023-08-27 MED ORDER — POLYETHYLENE GLYCOL 3350 17 G PO PACK
17.0000 g | PACK | Freq: Every day | ORAL | Status: DC | PRN
  Administered 2023-09-01: 17 g via ORAL
  Filled 2023-08-27 (×2): qty 1

## 2023-08-27 MED ORDER — AMIODARONE HCL 200 MG PO TABS
200.0000 mg | ORAL_TABLET | Freq: Every day | ORAL | Status: DC
  Administered 2023-08-28 – 2023-09-06 (×11): 200 mg via ORAL
  Filled 2023-08-27 (×11): qty 1

## 2023-08-27 MED ORDER — SPIRONOLACTONE 12.5 MG HALF TABLET: 25.0000 mg | ORAL_TABLET | Freq: Every morning | ORAL | Status: DC

## 2023-08-27 MED ORDER — LACTATED RINGERS IV BOLUS
1000.0000 mL | Freq: Once | INTRAVENOUS | Status: AC
  Administered 2023-08-27: 1000 mL via INTRAVENOUS

## 2023-08-27 MED ORDER — GUAIFENESIN-DM 100-10 MG/5ML PO SYRP
5.0000 mL | ORAL_SOLUTION | ORAL | Status: DC | PRN
  Administered 2023-08-29 – 2023-09-06 (×9): 5 mL via ORAL
  Filled 2023-08-27 (×9): qty 5

## 2023-08-27 MED ORDER — APIXABAN 5 MG PO TABS
5.0000 mg | ORAL_TABLET | Freq: Two times a day (BID) | ORAL | Status: DC
  Administered 2023-08-27 – 2023-09-06 (×21): 5 mg via ORAL
  Filled 2023-08-27 (×21): qty 1

## 2023-08-27 MED ORDER — ASPIRIN 81 MG PO CHEW
81.0000 mg | CHEWABLE_TABLET | Freq: Every day | ORAL | Status: DC
  Administered 2023-08-28 – 2023-08-29 (×2): 81 mg via ORAL
  Filled 2023-08-27 (×3): qty 1

## 2023-08-27 NOTE — ED Provider Notes (Signed)
 [place] EMERGENCY DEPARTMENT AT [facility] Provider Note   CSN: [id number] Arrival date & time: 08/27/23  1454     Patient presents with: Shortness of Breath   [name] is a 63 y.o. male.  He has a history of atrial fibrillation on Eliquis  and amiodarone .  Congestive heart failure.  He is here with a complaint of 1 week of increased shortness of breath.  Minimal cough no hemoptysis.  No fevers or chills.  No significant chest pain.  He said he quit smoking about a week ago due to his shortness of breath.  No vomiting or diarrhea.  Does not normally use oxygen.  EMS found him with a room air sat of 80% and gave him oxygen Solu-Medrol  aspirin  and DuoNeb.  Blood pressure was low.  {Add pertinent medical, surgical, social history, OB history to YEP:67052} The history is provided by the patient.  Shortness of Breath Severity:  Moderate Onset quality:  Gradual Duration:  1 week Timing:  Constant Progression:  Worsening Chronicity:  Recurrent Relieved by:  Nothing Worsened by:  Activity Ineffective treatments:  Rest Associated symptoms: cough   Associated symptoms: no abdominal pain, no chest pain, no fever, no hemoptysis, no sputum production, no vomiting and no wheezing   Risk factors: tobacco use        Prior to Admission medications   Medication Sig Start Date End Date Taking? Authorizing Provider  albuterol  (PROVENTIL ) (2.5 MG/3ML) 0.083% nebulizer solution TAKE 1 INHALATION MILLILITER 4 TIMES A DAY 05/20/18   [provider]  amiodarone  (PACERONE ) 200 MG tablet Take by mouth. 08/02/16   [provider]  apixaban  (ELIQUIS ) 5 MG TABS tablet Take by mouth. 09/04/17   [provider]  aspirin  81 MG chewable tablet Chew by mouth. 05/03/16   [provider]  atorvastatin  (LIPITOR) 80 MG tablet Take by mouth. 10/09/16 09/04/18  [provider]  carvedilol  (COREG ) 6.25 MG tablet Take 6.25 mg by mouth 2 (two) times daily.     [provider]  fluticasone  (FLONASE ) 50 MCG/ACT nasal spray Place 2 sprays into the nose daily as needed for allergies.    [provider]  furosemide  (LASIX ) 20 MG tablet Take 20 mg by mouth daily.    [provider]  lisinopril (PRINIVIL,ZESTRIL) 2.5 MG tablet Take 2.5 mg by mouth every morning.    [provider]  magnesium oxide (MAG-OX) 400 MG tablet Take by mouth. 05/01/18   [provider]  oxyCODONE -acetaminophen  (PERCOCET) 10-325 MG tablet as needed. 05/10/17   [provider]  spironolactone  (ALDACTONE ) 25 MG tablet Take 25 mg by mouth every morning.    [provider]  traMADol  (ULTRAM ) 50 MG tablet Take 1-2 tablets (50-100 mg total) by mouth every 6 (six) hours as needed (mild pain). 05/17/12   Perkins, Alexzandrew L, PA-C    Allergies: Codeine    Review of Systems  Constitutional:  Negative for fever.  Eyes:  Negative for visual disturbance.  Respiratory:  Positive for cough and shortness of breath. Negative for hemoptysis, sputum production and wheezing.   Cardiovascular:  Negative for chest pain.  Gastrointestinal:  Negative for abdominal pain, nausea and vomiting.  Genitourinary:  Negative for dysuria.    Updated Vital Signs BP (!) 77/61   Pulse (!) 125   Temp 98.2 F (36.8 C)   Resp (!) 25   SpO2 94%   Physical Exam Vitals and nursing note reviewed.  Constitutional:      General:  He is in acute distress.     Appearance: He is well-developed.  HENT:     Head: Normocephalic and atraumatic.  Eyes:     Conjunctiva/sclera: Conjunctivae normal.  Cardiovascular:     Rate and Rhythm: Tachycardia present. Rhythm irregular.     Heart sounds: No murmur heard. Pulmonary:     Effort: Tachypnea and accessory muscle usage present. No respiratory distress.     Breath sounds: Normal breath sounds.  Abdominal:     Palpations: Abdomen is soft.     Tenderness: There is no abdominal tenderness. There is no guarding  or rebound.  Musculoskeletal:        General: No swelling.     Cervical back: Neck supple.     Right lower leg: No tenderness. Edema present.     Left lower leg: No tenderness.  Skin:    General: Skin is warm and dry.     Capillary Refill: Capillary refill takes less than 2 seconds.  Neurological:     General: No focal deficit present.     Mental Status: He is alert.     (all labs ordered are listed, but only abnormal results are displayed) Labs Reviewed  SARS CORONAVIRUS 2 BY RT PCR  CULTURE, BLOOD (ROUTINE X 2)  CULTURE, BLOOD (ROUTINE X 2)  BRAIN NATRIURETIC PEPTIDE  COMPREHENSIVE METABOLIC PANEL WITH GFR  CBC WITH DIFFERENTIAL/PLATELET  PROTIME-INR  URINALYSIS, ROUTINE W REFLEX MICROSCOPIC  I-STAT VENOUS BLOOD GAS, ED  I-STAT CG4 LACTIC ACID, ED  TYPE AND SCREEN  TROPONIN I (HIGH SENSITIVITY)    EKG: EKG Interpretation Date/Time:  Monday August 27 2023 15:03:58 EDT Ventricular Rate:  120 PR Interval:    QRS Duration:  114 QT Interval:  371 QTC Calculation: 525 R Axis:   76  Text Interpretation: Atrial fibrillation Borderline intraventricular conduction delay Prolonged QT interval Confirmed by Towana Sharper [phone]) on 08/27/2023 3:11:41 PM  Radiology: No results found.  {Document cardiac monitor, telemetry assessment procedure when appropriate:32947} Procedures   Medications Ordered in the ED - No data to display    {Click here for ABCD2, HEART and other calculators REFRESH Note before signing:1}                              Medical Decision Making Amount and/or Complexity of Data Reviewed Labs: ordered. Radiology: ordered.   This patient complains of ***; this involves an extensive number of treatment Options and is a complaint that carries with it a high risk of complications and morbidity. The differential includes ***  I ordered, reviewed and interpreted labs, which included *** I ordered medication *** and reviewed PMP when indicated. I ordered  imaging studies which included *** and I independently    visualized and interpreted imaging which showed *** Additional history obtained from *** Previous records obtained and reviewed *** I consulted *** and discussed lab and imaging findings and discussed disposition.  Cardiac monitoring reviewed, *** Social determinants considered, *** Critical Interventions: ***  After the interventions stated above, I reevaluated the patient and found *** Admission and further testing considered, ***   {Document critical care time when appropriate  Document review of labs and clinical decision tools ie CHADS2VASC2, etc  Document your independent review of radiology images and any outside records  Document your discussion with family members, caretakers and with consultants  Document social determinants of health affecting pt's care  Document your decision making why or why  not admission, treatments were needed:32947:::1}   Final diagnoses:  None    ED Discharge Orders     None

## 2023-08-27 NOTE — ED Notes (Signed)
 Patient transported to CT

## 2023-08-27 NOTE — ED Triage Notes (Signed)
 PT BIB EMS from home for shortness of breath times 2-3 days, initially called out for chest pain from coughing, 80% RA, history of CHF.  Aspirin  324 mg 2 albuterol  nebulizer Methylprednisolone   125 mg BP 88/60 20 R wrist CO2 33-35 HR 120-140 Sinus Tach 12 lead

## 2023-08-27 NOTE — ED Notes (Signed)
 CCMD called.

## 2023-08-27 NOTE — H&P (Addendum)
 History and Physical  [name] FMW:[id number] DOB: 29-Apr-1960 DOA: 08/27/2023  Referring physician: Dr. Towana, EDP  PCP: Zena Frederick, DO  Outpatient Specialists: Cardiology Patient coming from: Home   Chief Complaint: Shortness of breath.  HPI: [name] is a 63 y.o. male with medical history significant for former alcohol abuse, tobacco abuse (quit a week ago), paroxysmal A-fib on Eliquis  and amiodarone , chronic HFpEF, nonischemic cardiomyopathy, hypertension, hyperlipidemia, COPD, history of esophageal stricture and dysmotility, who presents to the ER due to 1 week of shortness of breath and progressively worsening.  Associated with a productive cough.  Appetite has been poor.  EMS was activated.  Upon EMS arrival, the patient was hypoxic with O2 saturation of 80% on room air.  He received a full dose aspirin  324 mg x 1, 2 rounds of albuterol  nebs, 1 dose of Solu-Medrol  125 mg x 1.  He was also noted to be hypotensive with SBP's in the 80s, tachycardic with heart rate in the 120s to 140s.  In the ER, tachycardic and tachypneic with soft BPs which responded well to 1 L IV fluid bolus LR.  O2 saturation improved, 93 to 95% on 3 L NS.  Chest x-ray revealed new dense right upper lobe airspace disease with associated volume loss, suspicious for pneumonia.  Differential includes reactivation tuberculosis and malignancy.  Correlate clinically.  Follow-up PA and lateral chest x-ray is recommended in 4 to 6 weeks following appropriate therapy to ensure resolution and exclude underlying malignancy.  Empiric IV antibiotics were initiated in the ER for CAP, Rocephin  and IV azithromycin .  TRH, hospitalist service, was asked to admit.  At the time of this visit, the patient is alert and oriented.  Denies any alcohol use.  States he has not had an alcoholic drink in years.  Denies exposure to tuberculosis.  No history of incarceration, homelessness, or travel outside the [place] .  ED Course:  Temperature 97.5.  BP 96/66, pulse 98, respiratory 22, O2 saturation 96% on 3 L [place].  Lab studies notable for WBC 35.2, platelet 570, neutrophil count 31.3.  BUN 63, creatinine 1.95, GFR 38.  BNP 219, troponin 29, 42.  Lactic acid 2.4, 2.4.  Review of Systems: Review of systems as noted in the HPI. All other systems reviewed and are negative.   Past Medical History:  Diagnosis Date   Anxiety    Arthritis    Cardiomyopathy (HCC)    CHF (congestive heart failure) (HCC)    COPD (chronic obstructive pulmonary disease) (HCC)    Dysphagia    with esophageal stricture   Dysrhythmia    atrial fib   GERD (gastroesophageal reflux disease)    History of blood transfusion    Hypertension    Past Surgical History:  Procedure Laterality Date   ACETABULAR REVISION Right 05/15/2012   Procedure: ACETABULAR REVISION;  Surgeon: Dempsey LULLA Moan, MD;  Location: [facility];  Service: Orthopedics;  Laterality: Right;  RIGHT HIP BEARING SURFACE VS ACETABULAR REVISION    ESOPHAGOGASTRODUODENOSCOPY     with dilitation   EYE SURGERY Right    cataract extraction with IOL   JOINT REPLACEMENT Bilateral    hips/revision right x 4    Social History:  reports that he has been smoking cigarettes. He has a 32 pack-year smoking history. He has never used smokeless tobacco. He reports that he does not drink alcohol and does not use drugs.   Allergies  Allergen Reactions   Codeine Nausea And Vomiting    Family History  Problem Relation Age of Onset   Diabetes Mother    Stroke Father       Prior to Admission medications   Medication Sig Start Date End Date Taking? Authorizing Provider  albuterol  (PROVENTIL ) (2.5 MG/3ML) 0.083% nebulizer solution TAKE 1 INHALATION MILLILITER 4 TIMES A DAY 05/20/18   [provider]  amiodarone  (PACERONE ) 200 MG tablet Take by mouth. 08/02/16   [provider]  apixaban  (ELIQUIS ) 5 MG TABS tablet Take by mouth. 09/04/17   [provider]  aspirin  81 MG  chewable tablet Chew by mouth. 05/03/16   [provider]  atorvastatin  (LIPITOR) 80 MG tablet Take by mouth. 10/09/16 09/04/18  [provider]  carvedilol  (COREG ) 6.25 MG tablet Take 6.25 mg by mouth 2 (two) times daily.    [provider]  fluticasone  (FLONASE ) 50 MCG/ACT nasal spray Place 2 sprays into the nose daily as needed for allergies.    [provider]  furosemide  (LASIX ) 20 MG tablet Take 20 mg by mouth daily.    [provider]  lisinopril (PRINIVIL,ZESTRIL) 2.5 MG tablet Take 2.5 mg by mouth every morning.    [provider]  magnesium oxide (MAG-OX) 400 MG tablet Take by mouth. 05/01/18   [provider]  oxyCODONE -acetaminophen  (PERCOCET) 10-325 MG tablet as needed. 05/10/17   [provider]  spironolactone  (ALDACTONE ) 25 MG tablet Take 25 mg by mouth every morning.    [provider]  traMADol  (ULTRAM ) 50 MG tablet Take 1-2 tablets (50-100 mg total) by mouth every 6 (six) hours as needed (mild pain). 05/17/12   Lanell, Alexzandrew L, PA-C    Physical Exam: BP 91/75   Pulse 94   Temp (!) 97.5 F (36.4 C) (Oral)   Resp 17   Ht 5' 9 (1.753 m)   SpO2 95%   BMI 28.06 kg/m   General: 63 y.o. year-old male well developed well nourished in no acute distress.  Alert and oriented x3. Cardiovascular: Regular rate and rhythm with no rubs or gallops.  No thyromegaly or JVD noted.  Trace lower extremity edema bilaterally.   Respiratory: Mild rales noted right lobe.  Poor inspiratory effort.  Persistent productive cough on exam. Abdomen: Soft nontender nondistended with normal bowel sounds x4 quadrants. Muskuloskeletal: No cyanosis or clubbing noted bilaterally Neuro: CN II-XII intact, strength, sensation, reflexes Skin: No ulcerative lesions noted or rashes Psychiatry: Judgement and insight appear normal. Mood is appropriate for condition and setting          Labs on Admission:  Basic Metabolic  Panel: Recent Labs  Lab 08/27/23 1518 08/27/23 1537 08/27/23 1551  NA 135  --  139  K 3.7  --  3.3*  CL 95*  --   --   CO2 22  --   --   GLUCOSE 101*  --   --   BUN 63*  --   --   CREATININE 1.95*  --   --   CALCIUM  7.9*  --   --   MG  --  2.9*  --    Liver Function Tests: Recent Labs  Lab 08/27/23 1518  AST 60*  ALT 32  ALKPHOS 110  BILITOT 1.6*  PROT 6.7  ALBUMIN  1.8*   No results for input(s): LIPASE, AMYLASE in the last 168 hours. No results for input(s): AMMONIA in the last 168 hours. CBC: Recent Labs  Lab 08/27/23 1518 08/27/23 1551  WBC 35.2*  --   NEUTROABS 31.3*  --  HGB 13.6 13.9  HCT 41.3 41.0  MCV 80.8  --   PLT 570*  --    Cardiac Enzymes: No results for input(s): CKTOTAL, CKMB, CKMBINDEX, TROPONINI in the last 168 hours.  BNP (last 3 results) Recent Labs    08/27/23 1518  BNP 219.6*    ProBNP (last 3 results) No results for input(s): PROBNP in the last 8760 hours.  CBG: No results for input(s): GLUCAP in the last 168 hours.  Radiological Exams on Admission: DG Chest Port 1 View Result Date: 08/27/2023 CLINICAL DATA:  Shortness of breath for 2-3 days. History of congestive heart failure and COPD. EXAM: PORTABLE CHEST 1 VIEW COMPARISON:  Radiographs 04/04/2018 and 11/11/2015. Chest CT 09/07/2022. FINDINGS: 1524 hours. Two AP semi erect views of the chest are submitted. There is new dense right upper lobe airspace disease with associated volume loss. The left lung appears clear. No evidence of pleural effusion or pneumothorax. The visualized heart size and mediastinal contours are stable with aortic atherosclerosis. No acute osseous findings are evident. Telemetry leads overlie the chest. IMPRESSION: New dense right upper lobe airspace disease with associated volume loss, suspicious for pneumonia. Differential includes reactivation tuberculosis and malignancy. Correlate clinically. Followup PA and lateral chest X-ray is  recommended in 4-6 weeks following appropriate therapy to ensure resolution and exclude underlying malignancy. Electronically Signed   By: Elsie Perone M.D.   On: 08/27/2023 15:37    EKG: I independently viewed the EKG done and my findings are as followed: Atrial fibrillation rate of 128.  Nonspecific ST-T changes.  QTc 564.  Assessment/Plan Present on Admission:  Sepsis (HCC)  Principal Problem:   Sepsis (HCC)  Sepsis secondary to right upper lobe community-acquired pneumonia versus others, POA Presented with leukocytosis, tachycardia, tachypnea, lactic acidosis. Follow baseline procalcitonin, peripheral blood cultures x2, sputum culture Follow noncontrast CT chest Continue airborne precaution until TB is ruled out Follow acid-fast smear and culture Continue empiric IV antibiotics Maintain MAP greater than 65 Maintain O2 saturation above 92%  Acute hypoxic respiratory failure secondary to the above Currently on 3 L nasal cannula with O2 saturation above 92% Wean off O2 supplementation as tolerated. Early mobilization as tolerated  Elevated troponin, suspect demand ischemia in the setting of acute hypoxic respiratory failure Troponin 29, 42, trend Follow 2D echo and monitor on telemetry  Paroxysmal A-fib on Eliquis  Resume home Eliquis  Closely monitor on telemetry  Hypokalemia Repleted orally. Magnesium 2.9  CKD 3B Unclear if this is acute or chronic, no prior records to compare Repeat BMP in the morning. Avoid nephrotoxic agents and hypotension Monitor urine output  History of esophageal stricture and dysmotility Denies having dysphagia to solids or liquids Speech therapist has been consulted to evaluate Continue aspiration precautions  Tobacco abuse Endorses quitting tobacco about a week ago  QTc prolongation Admission 12-lead EKG with QTc of 564 Optimize magnesium and potassium levels Closely monitor on telemetry    Critical care time: 65  minutes.    DVT prophylaxis: Home Eliquis   Code Status: Full code  Family Communication: None at bedside  Disposition Plan: Admitted to progressive care unit.  Consults called: None.  Admission status: Inpatient status.   Status is: Inpatient The patient requires at least 2 midnights for further evaluation and treatment of present condition.   Terry LOISE Hurst MD [facility] Pager [phone]  If 7PM-7AM, please contact night-coverage [email or web address] Password [facility]  08/27/2023, 9:50 PM

## 2023-08-28 ENCOUNTER — Inpatient Hospital Stay (HOSPITAL_COMMUNITY)

## 2023-08-28 DIAGNOSIS — J189 Pneumonia, unspecified organism: Secondary | ICD-10-CM | POA: Diagnosis not present

## 2023-08-28 DIAGNOSIS — J9601 Acute respiratory failure with hypoxia: Secondary | ICD-10-CM

## 2023-08-28 DIAGNOSIS — R7989 Other specified abnormal findings of blood chemistry: Secondary | ICD-10-CM

## 2023-08-28 DIAGNOSIS — I5032 Chronic diastolic (congestive) heart failure: Secondary | ICD-10-CM | POA: Diagnosis not present

## 2023-08-28 DIAGNOSIS — A419 Sepsis, unspecified organism: Secondary | ICD-10-CM | POA: Diagnosis not present

## 2023-08-28 DIAGNOSIS — N183 Chronic kidney disease, stage 3 unspecified: Secondary | ICD-10-CM

## 2023-08-28 DIAGNOSIS — R6521 Severe sepsis with septic shock: Secondary | ICD-10-CM

## 2023-08-28 DIAGNOSIS — I4891 Unspecified atrial fibrillation: Secondary | ICD-10-CM

## 2023-08-28 DIAGNOSIS — I428 Other cardiomyopathies: Secondary | ICD-10-CM

## 2023-08-28 DIAGNOSIS — I480 Paroxysmal atrial fibrillation: Secondary | ICD-10-CM

## 2023-08-28 LAB — ECHOCARDIOGRAM COMPLETE
AV Peak grad: 5.6 mmHg
Ao pk vel: 1.18 m/s
Area-P 1/2: 3.62 cm2
Height: 69 in

## 2023-08-28 LAB — CBC
HCT: 40.3 % (ref 39.0–52.0)
Hemoglobin: 13.1 g/dL (ref 13.0–17.0)
MCH: 26.5 pg (ref 26.0–34.0)
MCHC: 32.5 g/dL (ref 30.0–36.0)
MCV: 81.6 fL (ref 80.0–100.0)
Platelets: 507 K/uL — ABNORMAL HIGH (ref 150–400)
RBC: 4.94 MIL/uL (ref 4.22–5.81)
RDW: 16.7 % — ABNORMAL HIGH (ref 11.5–15.5)
WBC: 32.9 K/uL — ABNORMAL HIGH (ref 4.0–10.5)
nRBC: 0 % (ref 0.0–0.2)

## 2023-08-28 LAB — COMPREHENSIVE METABOLIC PANEL WITH GFR
ALT: 31 U/L (ref 0–44)
AST: 57 U/L — ABNORMAL HIGH (ref 15–41)
Albumin: 1.6 g/dL — ABNORMAL LOW (ref 3.5–5.0)
Alkaline Phosphatase: 103 U/L (ref 38–126)
Anion gap: 14 (ref 5–15)
BUN: 75 mg/dL — ABNORMAL HIGH (ref 8–23)
CO2: 24 mmol/L (ref 22–32)
Calcium: 8.1 mg/dL — ABNORMAL LOW (ref 8.9–10.3)
Chloride: 100 mmol/L (ref 98–111)
Creatinine, Ser: 1.8 mg/dL — ABNORMAL HIGH (ref 0.61–1.24)
GFR, Estimated: 42 mL/min — ABNORMAL LOW (ref >60)
Glucose, Bld: 134 mg/dL — ABNORMAL HIGH (ref 70–99)
Potassium: 4 mmol/L (ref 3.5–5.1)
Sodium: 138 mmol/L (ref 135–145)
Total Bilirubin: 1.3 mg/dL — ABNORMAL HIGH (ref 0.0–1.2)
Total Protein: 6.2 g/dL — ABNORMAL LOW (ref 6.5–8.1)

## 2023-08-28 LAB — STREP PNEUMONIAE URINARY ANTIGEN: Strep Pneumo Urinary Antigen: NEGATIVE

## 2023-08-28 LAB — URINALYSIS, ROUTINE W REFLEX MICROSCOPIC
Bilirubin Urine: NEGATIVE
Glucose, UA: 50 mg/dL — AB
Hgb urine dipstick: NEGATIVE
Ketones, ur: NEGATIVE mg/dL
Leukocytes,Ua: NEGATIVE
Nitrite: NEGATIVE
Protein, ur: 30 mg/dL — AB
Specific Gravity, Urine: 1.015 (ref 1.005–1.030)
pH: 5 (ref 5.0–8.0)

## 2023-08-28 LAB — MAGNESIUM: Magnesium: 2.9 mg/dL — ABNORMAL HIGH (ref 1.7–2.4)

## 2023-08-28 LAB — EXPECTORATED SPUTUM ASSESSMENT W GRAM STAIN, RFLX TO RESP C

## 2023-08-28 LAB — MRSA NEXT GEN BY PCR, NASAL: MRSA by PCR Next Gen: NOT DETECTED

## 2023-08-28 LAB — PHOSPHORUS: Phosphorus: 6 mg/dL — ABNORMAL HIGH (ref 2.5–4.6)

## 2023-08-28 LAB — LACTIC ACID, PLASMA
Lactic Acid, Venous: 1.4 mmol/L (ref 0.5–1.9)
Lactic Acid, Venous: 1.6 mmol/L (ref 0.5–1.9)

## 2023-08-28 LAB — PROCALCITONIN: Procalcitonin: 3.28 ng/mL

## 2023-08-28 LAB — HIV ANTIBODY (ROUTINE TESTING W REFLEX): HIV Screen 4th Generation wRfx: NONREACTIVE

## 2023-08-28 MED ORDER — LACTATED RINGERS IV BOLUS
1000.0000 mL | Freq: Once | INTRAVENOUS | Status: AC
  Administered 2023-08-28: 1000 mL via INTRAVENOUS

## 2023-08-28 MED ORDER — NOREPINEPHRINE 4 MG/250ML-% IV SOLN
0.0000 ug/min | INTRAVENOUS | Status: DC
  Administered 2023-08-28 – 2023-08-29 (×2): 2 ug/min via INTRAVENOUS
  Filled 2023-08-28 (×2): qty 250

## 2023-08-28 MED ORDER — OXYCODONE-ACETAMINOPHEN 5-325 MG PO TABS
1.0000 | ORAL_TABLET | Freq: Four times a day (QID) | ORAL | Status: DC | PRN
  Administered 2023-08-28 – 2023-09-07 (×26): 1 via ORAL
  Filled 2023-08-28 (×27): qty 1

## 2023-08-28 MED ORDER — LACTATED RINGERS IV SOLN: INTRAVENOUS | Status: AC

## 2023-08-28 MED ORDER — SODIUM CHLORIDE 0.9 % IV SOLN: 250.0000 mL | INTRAVENOUS | Status: AC

## 2023-08-28 MED ORDER — CHLORHEXIDINE GLUCONATE CLOTH 2 % EX PADS
6.0000 | MEDICATED_PAD | Freq: Every day | CUTANEOUS | Status: DC
  Administered 2023-08-28 – 2023-09-01 (×5): 6 via TOPICAL

## 2023-08-28 NOTE — Evaluation (Addendum)
 Clinical/Bedside Swallow Evaluation Patient Details  Name: Ralphael Southgate MRN: [id number] Date of Birth: 07-26-1960  Today's Date: 08/28/2023 Time: SLP Start Time (ACUTE ONLY): 1540 SLP Stop Time (ACUTE ONLY): 1552 SLP Time Calculation (min) (ACUTE ONLY): 12 min  Past Medical History:  Past Medical History:  Diagnosis Date   Anxiety    Arthritis    Cardiomyopathy (HCC)    CHF (congestive heart failure) (HCC)    COPD (chronic obstructive pulmonary disease) (HCC)    Dysphagia    with esophageal stricture   Dysrhythmia    atrial fib   GERD (gastroesophageal reflux disease)    History of blood transfusion    Hypertension    Past Surgical History:  Past Surgical History:  Procedure Laterality Date   ACETABULAR REVISION Right 05/15/2012   Procedure: ACETABULAR REVISION;  Surgeon: Dempsey LULLA Moan, MD;  Location: [facility];  Service: Orthopedics;  Laterality: Right;  RIGHT HIP BEARING SURFACE VS ACETABULAR REVISION    ESOPHAGOGASTRODUODENOSCOPY     with dilitation   EYE SURGERY Right    cataract extraction with IOL   JOINT REPLACEMENT Bilateral    hips/revision right x 4   HPI:  Patient is a 63 y.o. male with PMH: former ETOH abuse, tobacco abuse (quit one week ago), paroxysmal a-fib, chronic HFpEF, nonischemic cardiomyopathy, HTN, HLD, COPD, esophageal dysphagia with h/o stricture and dysmotility s/p multiple dilations. He presented to the ED on 08/27/23 with one week off SOB and progressive worsening associated with productive cough. Upon EMS arrival, he was hypoxic with oxygen saturations 80% on RA. In ER he was tachycardic  and tachypneic. CXR showed new dense RLL airspace disease suspicious for PNA. He was started on dysphagia 3 (mechanical soft) solids,, thin liquids diet and SLP ordered to evaluation swallow function. He is currently on Air/contact precautions for TB r/o.   Assessment / Plan / Recommendation  Clinical Impression  Patient is not currently presenting with clinical s/s  of oropharyngeal dysphagia as per this bedside swallow evaluation. His medical history and symptoms all indicate a primary esophageal dysphagia. Patient reported that he has not been able to eat solid foods for past few weeks because his esophagus gets full and food comes back up. He indicated a h/o multiple esophageal dilations which do help and in fact he was scheduled for one on 09/10/23. SLP assessed his swallow as he took straw sips of thin liquids (water ). Swallow initiation appeared timely and no overt s/s aspiration. SLP is not recommending further skilled assessment but is recommending consideration of esophageal assessment, GI consult. SLP to s/o at this time. SLP Visit Diagnosis: Dysphagia, unspecified (R13.10)    Aspiration Risk       Diet Recommendation Regular;Thin liquid    Liquid Administration via: Cup;Straw Medication Administration: Whole meds with liquid Supervision: Patient able to self feed Compensations: Slow rate;Small sips/bites Postural Changes: Seated upright at 90 degrees;Remain upright for at least 30 minutes after po intake    Other  Recommendations Recommended Consults: Consider esophageal assessment;Consider GI evaluation     Assistance Recommended at Discharge    Functional Status Assessment Patient has not had a recent decline in their functional status  Frequency and Duration     N/A       Prognosis    N/A    Swallow Study   General Date of Onset: 08/27/23 HPI: Patient is a 63 y.o. male with PMH: former ETOH abuse, tobacco abuse (quit one week ago), paroxysmal a-fib, chronic HFpEF, nonischemic cardiomyopathy, HTN,  HLD, COPD, esophageal dysphagia with h/o stricture and dysmotility s/p multiple dilations. He presented to the ED on 08/27/23 with one week off SOB and progressive worsening associated with productive cough. Upon EMS arrival, he was hypoxic with oxygen saturations 80% on RA. In ER he was tachycardic  and tachypneic. CXR showed new dense RLL  airspace disease suspicious for PNA. He was started on dysphagia 3 (mechanical soft) solids,, thin liquids diet and SLP ordered to evaluation swallow function. Type of Study: Bedside Swallow Evaluation Previous Swallow Assessment: none found Diet Prior to this Study: Dysphagia 3 (mechanical soft);Thin liquids (Level 0) Temperature Spikes Noted: No Respiratory Status: Nasal cannula Behavior/Cognition: Alert;Cooperative;Pleasant mood Oral Cavity Assessment: Within Functional Limits Oral Care Completed by SLP: No Oral Cavity - Dentition: Adequate natural dentition Vision: Functional for self-feeding Self-Feeding Abilities: Able to feed self Patient Positioning: Upright in bed Baseline Vocal Quality: Normal Volitional Swallow: Able to elicit    Oral/Motor/Sensory Function Overall Oral Motor/Sensory Function: Within functional limits   Ice Chips     Thin Liquid Thin Liquid: Within functional limits Presentation: Straw;Self Fed    Nectar Thick     Honey Thick     Puree Puree: Not tested   Solid     Solid: Not tested      Norleen IVAR Blase, MA, CCC-SLP Speech Therapy

## 2023-08-28 NOTE — Progress Notes (Signed)
 Echocardiogram 2D Echocardiogram has been performed.  [name] 08/28/2023, 12:33 PM

## 2023-08-28 NOTE — Consult Note (Signed)
 NAME:  [name], MRN:  [id number], DOB:  [birth year], LOS: 1 ADMISSION DATE:  08/27/2023, CONSULTATION DATE:  7/15 REFERRING MD:  [facility], CHIEF COMPLAINT:  hypotension    History of Present Illness:  63yo male smoker with hx HTN, COPD, PAF on eliquis , NICM, HFpEF presented 7/14 with 1 week progressive SOB, cough, malaise, poor appetite. On EMS arrival with hypoxic with sats 80% on RA. In ER initially BP was soft with SBP 80's, tachy 130's but responded well to 1L fluids. CXR revealed dense RUL PNA. He was admitted by [facility], started on IV rocephin /azithro. On 7/15 he was still boarding in ER and again having hypotension with SBP 80's and PCCM was consulted.  Lactate 1.4 WBC 34  Pertinent  Medical History   has a past medical history of Anxiety, Arthritis, Cardiomyopathy (HCC), CHF (congestive heart failure) (HCC), COPD (chronic obstructive pulmonary disease) (HCC), Dysphagia, Dysrhythmia, GERD (gastroesophageal reflux disease), History of blood transfusion, and Hypertension.   Significant Hospital Events: Including procedures, antibiotic start and stop dates in addition to other pertinent events   CT chest 7/14>> Extensive airspace consolidation in the right upper lobe with lesser changes in the right middle and lower lobes consistent with multifocal infiltrate. Short-term follow-up following appropriate therapy is recommended to assess for resolution. Echo 7/15>>>  Interim History / Subjective:  Pt seen in ER. C/o 1 week progressive cough and SOB, overall malaise, chills. Has had some intermittent vomiting prior to this. States difficulty swallowing at times. Denies sick contacts.  Initial lactate 2.4->1.4  Objective    Blood pressure (!) 83/64, pulse 90, temperature (!) 97.2 F (36.2 C), temperature source Temporal, resp. rate (!) 29, height 5' 9 (1.753 m), SpO2 96%.        Intake/Output Summary (Last 24 hours) at 08/28/2023 1348 Last data filed at 08/27/2023 2230 Gross per 24 hour   Intake 2000 ml  Output --  Net 2000 ml   There were no vitals filed for this visit.  Examination: General: chronically ill appearing male, NAD in ER  HENT: mm moist, no JVD Lungs: resps even non labored on [place], diminished R, no audible wheeze  Cardiovascular: s1s2 rrr Abdomen: soft, non tender  Extremities: warm and dry, no sig edema  Neuro: awake, alert, appropriate, MAE   Resolved problem list   Assessment and Plan   Acute respiratory failure  RUL PNA - cap v aspiration  Hx COPD  Tobacco abuse  TB less likely - no known hx, no previous hx POS TB testing, no hx incarceration, homelessness, travel etc.  P:  Supplemental O2 as needed to keep sats >92% Sputum culture and sputum for AFB  Rocephin , azithro  Pulmonary hygiene  F/u CXR  Swallow eval  BD's  Smoking cessation - says he quit this week when he got sick  Hypotension  Sepsis r/t dense RUL PNA  P:  Volume resuscitation Recheck lactate, pct Abx as above  Pan culture   PAF  HFpEF Hx NICM Hx CAD P:  Continue home eliquis , amiodarone   Echo pending  Trend troponin  AKI on CKD 3 - baseline Scr ~1.4 (care everywhere)  Hypokalemia  P:  Volume as above  BP support  F/u chem  Replete K  ?dysphagia +/- aspiration with dense RUL PNA  Hx esophageal stricture P:  Speech eval  Aspiration precautions   Best Practice (right click and Reselect all SmartList Selections daily)   Diet/type: NPO DVT prophylaxis DOAC Pressure ulcer(s): N/A GI prophylaxis: N/A Lines: N/A  Foley:  N/A Code Status:  full code Last date of multidisciplinary goals of care discussion [7/15 pt and sister updated at bedside in ER with bedside RN and Dr. Brown  Labs   CBC: Recent Labs  Lab 08/27/23 1518 08/27/23 1551 08/28/23 0605  WBC 35.2*  --  32.9*  NEUTROABS 31.3*  --   --   HGB 13.6 13.9 13.1  HCT 41.3 41.0 40.3  MCV 80.8  --  81.6  PLT 570*  --  507*    Basic Metabolic Panel: Recent Labs  Lab  08/27/23 1518 08/27/23 1537 08/27/23 1551 08/28/23 0605  NA 135  --  139 138  K 3.7  --  3.3* 4.0  CL 95*  --   --  100  CO2 22  --   --  24  GLUCOSE 101*  --   --  134*  BUN 63*  --   --  75*  CREATININE 1.95*  --   --  1.80*  CALCIUM  7.9*  --   --  8.1*  MG  --  2.9*  --  2.9*  PHOS  --   --   --  6.0*   GFR: CrCl cannot be calculated (Unknown ideal weight.). Recent Labs  Lab 08/27/23 1518 08/27/23 1552 08/27/23 2002 08/28/23 0605  PROCALCITON  --   --   --  3.28  WBC 35.2*  --   --  32.9*  LATICACIDVEN  --  2.4* 2.4* 1.4    Liver Function Tests: Recent Labs  Lab 08/27/23 1518 08/28/23 0605  AST 60* 57*  ALT 32 31  ALKPHOS 110 103  BILITOT 1.6* 1.3*  PROT 6.7 6.2*  ALBUMIN  1.8* 1.6*   No results for input(s): LIPASE, AMYLASE in the last 168 hours. No results for input(s): AMMONIA in the last 168 hours.  ABG    Component Value Date/Time   HCO3 26.4 08/27/2023 1551   TCO2 28 08/27/2023 1551   O2SAT 76 08/27/2023 1551     Coagulation Profile: Recent Labs  Lab 08/27/23 1518  INR 1.5*    Cardiac Enzymes: No results for input(s): CKTOTAL, CKMB, CKMBINDEX, TROPONINI in the last 168 hours.  HbA1C: No results found for: HGBA1C  CBG: No results for input(s): GLUCAP in the last 168 hours.  Review of Systems:   As per HPI - All other systems reviewed and were neg.    Past Medical History:  He,  has a past medical history of Anxiety, Arthritis, Cardiomyopathy (HCC), CHF (congestive heart failure) (HCC), COPD (chronic obstructive pulmonary disease) (HCC), Dysphagia, Dysrhythmia, GERD (gastroesophageal reflux disease), History of blood transfusion, and Hypertension.   Surgical History:   Past Surgical History:  Procedure Laterality Date   ACETABULAR REVISION Right 05/15/2012   Procedure: ACETABULAR REVISION;  Surgeon: Dempsey LULLA Moan, MD;  Location: [facility];  Service: Orthopedics;  Laterality: Right;  RIGHT HIP BEARING SURFACE VS  ACETABULAR REVISION    ESOPHAGOGASTRODUODENOSCOPY     with dilitation   EYE SURGERY Right    cataract extraction with IOL   JOINT REPLACEMENT Bilateral    hips/revision right x 4     Social History:   reports that he has been smoking cigarettes. He has a 32 pack-year smoking history. He has never used smokeless tobacco. He reports that he does not drink alcohol and does not use drugs.   Family History:  His family history includes Diabetes in his mother; Stroke in his father.   Allergies Allergies  Allergen  Reactions   Codeine Nausea And Vomiting     Home Medications  Prior to Admission medications   Medication Sig Start Date End Date Taking? Authorizing Provider  albuterol  (PROVENTIL ) (2.5 MG/3ML) 0.083% nebulizer solution TAKE 1 INHALATION MILLILITER 4 TIMES A DAY 05/20/18   [provider]  amiodarone  (PACERONE ) 200 MG tablet Take by mouth. 08/02/16   [provider]  apixaban  (ELIQUIS ) 5 MG TABS tablet Take by mouth. 09/04/17   [provider]  aspirin  81 MG chewable tablet Chew by mouth. 05/03/16   [provider]  atorvastatin  (LIPITOR) 80 MG tablet Take by mouth. 10/09/16 09/04/18  [provider]  carvedilol  (COREG ) 6.25 MG tablet Take 6.25 mg by mouth 2 (two) times daily.    [provider]  diltiazem (CARDIZEM CD) 120 MG 24 hr capsule Take 120 mg by mouth daily.    [provider]  esomeprazole (NEXIUM) 40 MG capsule Take 40 mg by mouth every morning. 06/04/23   [provider]  fluticasone  (FLONASE ) 50 MCG/ACT nasal spray Place 2 sprays into the nose daily as needed for allergies.    [provider]  furosemide  (LASIX ) 20 MG tablet Take 20 mg by mouth daily.    [provider]  lisinopril (ZESTRIL) 10 MG tablet Take 10 mg by mouth daily.    [provider]  magnesium oxide (MAG-OX) 400 MG tablet Take by mouth. 05/01/18   [provider]  oxyCODONE -acetaminophen  (PERCOCET)  10-325 MG tablet as needed. 05/10/17   [provider]  potassium chloride  (KLOR-CON ) 10 MEQ tablet Take 10 mEq by mouth daily.    [provider]  spironolactone  (ALDACTONE ) 25 MG tablet Take 25 mg by mouth every morning.    [provider]  traMADol  (ULTRAM ) 50 MG tablet Take 1-2 tablets (50-100 mg total) by mouth every 6 (six) hours as needed (mild pain). 05/17/12   Lanell, Alexzandrew L, PA-C    Rockie Myers, NP Pulmonary/Critical Care Medicine  08/28/2023  1:48 PM   See Tracey for personal pager PCCM on call pager [phone] until 7pm. Please call Elink 7p-7a. [phone]

## 2023-08-28 NOTE — Progress Notes (Addendum)
 eLink Physician-Brief Progress Note Patient Name: [name] DOB: 1960-07-01 MRN: [id number]   Date of Service  08/28/2023  HPI/Events of Note  63 year old with history of hypertension, COPD, paroxysmal atrial fibrillation, nonischemic cardiomyopathy, HFpEF presenting with dyspnea, acute hypoxic respiratory failure, right lower lobe pulmonary infiltrate.  Currently on 6 L.  Has acute kidney injury.    Patient is complaining of 10 out of 10 pain, chronic back/hip pain status post multiple surgeries.  Takes Percocet and tramadol  at home.  Also requesting anxiolytic, took Valium  in the past.  eICU Interventions  Add home Percocet Hold on anxiolytic for the time being, attempt to avoid polypharmacy in the setting of concurrent respiratory failure and AKI     Intervention Category Intermediate Interventions: Pain - evaluation and management  [name] 08/28/2023, 8:50 PM

## 2023-08-28 NOTE — Progress Notes (Signed)
 Progress Note   Patient: [name] FMW:[id number] DOB: 07/01/1960 DOA: 08/27/2023     1 DOS: the patient was seen and examined on 08/28/2023   Brief hospital course:  Safir Michalec is a 63 y.o. male with medical history significant for former alcohol abuse, tobacco abuse (quit a week ago), paroxysmal A-fib on Eliquis  and amiodarone , chronic HFpEF, nonischemic cardiomyopathy, hypertension, hyperlipidemia, COPD, history of esophageal stricture and dysmotility, who presents to the ER due to 1 week of shortness of breath and progressively worsening.   In the ED patient was tachycardic on tachypneic and low blood pressures, started on sepsis protocol chest x-ray showed right upper lobe airspace disease admitted to [facility] service for sepsis secondary to pneumonia rule out TB, acute hypoxic respiratory failure.  Assessment and Plan: Severe sepsis secondary to right upper lobe pneumonia Patient has tachycardia, tachypnea, low blood pressures, lactic acidosis, elevated white count 35K. Continue supplemental oxygen to maintain saturation greater than 92%. Continue IV fluid resuscitation as per sepsis protocol. Patient is on TB isolation, 3 AFB samples to be sent. Continue Rocephin , azithromycin . Continue to maintain MAP greater than 60. Critical care team consulted for further management.  Acute hypoxic respiratory failure- Secondary to right upper lobe pneumonia. Continue supplemental oxygen to maintain saturation 92%. He is on 3L supplemental oxygen Continue bronchodilators as needed. Encourage incentive spirometry, out of bed to chair.  Elevated troponin HFpEF Suspicion for demand ischemia in the setting of respiratory failure. Troponins remain flat. 2D echo pending. Caution with fluid overload. Hold Lasix , lisinopril, Aldactone , Coreg , diltiazem due to low BP.  Hypokalemia: Improved with oral repletion.  Acute on CKD stage III A: From labs care everywhere creatinine baseline  around 1.3-1.5. Creatinine on presentation 1.95. improving with IV fluids. Continue to monitor daily renal function, electrolytes. Avoid nephrotoxic drugs.  A-fib with RVR- Continue home dose amiodarone , Eliquis  therapy. Echo pending.  Dysphagia He is at high risk for aspiration History of esophageal stricture in the past SLP evaluation called. Dysphagia 3 diet for now. Continue aspiration precautions.     Out of bed to chair. Incentive spirometry. Nursing supportive care. Fall, aspiration precautions. Diet:  Diet Orders (From admission, onward)     Start     Ordered   08/27/23 2336  DIET DYS 3 Room service appropriate? Yes; Fluid consistency: Thin  Diet effective now       Question Answer Comment  Room service appropriate? Yes   Fluid consistency: Thin      08/27/23 2336           DVT prophylaxis:  apixaban  (ELIQUIS ) tablet 5 mg  Level of care: ICU   Code Status: Full Code  Subjective: Patient is seen and examined today morning.  He is very weak, has cough, currently requiring 3 L supplemental oxygen.  Patient states that he feels better than yesterday, breathing better. States appetite better, did not eat for 1 week per him. Blood pressure lower side, tachycardia persist.  Physical Exam: Vitals:   08/28/23 1310 08/28/23 1315 08/28/23 1320 08/28/23 1330  BP: (!) 87/67 (!) 85/71 (!) 85/68 (!) 83/64  Pulse: (!) 107 (!) 101 (!) 155 90  Resp: (!) 29 (!) 27 (!) 28 (!) 29  Temp:      TempSrc:      SpO2: 96% 95% 95% 96%  Height:        General - Elderly Caucasian looking male, moderate respiratory distress HEENT - PERRLA, EOMI, atraumatic head, non tender sinuses. Lung - rales, rhonchi over  right side, using accessory muscles. Heart - S1, S2 heard, no murmurs, rubs, trace pedal edema. Abdomen - Soft, non tender, bowel sounds good Neuro - Alert, awake and oriented x 3, non focal exam. Skin - Warm and dry.  Data Reviewed:      Latest Ref Rng & Units  08/28/2023    6:05 AM 08/27/2023    3:51 PM 08/27/2023    3:18 PM  CBC  WBC 4.0 - 10.5 K/uL 32.9   35.2   Hemoglobin 13.0 - 17.0 g/dL 86.8  86.0  86.3   Hematocrit 39.0 - 52.0 % 40.3  41.0  41.3   Platelets 150 - 400 K/uL 507   570       Latest Ref Rng & Units 08/28/2023    6:05 AM 08/27/2023    3:51 PM 08/27/2023    3:18 PM  BMP  Glucose 70 - 99 mg/dL 865   898   BUN 8 - 23 mg/dL 75   63   Creatinine 9.38 - 1.24 mg/dL 8.19   8.04   Sodium 864 - 145 mmol/L 138  139  135   Potassium 3.5 - 5.1 mmol/L 4.0  3.3  3.7   Chloride 98 - 111 mmol/L 100   95   CO2 22 - 32 mmol/L 24   22   Calcium  8.9 - 10.3 mg/dL 8.1   7.9    CT CHEST WO CONTRAST Result Date: 08/27/2023 CLINICAL DATA:  Increased right apical airspace opacity EXAM: CT CHEST WITHOUT CONTRAST TECHNIQUE: Multidetector CT imaging of the chest was performed following the standard protocol without IV contrast. RADIATION DOSE REDUCTION: This exam was performed according to the departmental dose-optimization program which includes automated exposure control, adjustment of the mA and/or kV according to patient size and/or use of iterative reconstruction technique. COMPARISON:  Chest x-ray from earlier in the same day. FINDINGS: Cardiovascular: Atherosclerotic calcifications of the thoracic aorta are noted. Absence of contrast material somewhat limits the vascular evaluation. Heart is not significantly enlarged in size. Heavy coronary calcifications are noted. Mediastinum/Nodes: Thoracic inlet is within normal limits. No hilar or mediastinal adenopathy is noted. At least two tablets are noted within the mid to distal esophagus. Lungs/Pleura: Significant emphysematous changes are noted. Left lung is well aerated. Right lung is well aerated but demonstrate significant airspace consolidation. Patchy airspace opacity is noted in the right middle and right lower lobes medially. These changes are felt to represent extensive pneumonia. The possibility of a  neoplasm however cannot be totally excluded. No sizable effusion is seen. Upper Abdomen: No acute abnormality. Musculoskeletal: No chest wall mass or suspicious bone lesions identified. IMPRESSION: Extensive airspace consolidation in the right upper lobe with lesser changes in the right middle and lower lobes consistent with multifocal infiltrate. Short-term follow-up following appropriate therapy is recommended to assess for resolution. Aortic Atherosclerosis (ICD10-I70.0) and Emphysema (ICD10-J43.9). Electronically Signed   By: Oneil Devonshire M.D.   On: 08/27/2023 23:07   DG Chest Port 1 View Result Date: 08/27/2023 CLINICAL DATA:  Shortness of breath for 2-3 days. History of congestive heart failure and COPD. EXAM: PORTABLE CHEST 1 VIEW COMPARISON:  Radiographs 04/04/2018 and 11/11/2015. Chest CT 09/07/2022. FINDINGS: 1524 hours. Two AP semi erect views of the chest are submitted. There is new dense right upper lobe airspace disease with associated volume loss. The left lung appears clear. No evidence of pleural effusion or pneumothorax. The visualized heart size and mediastinal contours are stable with aortic atherosclerosis. No acute osseous  findings are evident. Telemetry leads overlie the chest. IMPRESSION: New dense right upper lobe airspace disease with associated volume loss, suspicious for pneumonia. Differential includes reactivation tuberculosis and malignancy. Correlate clinically. Followup PA and lateral chest X-ray is recommended in 4-6 weeks following appropriate therapy to ensure resolution and exclude underlying malignancy. Electronically Signed   By: Elsie Perone M.D.   On: 08/27/2023 15:37    Family Communication: Discussed with patient, he understand and agree. All questions answered.  Disposition: Status is: Inpatient Remains inpatient appropriate because: severity of illness  Planned Discharge Destination: lives alone, will need PT eval prior to dc.     Time spent: 54  minutes  Author: Concepcion Riser, MD 08/28/2023 1:54 PM Secure chat 7am to 7pm For on call review [email or web address].

## 2023-08-28 NOTE — ED Notes (Signed)
 Pt was stuck 3 times for a second IV & no access was successful.

## 2023-08-29 ENCOUNTER — Inpatient Hospital Stay (HOSPITAL_COMMUNITY)

## 2023-08-29 DIAGNOSIS — A419 Sepsis, unspecified organism: Secondary | ICD-10-CM | POA: Diagnosis not present

## 2023-08-29 DIAGNOSIS — J9601 Acute respiratory failure with hypoxia: Secondary | ICD-10-CM | POA: Diagnosis not present

## 2023-08-29 DIAGNOSIS — R6521 Severe sepsis with septic shock: Secondary | ICD-10-CM | POA: Diagnosis not present

## 2023-08-29 DIAGNOSIS — J189 Pneumonia, unspecified organism: Secondary | ICD-10-CM | POA: Diagnosis not present

## 2023-08-29 LAB — RENAL FUNCTION PANEL
Albumin: 1.5 g/dL — ABNORMAL LOW (ref 3.5–5.0)
Anion gap: 15 (ref 5–15)
BUN: 73 mg/dL — ABNORMAL HIGH (ref 8–23)
CO2: 22 mmol/L (ref 22–32)
Calcium: 7.9 mg/dL — ABNORMAL LOW (ref 8.9–10.3)
Chloride: 100 mmol/L (ref 98–111)
Creatinine, Ser: 1.41 mg/dL — ABNORMAL HIGH (ref 0.61–1.24)
GFR, Estimated: 56 mL/min — ABNORMAL LOW (ref >60)
Glucose, Bld: 142 mg/dL — ABNORMAL HIGH (ref 70–99)
Phosphorus: 4.8 mg/dL — ABNORMAL HIGH (ref 2.5–4.6)
Potassium: 3.7 mmol/L (ref 3.5–5.1)
Sodium: 137 mmol/L (ref 135–145)

## 2023-08-29 LAB — LEGIONELLA PNEUMOPHILA SEROGP 1 UR AG: L. pneumophila Serogp 1 Ur Ag: NEGATIVE

## 2023-08-29 LAB — CBC
HCT: 35.1 % — ABNORMAL LOW (ref 39.0–52.0)
Hemoglobin: 11.6 g/dL — ABNORMAL LOW (ref 13.0–17.0)
MCH: 26.5 pg (ref 26.0–34.0)
MCHC: 33 g/dL (ref 30.0–36.0)
MCV: 80.1 fL (ref 80.0–100.0)
Platelets: 498 K/uL — ABNORMAL HIGH (ref 150–400)
RBC: 4.38 MIL/uL (ref 4.22–5.81)
RDW: 16.6 % — ABNORMAL HIGH (ref 11.5–15.5)
WBC: 41.1 K/uL — ABNORMAL HIGH (ref 4.0–10.5)
nRBC: 0 % (ref 0.0–0.2)

## 2023-08-29 LAB — PROCALCITONIN: Procalcitonin: 1.84 ng/mL

## 2023-08-29 MED ORDER — ATORVASTATIN CALCIUM 40 MG PO TABS
40.0000 mg | ORAL_TABLET | Freq: Every day | ORAL | Status: DC
  Administered 2023-08-29 – 2023-09-06 (×9): 40 mg via ORAL
  Filled 2023-08-29 (×9): qty 1

## 2023-08-29 MED ORDER — DIAZEPAM 2 MG PO TABS
5.0000 mg | ORAL_TABLET | Freq: Four times a day (QID) | ORAL | Status: DC | PRN
  Administered 2023-08-29 – 2023-09-07 (×25): 5 mg via ORAL
  Filled 2023-08-29 (×25): qty 1

## 2023-08-29 MED ORDER — REVEFENACIN 175 MCG/3ML IN SOLN
175.0000 ug | Freq: Every day | RESPIRATORY_TRACT | Status: DC
  Administered 2023-08-30 – 2023-09-26 (×30): 175 ug via RESPIRATORY_TRACT
  Filled 2023-08-29 (×28): qty 3

## 2023-08-29 MED ORDER — FLUTICASONE PROPIONATE 50 MCG/ACT NA SUSP
2.0000 | Freq: Every day | NASAL | Status: DC | PRN
  Filled 2023-08-29: qty 16

## 2023-08-29 MED ORDER — PIPERACILLIN-TAZOBACTAM 3.375 G IVPB
3.3750 g | Freq: Three times a day (TID) | INTRAVENOUS | Status: AC
  Administered 2023-08-29 – 2023-09-04 (×20): 3.375 g via INTRAVENOUS
  Filled 2023-08-29 (×19): qty 50

## 2023-08-29 MED ORDER — NICOTINE 21 MG/24HR TD PT24
21.0000 mg | MEDICATED_PATCH | Freq: Every day | TRANSDERMAL | Status: DC
  Filled 2023-08-29 (×9): qty 1

## 2023-08-29 MED ORDER — SODIUM CHLORIDE 0.9 % IV SOLN
100.0000 mg | Freq: Two times a day (BID) | INTRAVENOUS | Status: DC
  Administered 2023-08-29 – 2023-08-31 (×5): 100 mg via INTRAVENOUS
  Filled 2023-08-29 (×5): qty 100

## 2023-08-29 MED ORDER — ALBUMIN HUMAN 25 % IV SOLN
25.0000 g | Freq: Four times a day (QID) | INTRAVENOUS | Status: AC
  Administered 2023-08-29 – 2023-08-30 (×4): 25 g via INTRAVENOUS
  Filled 2023-08-29 (×4): qty 100

## 2023-08-29 MED ORDER — ENSURE PLUS HIGH PROTEIN PO LIQD
237.0000 mL | Freq: Two times a day (BID) | ORAL | Status: DC
  Administered 2023-08-31 – 2023-09-04 (×10): 237 mL via ORAL

## 2023-08-29 MED ORDER — PANTOPRAZOLE SODIUM 40 MG PO TBEC
80.0000 mg | DELAYED_RELEASE_TABLET | Freq: Every day | ORAL | Status: DC
  Administered 2023-08-29 – 2023-09-06 (×9): 80 mg via ORAL
  Filled 2023-08-29 (×9): qty 2

## 2023-08-29 MED ORDER — ARFORMOTEROL TARTRATE 15 MCG/2ML IN NEBU
15.0000 ug | INHALATION_SOLUTION | Freq: Two times a day (BID) | RESPIRATORY_TRACT | Status: DC
  Administered 2023-08-29 – 2023-09-26 (×60): 15 ug via RESPIRATORY_TRACT
  Filled 2023-08-29 (×56): qty 2

## 2023-08-29 NOTE — Plan of Care (Signed)
  Problem: Clinical Measurements: Goal: Ability to maintain clinical measurements within normal limits will improve Outcome: Progressing Goal: Will remain free from infection Outcome: Progressing Goal: Diagnostic test results will improve Outcome: Progressing Goal: Respiratory complications will improve Outcome: Progressing Goal: Cardiovascular complication will be avoided Outcome: Progressing   Problem: Activity: Goal: Risk for activity intolerance will decrease Outcome: Progressing   Problem: Nutrition: Goal: Adequate nutrition will be maintained Outcome: Progressing   Problem: Coping: Goal: Level of anxiety will decrease Outcome: Progressing   Problem: Elimination: Goal: Will not experience complications related to bowel motility Outcome: Progressing Goal: Will not experience complications related to urinary retention Outcome: Progressing   Problem: Pain Managment: Goal: General experience of comfort will improve and/or be controlled Outcome: Progressing

## 2023-08-29 NOTE — Progress Notes (Signed)
   NAME:  [name], MRN:  [id number], DOB:  [birth year], LOS: 2 ADMISSION DATE:  08/27/2023, CONSULTATION DATE:  7/15 REFERRING MD:  TR[facility], CHIEF COMPLAINT:  hypotension    History of Present Illness:  63yo male smoker with hx HTN, COPD, PAF on eliquis , NICM, HFpEF presented 7/14 with 1 week progressive SOB, cough, malaise, poor appetite. On EMS arrival with hypoxic with sats 80% on RA. In ER initially BP was soft with SBP 80's, tachy 130's but responded well to 1L fluids. CXR revealed dense RUL PNA. He was admitted by [facility], started on IV rocephin /azithro. On 7/15 he was still boarding in ER and again having hypotension with SBP 80's and PCCM was consulted.  Lactate 1.4 WBC 34  Pertinent  Medical History   has a past medical history of Anxiety, Arthritis, Cardiomyopathy (HCC), CHF (congestive heart failure) (HCC), COPD (chronic obstructive pulmonary disease) (HCC), Dysphagia, Dysrhythmia, GERD (gastroesophageal reflux disease), History of blood transfusion, and Hypertension.   Significant Hospital Events: Including procedures, antibiotic start and stop dates in addition to other pertinent events   CT chest 7/14>> Extensive airspace consolidation in the right upper lobe with lesser changes in the right middle and lower lobes consistent with multifocal infiltrate. Short-term follow-up following appropriate therapy is recommended to assess for resolution. Echo 7/15>>>  Interim History / Subjective:  Feels a bit better. On levophed  but getting better.  Objective    Blood pressure 111/85, pulse 92, temperature (!) 96.1 F (35.6 C), resp. rate 20, height 5' 9 (1.753 m), weight 77.8 kg, SpO2 96%.        Intake/Output Summary (Last 24 hours) at 08/29/2023 0912 Last data filed at 08/29/2023 0600 Gross per 24 hour  Intake 6518.3 ml  Output 850 ml  Net 5668.3 ml   Filed Weights   08/28/23 1716  Weight: 77.8 kg    Examination: No distress RASS 0 Coughing up green phlegm Rhonci  bilaterally, comfortable breathing pattern Moves to command Poor dentition  CXR stable dense RUL infiltrate WBC up but did get large dose of methylpred yesterday Renal function better  Resolved problem list   Assessment and Plan   Acute respiratory failure  RUL PNA - cap v aspiration  Hx COPD, + emphysema on imaging Tobacco abuse  Septic shock PAF  HFpEF Hx NICM Hx CAD AKI on CKD 3  ?dysphagia +/- aspiration with dense RUL PNA  Hx esophageal stricture  Overall improving with fluids. Not c/w TB, Dc'ing this Abx to zosyn /doxycycline  Once more recovered will do esophagram as probable aspiration leading to present LABA/LAMA nebs, no role for steroids at present Giving some albumin , encourage PO, wean off levophed  Getting LR x 1 day 3.6L Will need f/u imaging and bronch if nonresolving given cancer risk Pain/anxiety control as ordered Nicotine  patch  Best Practice (right click and Reselect all SmartList Selections daily)   Diet/type: dysphagia DVT prophylaxis DOAC Pressure ulcer(s): N/A GI prophylaxis: N/A Lines: N/A Foley:  N/A Code Status:  full code Last date of multidisciplinary goals of care discussion [patient updated]  31 min cc time Rolan Sharps MD PCCM

## 2023-08-29 NOTE — Progress Notes (Signed)
 08/29/2023 CT, symptoms, clinical history are not suspicious for TB. He has cavitary RUL PNA in setting of dysphagia, alcohol abuse, and underlying ephysema; if persistent would need bronch r/o BAC Do not feel need to rule it out.  Rolan Sharps MD PCCM

## 2023-08-30 ENCOUNTER — Inpatient Hospital Stay (HOSPITAL_COMMUNITY)

## 2023-08-30 DIAGNOSIS — A419 Sepsis, unspecified organism: Secondary | ICD-10-CM | POA: Diagnosis not present

## 2023-08-30 DIAGNOSIS — R6521 Severe sepsis with septic shock: Secondary | ICD-10-CM | POA: Diagnosis not present

## 2023-08-30 DIAGNOSIS — J189 Pneumonia, unspecified organism: Secondary | ICD-10-CM | POA: Diagnosis not present

## 2023-08-30 DIAGNOSIS — J9601 Acute respiratory failure with hypoxia: Secondary | ICD-10-CM | POA: Diagnosis not present

## 2023-08-30 DIAGNOSIS — R609 Edema, unspecified: Secondary | ICD-10-CM | POA: Diagnosis not present

## 2023-08-30 LAB — BASIC METABOLIC PANEL WITH GFR
Anion gap: 9 (ref 5–15)
BUN: 53 mg/dL — ABNORMAL HIGH (ref 8–23)
CO2: 28 mmol/L (ref 22–32)
Calcium: 8.3 mg/dL — ABNORMAL LOW (ref 8.9–10.3)
Chloride: 107 mmol/L (ref 98–111)
Creatinine, Ser: 0.89 mg/dL (ref 0.61–1.24)
GFR, Estimated: >60 mL/min (ref >60)
Glucose, Bld: 120 mg/dL — ABNORMAL HIGH (ref 70–99)
Potassium: 3.8 mmol/L (ref 3.5–5.1)
Sodium: 144 mmol/L (ref 135–145)

## 2023-08-30 LAB — CK: Total CK: 118 U/L (ref 49–397)

## 2023-08-30 LAB — BRAIN NATRIURETIC PEPTIDE: B Natriuretic Peptide: 512.6 pg/mL — ABNORMAL HIGH (ref 0.0–100.0)

## 2023-08-30 LAB — CBC
HCT: 32.4 % — ABNORMAL LOW (ref 39.0–52.0)
Hemoglobin: 10.3 g/dL — ABNORMAL LOW (ref 13.0–17.0)
MCH: 26.4 pg (ref 26.0–34.0)
MCHC: 31.8 g/dL (ref 30.0–36.0)
MCV: 83.1 fL (ref 80.0–100.0)
Platelets: 359 K/uL (ref 150–400)
RBC: 3.9 MIL/uL — ABNORMAL LOW (ref 4.22–5.81)
RDW: 17.2 % — ABNORMAL HIGH (ref 11.5–15.5)
WBC: 24.7 K/uL — ABNORMAL HIGH (ref 4.0–10.5)
nRBC: 0 % (ref 0.0–0.2)

## 2023-08-30 LAB — C-REACTIVE PROTEIN: CRP: 9.5 mg/dL — ABNORMAL HIGH (ref <1.0)

## 2023-08-30 LAB — ACID FAST SMEAR (AFB, MYCOBACTERIA): Acid Fast Smear: NEGATIVE

## 2023-08-30 LAB — FERRITIN: Ferritin: 222 ng/mL (ref 24–336)

## 2023-08-30 LAB — PROCALCITONIN: Procalcitonin: 1.15 ng/mL

## 2023-08-30 LAB — SEDIMENTATION RATE: Sed Rate: 21 mm/h — ABNORMAL HIGH (ref 0–16)

## 2023-08-30 MED ORDER — DEXMEDETOMIDINE HCL IN NACL 400 MCG/100ML IV SOLN
0.0000 ug/kg/h | INTRAVENOUS | Status: DC
  Administered 2023-08-30: 0.4 ug/kg/h via INTRAVENOUS
  Administered 2023-08-30 – 2023-08-31 (×2): 0.3 ug/kg/h via INTRAVENOUS
  Administered 2023-09-01: 0.2 ug/kg/h via INTRAVENOUS
  Filled 2023-08-30 (×4): qty 100

## 2023-08-30 MED ORDER — ALBUMIN HUMAN 25 % IV SOLN
25.0000 g | Freq: Four times a day (QID) | INTRAVENOUS | Status: AC
  Administered 2023-08-30 – 2023-08-31 (×4): 25 g via INTRAVENOUS
  Filled 2023-08-30 (×4): qty 100

## 2023-08-30 MED ORDER — POTASSIUM CHLORIDE 10 MEQ/100ML IV SOLN: 10.0000 meq | INTRAVENOUS | Status: DC

## 2023-08-30 MED ORDER — SODIUM CHLORIDE 0.9% FLUSH
10.0000 mL | Freq: Two times a day (BID) | INTRAVENOUS | Status: DC
  Administered 2023-08-30 – 2023-09-06 (×11): 10 mL
  Administered 2023-09-06: 20 mL
  Administered 2023-09-07 – 2023-09-09 (×4): 10 mL
  Administered 2023-09-10: 20 mL
  Administered 2023-09-10 – 2023-09-21 (×23): 10 mL
  Administered 2023-09-22: 30 mL
  Administered 2023-09-22 – 2023-09-26 (×13): 10 mL

## 2023-08-30 MED ORDER — FUROSEMIDE 10 MG/ML IJ SOLN
60.0000 mg | Freq: Four times a day (QID) | INTRAMUSCULAR | Status: AC
  Administered 2023-08-30 (×2): 60 mg via INTRAVENOUS
  Filled 2023-08-30 (×2): qty 6

## 2023-08-30 MED ORDER — POTASSIUM CHLORIDE CRYS ER 20 MEQ PO TBCR: 40.0000 meq | EXTENDED_RELEASE_TABLET | Freq: Once | ORAL | Status: DC

## 2023-08-30 MED ORDER — METHYLPREDNISOLONE SODIUM SUCC 125 MG IJ SOLR
80.0000 mg | INTRAMUSCULAR | Status: DC
  Administered 2023-08-30 – 2023-08-31 (×2): 80 mg via INTRAVENOUS
  Filled 2023-08-30 (×3): qty 2

## 2023-08-30 MED ORDER — THIAMINE HCL 100 MG/ML IJ SOLN
100.0000 mg | Freq: Every day | INTRAMUSCULAR | Status: DC
  Administered 2023-08-30 – 2023-09-02 (×4): 100 mg via INTRAVENOUS
  Filled 2023-08-30 (×5): qty 2

## 2023-08-30 MED ORDER — POTASSIUM CHLORIDE 20 MEQ PO PACK
40.0000 meq | PACK | Freq: Once | ORAL | Status: AC
  Administered 2023-08-30: 40 meq via ORAL
  Filled 2023-08-30: qty 2

## 2023-08-30 NOTE — Progress Notes (Signed)
   NAME:  [name], MRN:  [id number], DOB:  04-Sep-1960, LOS: 3 ADMISSION DATE:  08/27/2023, CONSULTATION DATE:  7/15 REFERRING MD:  TRH, CHIEF COMPLAINT:  hypotension    History of Present Illness:  63yo male smoker with hx HTN, COPD, PAF on eliquis , NICM, HFpEF presented 7/14 with 1 week progressive SOB, cough, malaise, poor appetite. On EMS arrival with hypoxic with sats 80% on RA. In ER initially BP was soft with SBP 80's, tachy 130's but responded well to 1L fluids. CXR revealed dense RUL PNA. He was admitted by TRH, started on IV rocephin /azithro. On 7/15 he was still boarding in ER and again having hypotension with SBP 80's and PCCM was consulted.  Lactate 1.4 WBC 34  Pertinent  Medical History   has a past medical history of Anxiety, Arthritis, Cardiomyopathy (HCC), CHF (congestive heart failure) (HCC), COPD (chronic obstructive pulmonary disease) (HCC), Dysphagia, Dysrhythmia, GERD (gastroesophageal reflux disease), History of blood transfusion, and Hypertension.   Significant Hospital Events: Including procedures, antibiotic start and stop dates in addition to other pertinent events   CT chest 7/14>> Extensive airspace consolidation in the right upper lobe with lesser changes in the right middle and lower lobes consistent with multifocal infiltrate. Short-term follow-up following appropriate therapy is recommended to assess for resolution. Echo 7/15>>>  Interim History / Subjective:  Worsening resp status this am, feels like he cannot catch breath, anxious as well. Denies pain  Objective    Blood pressure (!) 148/98, pulse (!) 104, temperature 97.6 F (36.4 C), temperature source Oral, resp. rate (!) 26, height 5' 9 (1.753 m), weight 80.8 kg, SpO2 95%.    FiO2 (%):  [75 %] 75 %   Intake/Output Summary (Last 24 hours) at 08/30/2023 0853 Last data filed at 08/30/2023 9180 Gross per 24 hour  Intake 2271.93 ml  Output 1975 ml  Net 296.93 ml   Filed Weights   08/28/23  1716 08/30/23 0255  Weight: 77.8 kg 80.8 kg    Examination: Appears more uncomfortable this am with accesory muscle use Severely reduced breath sounds Trace edema Heart tachy, good pulses  Labs pending CXR x 2 reviewed: dense RUL PNA query developing interstitial edema  Resolved problem list   Assessment and Plan   Acute respiratory failure  RUL PNA - cap v aspiration: was getting better yesterday now worse; cultures still pending; query volume overload post sepsis rescuscitation Hx COPD, + emphysema on imaging in flare Tobacco abuse  Septic shock PAF  HFpEF Hx NICM Hx CAD AKI on CKD 3  ?dysphagia +/- aspiration with dense RUL PNA  Hx esophageal stricture  Lasix  Doxy/zosyn , f/u culture data, check rheum panel Steroids, nebs HHFNC + precedex ; denies any recent drinking so think this anxiety is really air hunger, sats 92% on 15LPM Keep in ICU, high risk to progress to intubation Check LE duplex but suspect low yield in context of PTA AC  Best Practice (right click and Reselect all SmartList Selections daily)   Diet/type: dysphagia DVT prophylaxis eliquis  Pressure ulcer(s): N/A GI prophylaxis: N/A Lines: N/A Foley:  N/A Code Status:  full code Last date of multidisciplinary goals of care discussion [patient updated]  35 min cc time Rolan Sharps MD PCCM

## 2023-08-30 NOTE — Progress Notes (Signed)
 BLE venous duplex has been completed.   Results can be found under chart review under CV PROC. 08/30/2023 3:42 PM Lars Jeziorski RVT, RDMS

## 2023-08-30 NOTE — TOC Initial Note (Addendum)
 Transition of Care [facility]) - Initial/Assessment Note    Patient Details  Name: [name] MRN: [id number] Date of Birth: 05-Dec-1960  Transition [facility]) CM/SW Contact:    Sudie Erminio Deems, RN Phone Number: 08/30/2023, 1:27 PM  Clinical Narrative: Patient presented for shortness of breath and hypotension. PTA patient was from home alone. Sister and brother-in-law at the bedside during the visit. Patient has DME cane and rolling walker in the home. Patient reports that he still drives; however, sister feels that the patient is unsafe at home alone. Patient will benefit from PT/OT consult as he progresses. Case Manager will continue to follow for additional transition of care needs as the patient progresses.           Expected Discharge Plan: Skilled Nursing Facility Barriers to Discharge: Continued Medical Work up   Patient Goals and CMS Choice            Expected Discharge Plan and Services In-house Referral: NA Discharge Planning Services: CM Consult   Living arrangements for the past 2 months: Mobile Home                   DME Agency: NA                  Prior Living Arrangements/Services Living arrangements for the past 2 months: Mobile Home Lives with:: Self (has support of sister.) Patient language and need for interpreter reviewed:: Yes Do you feel safe going back to the place where you live?: Yes      Need for Family Participation in Patient Care: Yes (Comment) Care giver support system in place?: No (comment)   Criminal Activity/Legal Involvement Pertinent to Current Situation/Hospitalization: No - Comment as needed  Activities of Daily Living   ADL Screening (condition at time of admission) Independently performs ADLs?: Yes (appropriate for developmental age) Is the patient deaf or have difficulty hearing?: No Does the patient have difficulty seeing, even when wearing glasses/contacts?: No Does the patient have difficulty  concentrating, remembering, or making decisions?: No  Permission Sought/Granted Permission sought to share information with : Family Supports, Case Manager                Emotional Assessment Appearance:: Appears stated age Attitude/Demeanor/Rapport: Engaged Affect (typically observed): Appropriate Orientation: : Oriented to Self, Oriented to Place, Oriented to  Time Alcohol / Substance Use: Not Applicable Psych Involvement: No (comment)  Admission diagnosis:  Atrial fibrillation with rapid ventricular response (HCC) [I48.91] Sepsis (HCC) [A41.9] Community acquired pneumonia of right upper lobe of lung [J18.9] Acute sepsis (HCC) [A41.9] Patient Active Problem List   Diagnosis Date Noted   Sepsis (HCC) 08/27/2023   Esophageal dysphagia 04/10/2018   History of colonic diverticulitis 04/10/2018   Ingrowing toenail of left foot 02/28/2018   Pain around toenail, left foot 02/28/2018   Cigarette smoker 11/13/2017   Stage 3 severe COPD by GOLD classification (HCC) 11/13/2017   Dilated cardiomyopathy (HCC) 11/06/2016   Current use of long term anticoagulation 11/06/2016   Dyslipidemia 11/06/2016   On amiodarone  therapy 11/06/2016   CAD in native artery 08/02/2016   DOE (dyspnea on exertion) 08/02/2016   Other specified postprocedural states 11/11/2015   Paroxysmal atrial flutter (HCC) 11/11/2015   Acute renal failure (HCC) 10/22/2015   Esophageal reflux 10/22/2015   Acute on chronic systolic heart failure (HCC) 08/31/2014   Paroxysmal atrial fibrillation (HCC) 08/31/2014   Tobacco use disorder 08/31/2014   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 [place] [address] Phone: [phone] Fax: [phone]  [facility] [address] [place] [address] Phone: [phone] Fax: [phone]     Social Drivers of Health  (SDOH) Social History: SDOH Screenings   Food Insecurity: No Food Insecurity (08/28/2023)  Housing: Low Risk  (08/28/2023)  Transportation Needs: No Transportation Needs (08/28/2023)  Utilities: Not At Risk (08/28/2023)  Tobacco Use: High Risk (08/27/2023)   SDOH Interventions:     Readmission Risk Interventions    08/30/2023    1:18 PM  Readmission Risk Prevention Plan  Transportation Screening Complete  HRI or Home Care Consult Complete  Social Work Consult for Recovery Care Planning/Counseling Complete  Palliative Care Screening Not Applicable  Medication Review Oceanographer) Referral to Pharmacy

## 2023-08-30 NOTE — Progress Notes (Signed)

## 2023-08-31 DIAGNOSIS — J9600 Acute respiratory failure, unspecified whether with hypoxia or hypercapnia: Secondary | ICD-10-CM | POA: Diagnosis not present

## 2023-08-31 DIAGNOSIS — N179 Acute kidney failure, unspecified: Secondary | ICD-10-CM | POA: Diagnosis not present

## 2023-08-31 DIAGNOSIS — N183 Chronic kidney disease, stage 3 unspecified: Secondary | ICD-10-CM | POA: Diagnosis not present

## 2023-08-31 DIAGNOSIS — R739 Hyperglycemia, unspecified: Secondary | ICD-10-CM

## 2023-08-31 DIAGNOSIS — J154 Pneumonia due to other streptococci: Secondary | ICD-10-CM | POA: Diagnosis not present

## 2023-08-31 LAB — CBC
HCT: 30.7 % — ABNORMAL LOW (ref 39.0–52.0)
Hemoglobin: 9.7 g/dL — ABNORMAL LOW (ref 13.0–17.0)
MCH: 26.5 pg (ref 26.0–34.0)
MCHC: 31.6 g/dL (ref 30.0–36.0)
MCV: 83.9 fL (ref 80.0–100.0)
Platelets: 311 K/uL (ref 150–400)
RBC: 3.66 MIL/uL — ABNORMAL LOW (ref 4.22–5.81)
RDW: 17.2 % — ABNORMAL HIGH (ref 11.5–15.5)
WBC: 18.2 K/uL — ABNORMAL HIGH (ref 4.0–10.5)
nRBC: 0 % (ref 0.0–0.2)

## 2023-08-31 LAB — COMPREHENSIVE METABOLIC PANEL WITH GFR
ALT: 22 U/L (ref 0–44)
AST: 31 U/L (ref 15–41)
Albumin: 3.2 g/dL — ABNORMAL LOW (ref 3.5–5.0)
Alkaline Phosphatase: 75 U/L (ref 38–126)
Anion gap: 8 (ref 5–15)
BUN: 48 mg/dL — ABNORMAL HIGH (ref 8–23)
CO2: 32 mmol/L (ref 22–32)
Calcium: 8.5 mg/dL — ABNORMAL LOW (ref 8.9–10.3)
Chloride: 103 mmol/L (ref 98–111)
Creatinine, Ser: 1.51 mg/dL — ABNORMAL HIGH (ref 0.61–1.24)
GFR, Estimated: 52 mL/min — ABNORMAL LOW (ref >60)
Glucose, Bld: 164 mg/dL — ABNORMAL HIGH (ref 70–99)
Potassium: 4.2 mmol/L (ref 3.5–5.1)
Sodium: 143 mmol/L (ref 135–145)
Total Bilirubin: 0.7 mg/dL (ref 0.0–1.2)
Total Protein: 5.9 g/dL — ABNORMAL LOW (ref 6.5–8.1)

## 2023-08-31 LAB — CULTURE, RESPIRATORY W GRAM STAIN

## 2023-08-31 LAB — C3 COMPLEMENT: C3 Complement: 75 mg/dL — ABNORMAL LOW (ref 82–167)

## 2023-08-31 LAB — ANCA PROFILE
Anti-MPO Antibodies: <0.2 U (ref 0.0–0.9)
Anti-PR3 Antibodies: <0.2 U (ref 0.0–0.9)
Atypical P-ANCA titer: <1:20 {titer}
C-ANCA: <1:20 {titer}
P-ANCA: <1:20 {titer}

## 2023-08-31 LAB — ANTI-SCLERODERMA ANTIBODY: Scleroderma (Scl-70) (ENA) Antibody, IgG: <0.2 AI (ref 0.0–0.9)

## 2023-08-31 LAB — HEMOGLOBIN A1C
Hgb A1c MFr Bld: 5.6 % (ref 4.8–5.6)
Mean Plasma Glucose: 114.02 mg/dL

## 2023-08-31 LAB — C4 COMPLEMENT: Complement C4, Body Fluid: 7 mg/dL — ABNORMAL LOW (ref 12–38)

## 2023-08-31 LAB — ANTINUCLEAR ANTIBODIES, IFA: ANA Ab, IFA: NEGATIVE

## 2023-08-31 LAB — ALDOLASE: Aldolase: 17.2 U/L — ABNORMAL HIGH (ref 3.3–10.3)

## 2023-08-31 LAB — CYCLIC CITRUL PEPTIDE ANTIBODY, IGG/IGA: CCP Antibodies IgG/IgA: 8 U (ref 0–19)

## 2023-08-31 LAB — RHEUMATOID FACTOR: Rheumatoid fact SerPl-aCnc: 16.4 [IU]/mL — ABNORMAL HIGH (ref <14.0)

## 2023-08-31 MED ORDER — METHYLPREDNISOLONE SODIUM SUCC 40 MG IJ SOLR
40.0000 mg | INTRAMUSCULAR | Status: AC
  Administered 2023-09-01 – 2023-09-02 (×2): 40 mg via INTRAVENOUS
  Filled 2023-08-31 (×2): qty 1

## 2023-08-31 MED ORDER — ORAL CARE MOUTH RINSE
15.0000 mL | OROMUCOSAL | Status: DC
  Administered 2023-08-31 – 2023-09-07 (×20): 15 mL via OROMUCOSAL

## 2023-08-31 MED ORDER — ORAL CARE MOUTH RINSE
15.0000 mL | OROMUCOSAL | Status: DC | PRN
Start: 2023-08-31 — End: 2023-09-07

## 2023-08-31 MED ORDER — SODIUM CHLORIDE 3 % IN NEBU
4.0000 mL | INHALATION_SOLUTION | Freq: Two times a day (BID) | RESPIRATORY_TRACT | Status: AC
  Administered 2023-08-31 – 2023-09-02 (×5): 4 mL via RESPIRATORY_TRACT
  Filled 2023-08-31 (×7): qty 4

## 2023-08-31 NOTE — Progress Notes (Signed)
 NAME:  [name], MRN:  [id number], DOB:  September 10, 1960, LOS: 4 ADMISSION DATE:  08/27/2023, CONSULTATION DATE:  7/15 REFERRING MD:  TRH, CHIEF COMPLAINT:  hypotension    History of Present Illness:  63yo male smoker with hx HTN, COPD, PAF on eliquis , NICM, HFpEF presented 7/14 with 1 week progressive SOB, cough, malaise, poor appetite. On EMS arrival with hypoxic with sats 80% on RA. In ER initially BP was soft with SBP 80's, tachy 130's but responded well to 1L fluids. CXR revealed dense RUL PNA. He was admitted by TRH, started on IV rocephin /azithro. On 7/15 he was still boarding in ER and again having hypotension with SBP 80's and PCCM was consulted.  Lactate 1.4 WBC 34  Pertinent  Medical History   has a past medical history of Anxiety, Arthritis, Cardiomyopathy (HCC), CHF (congestive heart failure) (HCC), COPD (chronic obstructive pulmonary disease) (HCC), Dysphagia, Dysrhythmia, GERD (gastroesophageal reflux disease), History of blood transfusion, and Hypertension.   Significant Hospital Events: Including procedures, antibiotic start and stop dates in addition to other pertinent events   CT chest 7/14>> Extensive airspace consolidation in the right upper lobe with lesser changes in the right middle and lower lobes consistent with multifocal infiltrate. Short-term follow-up following appropriate therapy is recommended to assess for resolution. Echo 7/15>>>LVEF 55-60% no WMA, LA dilated 7/17 Worsening resp status this am, feels like he cannot catch breath, anxious as well. Got lasix , HHFO2, dex for restlessness.BLEUS neg for DVT 7/18 breathing better 70% FiO2 on heated high flow adding additional pulmonary hygiene measures  Interim History / Subjective:   Feels a little better today  Objective    Blood pressure 115/80, pulse 82, temperature 97.7 F (36.5 C), temperature source Oral, resp. rate 15, height 5' 9 (1.753 m), weight 80.8 kg, SpO2 100%.    FiO2 (%):  [75 %-80 %] 80 %    Intake/Output Summary (Last 24 hours) at 08/31/2023 9076 Last data filed at 08/31/2023 0600 Gross per 24 hour  Intake 2013.07 ml  Output 3800 ml  Net -1786.93 ml   Filed Weights   08/28/23 1716 08/30/23 0255  Weight: 77.8 kg 80.8 kg    Examination: General 63 year old male patient lying in bed no acute distress this morning still on 70% heated high flow HEENT normocephalic atraumatic no jugular venous distention is appreciated Pulmonary: Decreased right upper lobe, currently no accessory use, cough with occasional rhonchi. Cardiac: Regular rate and rhythm without murmur rub or gallop Abdomen: Soft nontender Extremities: Warm dry brisk cap refill Neuro: Awake oriented no focal deficits.  Resolved problem list  Septic shock Assessment and Plan   Acute respiratory failure 2/2 RUL strep constellatus PNA (CAP vs asp) c/b AECOPD and some element of volume overload on 7/17 Tobacco abuse Plan Cont to wean FIO2 for sats > 90% Day 5 systemic steroids. I think we can dec to 40mg / will complete 2 more days then discontinue Scheduled BDs, also adding hypertonic saline neb and EZ Pap/equivalent He is day 5 abx (doxy and zosyn ) Awaiting sensitivities; given how dense his PNA is we will plan on 10d course  Daily assessment for diuretics, will hold off for today Will need f/u CT imaging at 4-6 weeks Flutter valve/mobilize/chest PT Hx COPD, + emphysema on imaging in flare Pcxr am  Prob need to consider repeat esophagram at some point given h/o stricture seems like his dysphagia is esophageal in nature  Continuing low-dose Precedex , seems to be working nicely for work of breathing and comfort  Hx NICM/HFpEF and CAD Plan Cont tele  Daily assessment for diuretics  AKI on CKD 3  Slight bump in scr. Current vol status -1.7 Plan Hold off on further lasix  today  Trend chem Keep even today   Hyperglycemia. Likely exacerbated by steroids Currently w/in goal 140-180 Plan Dropping steroid  dose  If am chem w/ hyperglycemia add ssi and qid CBGs  Best Practice (right click and Reselect all SmartList Selections daily)   Diet/type: dysphagia DVT prophylaxis eliquis  Pressure ulcer(s): N/A GI prophylaxis: N/A Lines: N/A Foley:  N/A Code Status:  full code Last date of multidisciplinary goals of care discussion [patient updated]   Looks a little better, if his oxygen requirement stayed the same I think he can move out of the intensive care tomorrow  Critical care time is 32 minutes

## 2023-09-01 DIAGNOSIS — N183 Chronic kidney disease, stage 3 unspecified: Secondary | ICD-10-CM | POA: Diagnosis not present

## 2023-09-01 DIAGNOSIS — J9600 Acute respiratory failure, unspecified whether with hypoxia or hypercapnia: Secondary | ICD-10-CM | POA: Diagnosis not present

## 2023-09-01 DIAGNOSIS — N179 Acute kidney failure, unspecified: Secondary | ICD-10-CM | POA: Diagnosis not present

## 2023-09-01 DIAGNOSIS — J154 Pneumonia due to other streptococci: Secondary | ICD-10-CM | POA: Diagnosis not present

## 2023-09-01 LAB — COMPREHENSIVE METABOLIC PANEL WITH GFR
ALT: 32 U/L (ref 0–44)
AST: 38 U/L (ref 15–41)
Albumin: 2.6 g/dL — ABNORMAL LOW (ref 3.5–5.0)
Alkaline Phosphatase: 80 U/L (ref 38–126)
Anion gap: 7 (ref 5–15)
BUN: 44 mg/dL — ABNORMAL HIGH (ref 8–23)
CO2: 31 mmol/L (ref 22–32)
Calcium: 8.5 mg/dL — ABNORMAL LOW (ref 8.9–10.3)
Chloride: 104 mmol/L (ref 98–111)
Creatinine, Ser: 1.03 mg/dL (ref 0.61–1.24)
GFR, Estimated: >60 mL/min (ref >60)
Glucose, Bld: 175 mg/dL — ABNORMAL HIGH (ref 70–99)
Potassium: 4.1 mmol/L (ref 3.5–5.1)
Sodium: 142 mmol/L (ref 135–145)
Total Bilirubin: 0.9 mg/dL (ref 0.0–1.2)
Total Protein: 5.9 g/dL — ABNORMAL LOW (ref 6.5–8.1)

## 2023-09-01 LAB — CULTURE, BLOOD (ROUTINE X 2)
Culture: NO GROWTH
Culture: NO GROWTH

## 2023-09-01 LAB — CBC
HCT: 34.1 % — ABNORMAL LOW (ref 39.0–52.0)
Hemoglobin: 11 g/dL — ABNORMAL LOW (ref 13.0–17.0)
MCH: 26.6 pg (ref 26.0–34.0)
MCHC: 32.3 g/dL (ref 30.0–36.0)
MCV: 82.6 fL (ref 80.0–100.0)
Platelets: 349 K/uL (ref 150–400)
RBC: 4.13 MIL/uL — ABNORMAL LOW (ref 4.22–5.81)
RDW: 17.3 % — ABNORMAL HIGH (ref 11.5–15.5)
WBC: 27.5 K/uL — ABNORMAL HIGH (ref 4.0–10.5)
nRBC: 0 % (ref 0.0–0.2)

## 2023-09-01 LAB — GLUCOSE, CAPILLARY
Glucose-Capillary: 166 mg/dL — ABNORMAL HIGH (ref 70–99)
Glucose-Capillary: 176 mg/dL — ABNORMAL HIGH (ref 70–99)

## 2023-09-01 MED ORDER — METOPROLOL TARTRATE 5 MG/5ML IV SOLN
5.0000 mg | Freq: Once | INTRAVENOUS | Status: AC
  Administered 2023-09-01: 5 mg via INTRAVENOUS
  Filled 2023-09-01: qty 5

## 2023-09-01 MED ORDER — METOPROLOL TARTRATE 25 MG PO TABS
25.0000 mg | ORAL_TABLET | Freq: Two times a day (BID) | ORAL | Status: DC
  Administered 2023-09-01 – 2023-09-06 (×9): 25 mg via ORAL
  Filled 2023-09-01 (×10): qty 1

## 2023-09-01 MED ORDER — AMIODARONE IV BOLUS ONLY 150 MG/100ML
150.0000 mg | Freq: Once | INTRAVENOUS | Status: AC
  Administered 2023-09-01: 150 mg via INTRAVENOUS

## 2023-09-01 NOTE — Progress Notes (Signed)
 Contacted by RN.  Heart rate remains elevated 120s - 130s in RVR.  Blood pressures appear improved.  Already on amiodarone  p.o.  5 mg IV metoprolol  x 1 ordered.  Will start metoprolol  tartrate 25 mg twice daily starting this evening.

## 2023-09-01 NOTE — Progress Notes (Signed)
 NAME:  [name], MRN:  [id number], DOB:  Jul 25, 1960, LOS: 5 ADMISSION DATE:  08/27/2023, CONSULTATION DATE:  7/15 REFERRING MD:  TRH, CHIEF COMPLAINT:  hypotension    History of Present Illness:  63yo male smoker with hx HTN, COPD, PAF on eliquis , NICM, HFpEF presented 7/14 with 1 week progressive SOB, cough, malaise, poor appetite. On EMS arrival with hypoxic with sats 80% on RA. In ER initially BP was soft with SBP 80's, tachy 130's but responded well to 1L fluids. CXR revealed dense RUL PNA. He was admitted by TRH, started on IV rocephin /azithro. On 7/15 he was still boarding in ER and again having hypotension with SBP 80's and PCCM was consulted.  Lactate 1.4 WBC 34  Pertinent  Medical History   has a past medical history of Anxiety, Arthritis, Cardiomyopathy (HCC), CHF (congestive heart failure) (HCC), COPD (chronic obstructive pulmonary disease) (HCC), Dysphagia, Dysrhythmia, GERD (gastroesophageal reflux disease), History of blood transfusion, and Hypertension.   Significant Hospital Events: Including procedures, antibiotic start and stop dates in addition to other pertinent events   CT chest 7/14>> Extensive airspace consolidation in the right upper lobe with lesser changes in the right middle and lower lobes consistent with multifocal infiltrate. Short-term follow-up following appropriate therapy is recommended to assess for resolution. Echo 7/15>>>LVEF 55-60% no WMA, LA dilated 7/17 Worsening resp status this am, feels like he cannot catch breath, anxious as well. Got lasix , HHFO2, dex for restlessness.BLEUS neg for DVT 7/18 breathing better 70% FiO2 on heated high flow adding additional pulmonary hygiene measures  Interim History / Subjective:  Patient stated feeling better, continue to remain on heated high flow nasal cannula oxygen, currently FiO2 is at 45% and 30 L flow Remained afebrile  Objective    Blood pressure (!) 89/60, pulse 92, temperature 97.6 F (36.4 C),  temperature source Axillary, resp. rate (!) 32, height 5' 9 (1.753 m), weight 80.8 kg, SpO2 94%.    FiO2 (%):  [60 %-80 %] 60 %   Intake/Output Summary (Last 24 hours) at 09/01/2023 1057 Last data filed at 09/01/2023 0800 Gross per 24 hour  Intake 812.96 ml  Output 1225 ml  Net -412.04 ml   Filed Weights   08/28/23 1716 08/30/23 0255  Weight: 77.8 kg 80.8 kg    Examination: General: Acute on chronically ill-appearing male, lying on the bed HEENT: [place]/AT, eyes anicteric.  moist mucus membranes Neuro: Alert, awake following commands Chest: Reduced air entry on right side, clear to auscultation left, no wheezes or rhonchi Heart: Irregularly irregular, no murmurs or gallops Abdomen: Soft, nontender, nondistended, bowel sounds present  Labs and images reviewed  Resolved problem list  Septic shock  Assessment and Plan  Acute respiratory failure with hypoxia Severe sepsis with septic shock due to right upper lobe pneumonia with Streptococcus constellatus Tobacco dependence Acute COPD exacerbation Chronic HFpEF Paroxysmal A-fib Coronary artery disease AKI due to septic ATN Steroid-induced hyperglycemia  Continue heated high flow nasal cannula oxygen, titrate with O2 sat goal 88-92% Continue IV antibiotics with Zosyn  Remain off vasopressors for few days Sepsis in dialysis have improved Counseling provided regarding tobacco cessation Continue steroid and nebs Continue Lasix  Remain in A-fib with controlled rate Continue amiodarone  and Eliquis  for stroke prophylaxis Serum creatinine has improved, avoid nephrotoxic agent Monitor intake and output Continue insulin with CBG goal 140-180 Patient will need repeat CT chest in 4 to 6 weeks to evaluate for resolution of right upper lobe cavitary pneumonia   Best Practice (right click and  Reselect all SmartList Selections daily)   Diet/type: dysphagia diet DVT prophylaxis eliquis  Pressure ulcer(s): Please see nursing notes GI  prophylaxis: N/A Lines: N/A Foley:  N/A Code Status:  full code Last date of multidisciplinary goals of care discussion: 7/19 patient was updated at bedside, decision was to continue full scope of care     Valinda Novas, MD [facility] See Amion for pager If no response to pager, please call [phone] until 7pm After 7pm, Please call E-link [phone]

## 2023-09-01 NOTE — Plan of Care (Signed)

## 2023-09-01 NOTE — Progress Notes (Signed)
 Patient is in Afib with HR 120s-130s, PCCM On call notified, metoprolol  5mg  given, will continue to monitor.    09/01/23 1848  Vitals  BP 138/76  MAP (mmHg) 91  BP Location Right Arm  BP Method Automatic  Patient Position (if appropriate) Lying  Pulse Rate (!) 129  Pulse Rate Source Monitor  ECG Heart Rate (!) 127  Resp 18  MEWS COLOR  MEWS Score Color Yellow  Oxygen Therapy  SpO2 90 %  O2 Device HHFNC  O2 Flow Rate (L/min) 30 L/min  Patient Activity (if Appropriate) In chair  Pulse Oximetry Type Continuous  MEWS Score  MEWS Temp 0  MEWS Systolic 0  MEWS Pulse 2  MEWS RR 0  MEWS LOC 0  MEWS Score 2

## 2023-09-01 NOTE — Progress Notes (Signed)
 Pt received from 2HR, V/S obtained, CCMD notified, patient is on NRB 15liters, RT notified by [facility] RN, patient denies any complains, all needs met, call bell in reach.   09/01/23 1753  Vitals  Temp (!) 97.5 F (36.4 C)  Temp Source Oral  BP 126/85  MAP (mmHg) 97  BP Location Right Arm  BP Method Automatic  Patient Position (if appropriate) Lying  Pulse Rate (!) 121  Pulse Rate Source Monitor  ECG Heart Rate (!) 131  Resp 20  Level of Consciousness  Level of Consciousness Alert  MEWS COLOR  MEWS Score Color Yellow  Oxygen Therapy  SpO2 98 %  O2 Device Non-rebreather Mask  O2 Flow Rate (L/min) 15 L/min  Patient Activity (if Appropriate) In chair  Pulse Oximetry Type Continuous  MEWS Score  MEWS Temp 0  MEWS Systolic 0  MEWS Pulse 3  MEWS RR 0  MEWS LOC 0  MEWS Score 3

## 2023-09-02 DIAGNOSIS — A419 Sepsis, unspecified organism: Secondary | ICD-10-CM | POA: Diagnosis not present

## 2023-09-02 LAB — COMPREHENSIVE METABOLIC PANEL WITH GFR
ALT: 81 U/L — ABNORMAL HIGH (ref 0–44)
AST: 76 U/L — ABNORMAL HIGH (ref 15–41)
Albumin: 2.6 g/dL — ABNORMAL LOW (ref 3.5–5.0)
Alkaline Phosphatase: 93 U/L (ref 38–126)
Anion gap: 8 (ref 5–15)
BUN: 44 mg/dL — ABNORMAL HIGH (ref 8–23)
CO2: 31 mmol/L (ref 22–32)
Calcium: 8.6 mg/dL — ABNORMAL LOW (ref 8.9–10.3)
Chloride: 103 mmol/L (ref 98–111)
Creatinine, Ser: 0.9 mg/dL (ref 0.61–1.24)
GFR, Estimated: >60 mL/min (ref >60)
Glucose, Bld: 115 mg/dL — ABNORMAL HIGH (ref 70–99)
Potassium: 4.3 mmol/L (ref 3.5–5.1)
Sodium: 142 mmol/L (ref 135–145)
Total Bilirubin: 0.8 mg/dL (ref 0.0–1.2)
Total Protein: 5.8 g/dL — ABNORMAL LOW (ref 6.5–8.1)

## 2023-09-02 LAB — CBC
HCT: 32.4 % — ABNORMAL LOW (ref 39.0–52.0)
Hemoglobin: 10.7 g/dL — ABNORMAL LOW (ref 13.0–17.0)
MCH: 26.9 pg (ref 26.0–34.0)
MCHC: 33 g/dL (ref 30.0–36.0)
MCV: 81.4 fL (ref 80.0–100.0)
Platelets: 361 K/uL (ref 150–400)
RBC: 3.98 MIL/uL — ABNORMAL LOW (ref 4.22–5.81)
RDW: 17.4 % — ABNORMAL HIGH (ref 11.5–15.5)
WBC: 29.9 K/uL — ABNORMAL HIGH (ref 4.0–10.5)
nRBC: 0 % (ref 0.0–0.2)

## 2023-09-02 MED ORDER — CHLORHEXIDINE GLUCONATE CLOTH 2 % EX PADS
6.0000 | MEDICATED_PAD | Freq: Every day | CUTANEOUS | Status: DC
  Administered 2023-09-02 – 2023-09-08 (×6): 6 via TOPICAL

## 2023-09-02 MED ORDER — THIAMINE MONONITRATE 100 MG PO TABS
100.0000 mg | ORAL_TABLET | Freq: Every day | ORAL | Status: DC
  Administered 2023-09-03 – 2023-09-06 (×4): 100 mg via ORAL
  Filled 2023-09-02 (×4): qty 1

## 2023-09-02 NOTE — Discharge Instructions (Addendum)
 Information on my medicine - ELIQUIS  (apixaban )  This medication education was reviewed with me or my healthcare representative as part of my discharge preparation.  The pharmacist that spoke with me during my hospital stay was:    Why was Eliquis  prescribed for you? Eliquis  was prescribed for you to reduce the risk of forming blood clots that can cause a stroke if you have a medical condition called atrial fibrillation (a type of irregular heartbeat) OR to reduce the risk of a blood clots forming after orthopedic surgery.  What do You need to know about Eliquis  ? Take your Eliquis  TWICE DAILY - one tablet in the morning and one tablet in the evening with or without food.  It would be best to take the doses about the same time each day.  If you have difficulty swallowing the tablet whole please discuss with your pharmacist how to take the medication safely.  Take Eliquis  exactly as prescribed by your doctor and DO NOT stop taking Eliquis  without talking to the doctor who prescribed the medication.  Stopping may increase your risk of developing a new clot or stroke.  Refill your prescription before you run out.  After discharge, you should have regular check-up appointments with your healthcare provider that is prescribing your Eliquis .  In the future your dose may need to be changed if your kidney function or weight changes by a significant amount or as you get older.  What do you do if you miss a dose? If you miss a dose, take it as soon as you remember on the same day and resume taking twice daily.  Do not take more than one dose of ELIQUIS  at the same time.  Important Safety Information A possible side effect of Eliquis  is bleeding. You should call your healthcare provider right away if you experience any of the following: Bleeding from an injury or your nose that does not stop. Unusual colored urine (red or dark brown) or unusual colored stools (red or black). Unusual bruising for  unknown reasons. A serious fall or if you hit your head (even if there is no bleeding).  Some medicines may interact with Eliquis  and might increase your risk of bleeding or clotting while on Eliquis . To help avoid this, consult your healthcare provider or pharmacist prior to using any new prescription or non-prescription medications, including herbals, vitamins, non-steroidal anti-inflammatory drugs (NSAIDs) and supplements.  This website has more information on Eliquis  (apixaban ): http://www.eliquis .com/eliquis /home     Check your BP and Heart rate before taking meds. Do not take amiodarone  if your systolic BP is less than 95 or heart rate is less than 60.

## 2023-09-02 NOTE — Progress Notes (Signed)
 PROGRESS NOTE    [name]  FMW:[id number] DOB: 04-Dec-1960 DOA: 08/27/2023 PCP: Zena Frederick, DO   Brief Narrative:  63yo male smoker with hx HTN, COPD, PAF on eliquis , NICM, HFpEF presented 7/14 with 1 week progressive SOB, cough, malaise, poor appetite. On EMS arrival with hypoxic with sats 80% on RA. In ER initially BP was soft with SBP 80's, tachy 130's but responded well to 1L fluids. CXR revealed dense RUL PNA. He was admitted by [facility], started on IV rocephin /azithro. On 7/15 he was still boarding in ER and again having hypotension with SBP 80's and went into septic shock for which PCCM consulted and patient transitioned under critical care team.  Eventually off of vasopressors and oxygen demand improved so he was transitioned back into [facility] care on 09/02/2023.    Assessment & Plan:   Principal Problem:   Acute sepsis (HCC)  Septic shock and acute respiratory failure with hypoxia secondary to right upper lobe pneumonia: Severe sepsis and acute respiratory failure with hypoxia present at admission.  Patient developed septic shock next day of the admission.  Initially was on Rocephin  and Zithromax , antibiotics were transitioned to Zosyn .  Patient required vasopressors and has been off of vasopressor since few days.  Urine antigen for Legionella and streptococci negative.  COVID, RSV and flu negative.  Respiratory viral panel negative.  AFB negative.  Lactic acidosis resolved.  Respiratory culture growing Streptococcus constellatus.  Patient on Zosyn .  Still requiring 30% of high flow oxygen with FiO2 45%.  Tachycardia and tachypnea has resolved.  Wean as able to.  Continue incentive spirometry and flutter valve.  Continue bronchodilators and Solu-Medrol .  Will need repeat CT scan in 4 to 6 weeks.   Elevated troponin: Slightly elevated and flat, echo ruled out WMA.  Likely demand ischemia.  AKI due to septic ATN: Creatinine improved.   History of paroxysmal A-fib with RVR- Continue home dose  amiodarone , Eliquis  therapy.  Overnight was started on metoprolol  25 mg p.o. twice daily.  Rate is now better controlled.   Dysphagia: Seen by SLP.  Currently on dysphagia 3 diet.  Hyperlipidemia: Continue statin.  DVT prophylaxis: Eliquis    Code Status: Full Code  Family Communication:  None present at bedside.  Plan of care discussed with patient in length and he/she verbalized understanding and agreed with it.  Status is: Inpatient Remains inpatient appropriate because: Still on high flow oxygen with   Estimated body mass index is 26.31 kg/m as calculated from the following:   Height as of this encounter: 5' 9 (1.753 m).   Weight as of this encounter: 80.8 kg.    Nutritional Assessment: Body mass index is 26.31 kg/m.[name] Seen by dietician.  I agree with the assessment and plan as outlined below: Nutrition Status:        . Skin Assessment: I have examined the patient's skin and I agree with the wound assessment as performed by the wound care RN as outlined below:    Consultants:  None  Procedures:  As above  Antimicrobials:  Anti-infectives (From admission, onward)    Start     Dose/Rate Route Frequency Ordered Stop   08/29/23 1015  piperacillin -tazobactam (ZOSYN ) IVPB 3.375 g        3.375 g 12.5 mL/hr over 240 Minutes Intravenous Every 8 hours 08/29/23 0922     08/29/23 1000  doxycycline  (VIBRAMYCIN ) 100 mg in sodium chloride  0.9 % 250 mL IVPB  Status:  Discontinued        100  mg 125 mL/hr over 120 Minutes Intravenous Every 12 hours 08/29/23 0917 08/31/23 1136   08/28/23 1000  azithromycin  (ZITHROMAX ) 500 mg in sodium chloride  0.9 % 250 mL IVPB  Status:  Discontinued        500 mg 250 mL/hr over 60 Minutes Intravenous Every 24 hours 08/27/23 2337 08/29/23 0917   08/28/23 1000  cefTRIAXone  (ROCEPHIN ) 2 g in sodium chloride  0.9 % 100 mL IVPB  Status:  Discontinued        2 g 200 mL/hr over 30 Minutes Intravenous Every 24 hours 08/27/23 2337 08/29/23 0917    08/27/23 1545  cefTRIAXone  (ROCEPHIN ) 1 g in sodium chloride  0.9 % 100 mL IVPB        1 g 200 mL/hr over 30 Minutes Intravenous  Once 08/27/23 1541 08/27/23 1716   08/27/23 1545  azithromycin  (ZITHROMAX ) 500 mg in sodium chloride  0.9 % 250 mL IVPB        500 mg 250 mL/hr over 60 Minutes Intravenous  Once 08/27/23 1541 08/27/23 1746         Subjective: Patient seen and examined, despite of being on high flow oxygen, he is able to speak in full sentences without any dyspnea and denies any shortness of breath or any other complaint.  Objective: Vitals:   09/01/23 1949 09/01/23 2042 09/01/23 2254 09/02/23 0337  BP: (!) 148/89  (!) 144/94 120/83  Pulse: (!) 107  99 100  Resp: 20  16 13   Temp: 97.8 F (36.6 C)  98 F (36.7 C) 97.7 F (36.5 C)  TempSrc: Oral  Oral Oral  SpO2: 91% 91% 91% 93%  Weight:      Height:        Intake/Output Summary (Last 24 hours) at 09/02/2023 0810 Last data filed at 09/01/2023 2254 Gross per 24 hour  Intake 59.11 ml  Output 475 ml  Net -415.89 ml   Filed Weights   08/28/23 1716 08/30/23 0255  Weight: 77.8 kg 80.8 kg    Examination:  General exam: Appears calm and comfortable  Respiratory system: Diminished breath sounds.  Respiratory effort normal. Cardiovascular system: S1 & S2 heard, RRR. No JVD, murmurs, rubs, gallops or clicks. No pedal edema. Gastrointestinal system: Abdomen is nondistended, soft and nontender. No organomegaly or masses felt. Normal bowel sounds heard. Central nervous system: Alert and oriented. No focal neurological deficits. Extremities: Symmetric 5 x 5 power. Skin: No rashes, lesions or ulcers Psychiatry: Judgement and insight appear normal. Mood & affect appropriate.    Data Reviewed: I have personally reviewed following labs and imaging studies  CBC: Recent Labs  Lab 08/27/23 1518 08/27/23 1551 08/29/23 0744 08/30/23 0840 08/31/23 0405 09/01/23 0436 09/02/23 0400  WBC 35.2*   < > 41.1* 24.7* 18.2* 27.5*  29.9*  NEUTROABS 31.3*  --   --   --   --   --   --   HGB 13.6   < > 11.6* 10.3* 9.7* 11.0* 10.7*  HCT 41.3   < > 35.1* 32.4* 30.7* 34.1* 32.4*  MCV 80.8   < > 80.1 83.1 83.9 82.6 81.4  PLT 570*   < > 498* 359 311 349 361   < > = values in this interval not displayed.   Basic Metabolic Panel: Recent Labs  Lab 08/27/23 1537 08/27/23 1551 08/28/23 0605 08/29/23 0744 08/30/23 0840 08/31/23 0405 09/01/23 0436 09/02/23 0400  NA  --    < > 138 137 144 143 142 142  K  --    < >  4.0 3.7 3.8 4.2 4.1 4.3  CL  --   --  100 100 107 103 104 103  CO2  --   --  24 22 28  32 31 31  GLUCOSE  --   --  134* 142* 120* 164* 175* 115*  BUN  --   --  75* 73* 53* 48* 44* 44*  CREATININE  --   --  1.80* 1.41* 0.89 1.51* 1.03 0.90  CALCIUM   --   --  8.1* 7.9* 8.3* 8.5* 8.5* 8.6*  MG 2.9*  --  2.9*  --   --   --   --   --   PHOS  --   --  6.0* 4.8*  --   --   --   --    < > = values in this interval not displayed.   GFR: Estimated Creatinine Clearance: 84 mL/min (by C-G formula based on SCr of 0.9 mg/dL). Liver Function Tests: Recent Labs  Lab 08/27/23 1518 08/28/23 0605 08/29/23 0744 08/31/23 0405 09/01/23 0436 09/02/23 0400  AST 60* 57*  --  31 38 76*  ALT 32 31  --  22 32 81*  ALKPHOS 110 103  --  75 80 93  BILITOT 1.6* 1.3*  --  0.7 0.9 0.8  PROT 6.7 6.2*  --  5.9* 5.9* 5.8*  ALBUMIN  1.8* 1.6* 1.5* 3.2* 2.6* 2.6*   No results for input(s): LIPASE, AMYLASE in the last 168 hours. No results for input(s): AMMONIA in the last 168 hours. Coagulation Profile: Recent Labs  Lab 08/27/23 1518  INR 1.5*   Cardiac Enzymes: Recent Labs  Lab 08/30/23 0922  CKTOTAL 118   BNP (last 3 results) No results for input(s): PROBNP in the last 8760 hours. HbA1C: Recent Labs    08/31/23 0405  HGBA1C 5.6   CBG: Recent Labs  Lab 09/01/23 1201 09/01/23 1626  GLUCAP 166* 176*   Lipid Profile: No results for input(s): CHOL, HDL, LDLCALC, TRIG, CHOLHDL, LDLDIRECT in the  last 72 hours. Thyroid Function Tests: No results for input(s): TSH, T4TOTAL, FREET4, T3FREE, THYROIDAB in the last 72 hours. Anemia Panel: Recent Labs    08/30/23 0922  FERRITIN 222   Sepsis Labs: Recent Labs  Lab 08/27/23 1552 08/27/23 2002 08/28/23 0605 08/28/23 1555 08/29/23 0744 08/30/23 0304  PROCALCITON  --   --  3.28  --  1.84 1.15  LATICACIDVEN 2.4* 2.4* 1.4 1.6  --   --     Recent Results (from the past 240 hours)  Culture, blood (routine x 2)     Status: None   Collection Time: 08/27/23  1:42 PM   Specimen: BLOOD  Result Value Ref Range Status   Specimen Description BLOOD LEFT ANTECUBITAL  Final   Special Requests   Final    BOTTLES DRAWN AEROBIC AND ANAEROBIC Blood Culture results may not be optimal due to an inadequate volume of blood received in culture bottles   Culture   Final    NO GROWTH 5 DAYS Performed at [facility] Lab, [address], [place], [place] [address]    Report Status 09/01/2023 FINAL  Final  Acid Fast Smear (AFB)     Status: None   Collection Time: 08/27/23  2:00 PM   Specimen: Sputum  Result Value Ref Range Status   AFB Specimen Processing Concentration  Final   Acid Fast Smear Negative  Final    Comment: (NOTE) Performed At: [facility] [address] [place], [place]  [id number] Jennette Shorter MD Ey:[id number]    Source (AFB) SPU  Final    Comment: Performed at [facility], [address], [place], [place] [address]  Culture, blood (routine x 2)     Status: None   Collection Time: 08/27/23  3:19 PM   Specimen: BLOOD RIGHT HAND  Result Value Ref Range Status   Specimen Description BLOOD RIGHT HAND  Final   Special Requests   Final    BOTTLES DRAWN AEROBIC AND ANAEROBIC Blood Culture results may not be optimal due to an inadequate volume of blood received in culture bottles   Culture   Final    NO GROWTH 5 DAYS Performed at [facility] Lab, [address], [place], [place] [address]    Report  Status 09/01/2023 FINAL  Final  SARS Coronavirus 2 by RT PCR (hospital order, performed in [facility] hospital lab) *cepheid single result test* Anterior Nasal Swab     Status: None   Collection Time: 08/27/23  3:42 PM   Specimen: Anterior Nasal Swab  Result Value Ref Range Status   SARS Coronavirus 2 by RT PCR NEGATIVE NEGATIVE Final    Comment: Performed at [facility] Lab, [address], [place], [place] [address]  Expectorated Sputum Assessment w Gram Stain, Rflx to Resp Cult     Status: None   Collection Time: 08/27/23 11:16 PM   Specimen: Sputum  Result Value Ref Range Status   Specimen Description SPU  Final   Special Requests NONE  Final   Sputum evaluation   Final    THIS SPECIMEN IS ACCEPTABLE FOR SPUTUM CULTURE Performed at [facility] Lab, [address], [place], [place] [address]    Report Status 08/28/2023 FINAL  Final  Culture, Respiratory w Gram Stain     Status: None   Collection Time: 08/27/23 11:16 PM   Specimen: Sputum  Result Value Ref Range Status   Specimen Description SPU  Final   Special Requests NONE Reflexed from [id number]  Final   Gram Stain   Final    ABUNDANT WBC PRESENT, PREDOMINANTLY PMN ABUNDANT GRAM POSITIVE COCCI FEW GRAM POSITIVE RODS Performed at [facility] Lab, [address] [place], [place] [address]    Culture ABUNDANT STREPTOCOCCUS CONSTELLATUS  Final   Report Status 08/31/2023 FINAL  Final   Organism ID, Bacteria STREPTOCOCCUS CONSTELLATUS  Final      Susceptibility   Streptococcus constellatus - MIC*    PENICILLIN INTERMEDIATE Intermediate     CEFTRIAXONE  1 SENSITIVE Sensitive     ERYTHROMYCIN 4 RESISTANT Resistant     LEVOFLOXACIN <=0.25 SENSITIVE Sensitive     VANCOMYCIN 0.5 SENSITIVE Sensitive     * ABUNDANT STREPTOCOCCUS CONSTELLATUS  Expectorated Sputum Assessment w Gram Stain, Rflx to Resp Cult     Status: None   Collection Time: 08/28/23  1:28 PM   Specimen: Expectorated Sputum  Result Value Ref Range Status    Specimen Description EXPECTORATED SPUTUM  Final   Special Requests NONE  Final   Sputum evaluation   Final    THIS SPECIMEN IS ACCEPTABLE FOR SPUTUM CULTURE Performed at [facility] Lab, [address], [place], [place] [address]    Report Status 08/28/2023 FINAL  Final  MTB-RIF NAA with AFB Culture, sputum (q8 x 3)     Status: None (Preliminary result)   Collection Time: 08/28/23  1:28 PM   Specimen: Sputum  Result Value Ref Range Status   Myco tuberculosis  Complex PENDING  Incomplete   Rifampin PENDING  Incomplete   AFB Specimen Processing Concentration  Final    Comment: (NOTE) Performed At: [facility] [address] [place], [place] [id number] Jennette Shorter MD Ey:[id number]    Acid Fast Culture PENDING  Incomplete   Source (MTB RIF) SPU  Final    Comment: Performed at [facility] Lab, [address], [place], [place] [address]  Culture, Respiratory w Gram Stain     Status: None   Collection Time: 08/28/23  1:28 PM  Result Value Ref Range Status   Specimen Description EXPECTORATED SPUTUM  Final   Special Requests NONE Reflexed from [id number]  Final   Gram Stain   Final    ABUNDANT WBC PRESENT, PREDOMINANTLY PMN ABUNDANT GRAM POSITIVE COCCI IN PAIRS FEW GRAM POSITIVE RODS RARE GRAM NEGATIVE RODS    Culture   Final    ABUNDANT STREPTOCOCCUS CONSTELLATUS SUSCEPTIBILITIES PERFORMED ON PREVIOUS CULTURE WITHIN THE LAST 5 DAYS. Performed at [facility] Lab, [address] [place], [place] [address]    Report Status 08/31/2023 FINAL  Final  MRSA Next Gen by PCR, Nasal     Status: None   Collection Time: 08/28/23  2:57 PM   Specimen: Nasal Mucosa; Nasal Swab  Result Value Ref Range Status   MRSA by PCR Next Gen NOT DETECTED NOT DETECTED Final    Comment: (NOTE) The GeneXpert MRSA Assay (FDA approved for NASAL specimens only), is one component of a comprehensive MRSA colonization surveillance program. It is not intended to diagnose MRSA infection nor to  guide or monitor treatment for MRSA infections. Test performance is not FDA approved in patients less than 43 years old. Performed at [facility] Lab, [address], [place], [place] [address]   MTB-RIF NAA with AFB Culture, sputum (q8 x 3)     Status: None (Preliminary result)   Collection Time: 08/28/23  9:28 PM   Specimen: Sputum  Result Value Ref Range Status   Myco tuberculosis Complex NOT DETECTED NOT DETECTED Final   Rifampin Not applicable NOT DETECTED Final   AFB Specimen Processing Concentration  Final    Comment: (NOTE) Performed At: [facility] [address] [place], [place] [id number] Jennette Shorter MD Ey:[id number]    Acid Fast Culture PENDING  Incomplete   Source (MTB RIF) SPU  Final    Comment: Performed at [facility] Lab, [address] [place], [place] [address]  MTB-RIF NAA with AFB Culture, sputum (q8 x 3)     Status: None (Preliminary result)   Collection Time: 08/28/23  9:30 PM   Specimen: Sputum  Result Value Ref Range Status   Myco tuberculosis Complex NOT DETECTED NOT DETECTED Final   Rifampin Not applicable NOT DETECTED Final   AFB Specimen Processing Concentration  Final    Comment: (NOTE) Performed At: [facility] [address] [place], [place] [id number] Jennette Shorter MD Ey:[id number]    Acid Fast Culture PENDING  Incomplete   Source (MTB RIF) SPUTUM  Final    Comment: Performed at [facility] Lab, [address] [place], [place] [address]     Radiology Studies: No results found.  Scheduled Meds:  amiodarone   200 mg Oral Daily   apixaban   5 mg Oral BID   arformoterol   15 mcg Nebulization BID   atorvastatin   40 mg Oral Daily   Chlorhexidine  Gluconate Cloth  6 each Topical Daily   feeding supplement  237 mL Oral BID  BM   methylPREDNISolone  (SOLU-MEDROL ) injection  40 mg Intravenous Q24H   metoprolol  tartrate  25 mg Oral BID   nicotine   21 mg Transdermal Daily   mouth rinse  15 mL Mouth Rinse 4 times per  day   pantoprazole   80 mg Oral Q1200   revefenacin   175 mcg Nebulization Daily   sodium chloride  flush  10-40 mL Intracatheter Q12H   sodium chloride  HYPERTONIC  4 mL Nebulization BID   thiamine  (VITAMIN B1) injection  100 mg Intravenous Daily   Continuous Infusions:  piperacillin -tazobactam (ZOSYN )  IV 3.375 g (09/02/23 0612)     LOS: 6 days   Fredia Skeeter, MD [facility]  09/02/2023, 8:10 AM   *Please note that this is a verbal dictation therefore any spelling or grammatical errors are due to the Dragon Medical One system interpretation.  Please page via Amion and do not message via secure chat for urgent patient care matters. Secure chat can be used for non urgent patient care matters.  How to contact the [facility] Attending or Consulting provider 7A - 7P or covering provider during after hours 7P -7A, for this patient?  Check the care team in [facility] and look for a) attending/consulting [facility] provider listed and b) the [facility] team listed. Page or secure chat 7A-7P. Log into [email or web address] and use [place]'s universal password to access. If you do not have the password, please contact the hospital operator. Locate the [facility] provider you are looking for under [facility] and page to a number that you can be directly reached. If you still have difficulty reaching the provider, please page the [facility] (Director on Call) for the Hospitalists listed on amion for assistance.

## 2023-09-02 NOTE — Plan of Care (Signed)

## 2023-09-03 DIAGNOSIS — A419 Sepsis, unspecified organism: Secondary | ICD-10-CM | POA: Diagnosis not present

## 2023-09-03 LAB — COMPREHENSIVE METABOLIC PANEL WITH GFR
ALT: 89 U/L — ABNORMAL HIGH (ref 0–44)
AST: 64 U/L — ABNORMAL HIGH (ref 15–41)
Albumin: 2.4 g/dL — ABNORMAL LOW (ref 3.5–5.0)
Alkaline Phosphatase: 89 U/L (ref 38–126)
Anion gap: 6 (ref 5–15)
BUN: 39 mg/dL — ABNORMAL HIGH (ref 8–23)
CO2: 33 mmol/L — ABNORMAL HIGH (ref 22–32)
Calcium: 8.7 mg/dL — ABNORMAL LOW (ref 8.9–10.3)
Chloride: 105 mmol/L (ref 98–111)
Creatinine, Ser: 0.91 mg/dL (ref 0.61–1.24)
GFR, Estimated: >60 mL/min (ref >60)
Glucose, Bld: 104 mg/dL — ABNORMAL HIGH (ref 70–99)
Potassium: 4.7 mmol/L (ref 3.5–5.1)
Sodium: 144 mmol/L (ref 135–145)
Total Bilirubin: 0.8 mg/dL (ref 0.0–1.2)
Total Protein: 5.9 g/dL — ABNORMAL LOW (ref 6.5–8.1)

## 2023-09-03 LAB — CBC
HCT: 34.1 % — ABNORMAL LOW (ref 39.0–52.0)
Hemoglobin: 10.8 g/dL — ABNORMAL LOW (ref 13.0–17.0)
MCH: 26.7 pg (ref 26.0–34.0)
MCHC: 31.7 g/dL (ref 30.0–36.0)
MCV: 84.2 fL (ref 80.0–100.0)
Platelets: 311 K/uL (ref 150–400)
RBC: 4.05 MIL/uL — ABNORMAL LOW (ref 4.22–5.81)
RDW: 18.2 % — ABNORMAL HIGH (ref 11.5–15.5)
WBC: 19.7 K/uL — ABNORMAL HIGH (ref 4.0–10.5)
nRBC: 0 % (ref 0.0–0.2)

## 2023-09-03 MED ORDER — SALINE SPRAY 0.65 % NA SOLN
1.0000 | NASAL | Status: DC | PRN
  Administered 2023-09-03: 1 via NASAL
  Filled 2023-09-03: qty 44

## 2023-09-03 NOTE — Plan of Care (Signed)

## 2023-09-03 NOTE — Progress Notes (Signed)
 PROGRESS NOTE    [name]  FMW:[id number] DOB: [birth year] DOA: 08/27/2023 PCP: Paul Frederick, DO   Brief Narrative:  63yo male smoker with hx HTN, COPD, PAF on eliquis , NICM, HFpEF presented 7/14 with 1 week progressive SOB, cough, malaise, poor appetite. On EMS arrival with hypoxic with sats 80% on RA. In ER initially BP was soft with SBP 80's, tachy 130's but responded well to 1L fluids. CXR revealed dense RUL PNA. He was admitted by [facility], started on IV rocephin /azithro. On 7/15 he was still boarding in ER and again having hypotension with SBP 80's and went into septic shock for which PCCM consulted and patient transitioned under critical care team.  Eventually off of vasopressors and oxygen demand improved so he was transitioned back into [facility] care on 09/02/2023.    Assessment & Plan:   Principal Problem:   Acute sepsis (HCC)  Septic shock and acute respiratory failure with hypoxia secondary to right upper lobe pneumonia: Severe sepsis and acute respiratory failure with hypoxia present at admission.  Patient developed septic shock next day of the admission.  Initially was on Rocephin  and Zithromax , antibiotics were transitioned to Zosyn .  Patient required vasopressors and has been off of vasopressor since few days.  Urine antigen for Legionella and streptococci negative.  COVID, RSV and flu negative.  Respiratory viral panel negative.  AFB negative.  Lactic acidosis resolved.  Respiratory culture growing Streptococcus constellatus.  Patient on Zosyn .  Oxygen demand down to 25% high flow, however he had taken his oxygen completely off when I saw him and his oxygen saturation was 84% and he was fully comfortable, makes me think that we can wean him further down, I sent a message to the nurse.  Continue incentive spirometry and flutter valve.  Continue bronchodilators and Solu-Medrol .  Will need repeat CT scan in 4 to 6 weeks.  Leukocytosis improving.   Elevated troponin: Slightly elevated and  flat, echo ruled out WMA.  Likely demand ischemia.  AKI due to septic ATN: Creatinine improved.   History of paroxysmal A-fib with RVR- Continue home dose amiodarone , Eliquis  therapy.  Overnight was started on metoprolol  25 mg p.o. twice daily.  Rate is now better controlled.   Dysphagia: Seen by SLP.  Currently on dysphagia 3 diet.  Hyperlipidemia: Continue statin.  DVT prophylaxis: Eliquis    Code Status: Full Code  Family Communication:  None present at bedside.  Plan of care discussed with patient in length and he/she verbalized understanding and agreed with it.  Status is: Inpatient Remains inpatient appropriate because: Still on high flow oxygen    Estimated body mass index is 26.31 kg/m as calculated from the following:   Height as of this encounter: 5' 9 (1.753 m).   Weight as of this encounter: 80.8 kg.    Nutritional Assessment: Body mass index is 26.31 kg/m.[name] Seen by dietician.  I agree with the assessment and plan as outlined below: Nutrition Status:        . Skin Assessment: I have examined the patient's skin and I agree with the wound assessment as performed by the wound care RN as outlined below:    Consultants:  None  Procedures:  As above  Antimicrobials:  Anti-infectives (From admission, onward)    Start     Dose/Rate Route Frequency Ordered Stop   08/29/23 1015  piperacillin -tazobactam (ZOSYN ) IVPB 3.375 g        3.375 g 12.5 mL/hr over 240 Minutes Intravenous Every 8 hours 08/29/23 9077  08/29/23 1000  doxycycline  (VIBRAMYCIN ) 100 mg in sodium chloride  0.9 % 250 mL IVPB  Status:  Discontinued        100 mg 125 mL/hr over 120 Minutes Intravenous Every 12 hours 08/29/23 0917 08/31/23 1136   08/28/23 1000  azithromycin  (ZITHROMAX ) 500 mg in sodium chloride  0.9 % 250 mL IVPB  Status:  Discontinued        500 mg 250 mL/hr over 60 Minutes Intravenous Every 24 hours 08/27/23 2337 08/29/23 0917   08/28/23 1000  cefTRIAXone  (ROCEPHIN ) 2 g in  sodium chloride  0.9 % 100 mL IVPB  Status:  Discontinued        2 g 200 mL/hr over 30 Minutes Intravenous Every 24 hours 08/27/23 2337 08/29/23 0917   08/27/23 1545  cefTRIAXone  (ROCEPHIN ) 1 g in sodium chloride  0.9 % 100 mL IVPB        1 g 200 mL/hr over 30 Minutes Intravenous  Once 08/27/23 1541 08/27/23 1716   08/27/23 1545  azithromycin  (ZITHROMAX ) 500 mg in sodium chloride  0.9 % 250 mL IVPB        500 mg 250 mL/hr over 60 Minutes Intravenous  Once 08/27/23 1541 08/27/23 1746         Subjective: Seen and examined, sitting in the recliner.  He has no complaints.  Objective: Vitals:   09/03/23 0245 09/03/23 0301 09/03/23 0803 09/03/23 0826  BP: 136/70   124/69  Pulse: (!) 46 (!) 44 (!) 50 (!) 50  Resp: 20   20  Temp: 98.1 F (36.7 C)   97.6 F (36.4 C)  TempSrc: Oral   Oral  SpO2: 94%   (!) 89%  Weight:      Height:        Intake/Output Summary (Last 24 hours) at 09/03/2023 0835 Last data filed at 09/03/2023 0454 Gross per 24 hour  Intake 1167.63 ml  Output 1325 ml  Net -157.37 ml   Filed Weights   08/28/23 1716 08/30/23 0255  Weight: 77.8 kg 80.8 kg    Examination:  General exam: Appears calm and comfortable  Respiratory system: Diminished breath sounds.  Respiratory effort normal. Cardiovascular system: S1 & S2 heard, RRR. No JVD, murmurs, rubs, gallops or clicks. No pedal edema. Gastrointestinal system: Abdomen is nondistended, soft and nontender. No organomegaly or masses felt. Normal bowel sounds heard. Central nervous system: Alert and oriented. No focal neurological deficits. Extremities: Symmetric 5 x 5 power. Skin: No rashes, lesions or ulcers Psychiatry: Judgement and insight appear normal. Mood & affect appropriate.    Data Reviewed: I have personally reviewed following labs and imaging studies  CBC: Recent Labs  Lab 08/27/23 1518 08/27/23 1551 08/30/23 0840 08/31/23 0405 09/01/23 0436 09/02/23 0400 09/03/23 0427  WBC 35.2*   < > 24.7*  18.2* 27.5* 29.9* 19.7*  NEUTROABS 31.3*  --   --   --   --   --   --   HGB 13.6   < > 10.3* 9.7* 11.0* 10.7* 10.8*  HCT 41.3   < > 32.4* 30.7* 34.1* 32.4* 34.1*  MCV 80.8   < > 83.1 83.9 82.6 81.4 84.2  PLT 570*   < > 359 311 349 361 311   < > = values in this interval not displayed.   Basic Metabolic Panel: Recent Labs  Lab 08/27/23 1537 08/27/23 1551 08/28/23 0605 08/29/23 0744 08/30/23 0840 08/31/23 0405 09/01/23 0436 09/02/23 0400 09/03/23 0427  NA  --    < > 138 137 144 143  142 142 144  K  --    < > 4.0 3.7 3.8 4.2 4.1 4.3 4.7  CL  --   --  100 100 107 103 104 103 105  CO2  --   --  24 22 28  32 31 31 33*  GLUCOSE  --   --  134* 142* 120* 164* 175* 115* 104*  BUN  --   --  75* 73* 53* 48* 44* 44* 39*  CREATININE  --   --  1.80* 1.41* 0.89 1.51* 1.03 0.90 0.91  CALCIUM   --   --  8.1* 7.9* 8.3* 8.5* 8.5* 8.6* 8.7*  MG 2.9*  --  2.9*  --   --   --   --   --   --   PHOS  --   --  6.0* 4.8*  --   --   --   --   --    < > = values in this interval not displayed.   GFR: Estimated Creatinine Clearance: 83.1 mL/min (by C-G formula based on SCr of 0.91 mg/dL). Liver Function Tests: Recent Labs  Lab 08/28/23 0605 08/29/23 0744 08/31/23 0405 09/01/23 0436 09/02/23 0400 09/03/23 0427  AST 57*  --  31 38 76* 64*  ALT 31  --  22 32 81* 89*  ALKPHOS 103  --  75 80 93 89  BILITOT 1.3*  --  0.7 0.9 0.8 0.8  PROT 6.2*  --  5.9* 5.9* 5.8* 5.9*  ALBUMIN  1.6* 1.5* 3.2* 2.6* 2.6* 2.4*   No results for input(s): LIPASE, AMYLASE in the last 168 hours. No results for input(s): AMMONIA in the last 168 hours. Coagulation Profile: Recent Labs  Lab 08/27/23 1518  INR 1.5*   Cardiac Enzymes: Recent Labs  Lab 08/30/23 0922  CKTOTAL 118   BNP (last 3 results) No results for input(s): PROBNP in the last 8760 hours. HbA1C: No results for input(s): HGBA1C in the last 72 hours.  CBG: Recent Labs  Lab 09/01/23 1201 09/01/23 1626  GLUCAP 166* 176*   Lipid  Profile: No results for input(s): CHOL, HDL, LDLCALC, TRIG, CHOLHDL, LDLDIRECT in the last 72 hours. Thyroid Function Tests: No results for input(s): TSH, T4TOTAL, FREET4, T3FREE, THYROIDAB in the last 72 hours. Anemia Panel: No results for input(s): VITAMINB12, FOLATE, FERRITIN, TIBC, IRON, RETICCTPCT in the last 72 hours.  Sepsis Labs: Recent Labs  Lab 08/27/23 1552 08/27/23 2002 08/28/23 0605 08/28/23 1555 08/29/23 0744 08/30/23 0304  PROCALCITON  --   --  3.28  --  1.84 1.15  LATICACIDVEN 2.4* 2.4* 1.4 1.6  --   --     Recent Results (from the past 240 hours)  Culture, blood (routine x 2)     Status: None   Collection Time: 08/27/23  1:42 PM   Specimen: BLOOD  Result Value Ref Range Status   Specimen Description BLOOD LEFT ANTECUBITAL  Final   Special Requests   Final    BOTTLES DRAWN AEROBIC AND ANAEROBIC Blood Culture results may not be optimal due to an inadequate volume of blood received in culture bottles   Culture   Final    NO GROWTH 5 DAYS Performed at [facility], [address], [place], [place] [address]    Report Status 09/01/2023 FINAL  Final  Acid Fast Smear (AFB)     Status: None   Collection Time: 08/27/23  2:00 PM   Specimen: Sputum  Result Value Ref Range Status   AFB  Specimen Processing Concentration  Final   Acid Fast Smear Negative  Final    Comment: (NOTE) Performed At: [facility] [address] [place], [place] [id number] Jennette Shorter MD Ey:[id number]    Source (AFB) SPU  Final    Comment: Performed at [facility], [address] [place], [place] [address]  Culture, blood (routine x 2)     Status: None   Collection Time: 08/27/23  3:19 PM   Specimen: BLOOD RIGHT HAND  Result Value Ref Range Status   Specimen Description BLOOD RIGHT HAND  Final   Special Requests   Final    BOTTLES DRAWN AEROBIC AND ANAEROBIC Blood Culture results may not be optimal due to an inadequate volume of  blood received in culture bottles   Culture   Final    NO GROWTH 5 DAYS Performed at [facility], [address], [place], [place] [address]    Report Status 09/01/2023 FINAL  Final  SARS Coronavirus 2 by RT PCR (hospital order, performed in [facility] hospital lab) *cepheid single result test* Anterior Nasal Swab     Status: None   Collection Time: 08/27/23  3:42 PM   Specimen: Anterior Nasal Swab  Result Value Ref Range Status   SARS Coronavirus 2 by RT PCR NEGATIVE NEGATIVE Final    Comment: Performed at [facility], [address] [place], [place] [address]  Expectorated Sputum Assessment w Gram Stain, Rflx to Resp Cult     Status: None   Collection Time: 08/27/23 11:16 PM   Specimen: Sputum  Result Value Ref Range Status   Specimen Description SPU  Final   Special Requests NONE  Final   Sputum evaluation   Final    THIS SPECIMEN IS ACCEPTABLE FOR SPUTUM CULTURE Performed at [facility], [address], [place], [place] [address]    Report Status 08/28/2023 FINAL  Final  Culture, Respiratory w Gram Stain     Status: None   Collection Time: 08/27/23 11:16 PM   Specimen: Sputum  Result Value Ref Range Status   Specimen Description SPU  Final   Special Requests NONE Reflexed from [id number]  Final   Gram Stain   Final    ABUNDANT WBC PRESENT, PREDOMINANTLY PMN ABUNDANT GRAM POSITIVE COCCI FEW GRAM POSITIVE RODS Performed at [facility], [address], [place], [place] [address]    Culture ABUNDANT STREPTOCOCCUS CONSTELLATUS  Final   Report Status 08/31/2023 FINAL  Final   Organism ID, Bacteria STREPTOCOCCUS CONSTELLATUS  Final      Susceptibility   Streptococcus constellatus - MIC*    PENICILLIN INTERMEDIATE Intermediate     CEFTRIAXONE  1 SENSITIVE Sensitive     ERYTHROMYCIN 4 RESISTANT Resistant     LEVOFLOXACIN <=0.25 SENSITIVE Sensitive     VANCOMYCIN 0.5 SENSITIVE Sensitive     * ABUNDANT STREPTOCOCCUS CONSTELLATUS  Expectorated Sputum  Assessment w Gram Stain, Rflx to Resp Cult     Status: None   Collection Time: 08/28/23  1:28 PM   Specimen: Expectorated Sputum  Result Value Ref Range Status   Specimen Description EXPECTORATED SPUTUM  Final   Special Requests NONE  Final   Sputum evaluation   Final    THIS SPECIMEN IS ACCEPTABLE FOR SPUTUM CULTURE Performed at [facility], [address] [place], [place] [address]    Report Status 08/28/2023 FINAL  Final  MTB-RIF NAA with AFB Culture, sputum (q8 x 3)  Status: None (Preliminary result)   Collection Time: 08/28/23  1:28 PM   Specimen: Sputum  Result Value Ref Range Status   Myco tuberculosis Complex NOT DETECTED NOT DETECTED Final   Rifampin Not applicable NOT DETECTED Final   AFB Specimen Processing Concentration  Final    Comment: (NOTE) Performed At: [facility] [address] [place], [place] [id number] Jennette Shorter MD Ey:[id number]    Acid Fast Culture PENDING  Incomplete   Source (MTB RIF) SPU  Final    Comment: Performed at [facility], [address] [place], [place] [address]  Culture, Respiratory w Gram Stain     Status: None   Collection Time: 08/28/23  1:28 PM  Result Value Ref Range Status   Specimen Description EXPECTORATED SPUTUM  Final   Special Requests NONE Reflexed from [id number]  Final   Gram Stain   Final    ABUNDANT WBC PRESENT, PREDOMINANTLY PMN ABUNDANT GRAM POSITIVE COCCI IN PAIRS FEW GRAM POSITIVE RODS RARE GRAM NEGATIVE RODS    Culture   Final    ABUNDANT STREPTOCOCCUS CONSTELLATUS SUSCEPTIBILITIES PERFORMED ON PREVIOUS CULTURE WITHIN THE LAST 5 DAYS. Performed at [facility], [address], [place], [place] [address]    Report Status 08/31/2023 FINAL  Final  MRSA Next Gen by PCR, Nasal     Status: None   Collection Time: 08/28/23  2:57 PM   Specimen: Nasal Mucosa; Nasal Swab  Result Value Ref Range Status   MRSA by PCR Next Gen NOT DETECTED NOT DETECTED Final    Comment: (NOTE) The  GeneXpert MRSA Assay (FDA approved for NASAL specimens only), is one component of a comprehensive MRSA colonization surveillance program. It is not intended to diagnose MRSA infection nor to guide or monitor treatment for MRSA infections. Test performance is not FDA approved in patients less than 6 years old. Performed at [facility], [address], [place], [place] [address]   MTB-RIF NAA with AFB Culture, sputum (q8 x 3)     Status: None (Preliminary result)   Collection Time: 08/28/23  9:28 PM   Specimen: Sputum  Result Value Ref Range Status   Myco tuberculosis Complex NOT DETECTED NOT DETECTED Final   Rifampin Not applicable NOT DETECTED Final   AFB Specimen Processing Concentration  Final    Comment: (NOTE) Performed At: [facility] [address] [place], [place] [id number] Jennette Shorter MD Ey:[id number]    Acid Fast Culture PENDING  Incomplete   Source (MTB RIF) SPU  Final    Comment: Performed at [facility], [address] [place], [place] [address]  MTB-RIF NAA with AFB Culture, sputum (q8 x 3)     Status: None (Preliminary result)   Collection Time: 08/28/23  9:30 PM   Specimen: Sputum  Result Value Ref Range Status   Myco tuberculosis Complex NOT DETECTED NOT DETECTED Final   Rifampin Not applicable NOT DETECTED Final   AFB Specimen Processing Concentration  Final    Comment: (NOTE) Performed At: [facility] [address] [place], [place] [id number] Jennette Shorter MD Ey:[id number]    Acid Fast Culture PENDING  Incomplete   Source (MTB RIF) SPUTUM  Final    Comment: Performed at [facility], [address] [place], [place] [address]     Radiology Studies: No results found.  Scheduled Meds:  amiodarone   200 mg Oral Daily   apixaban   5 mg Oral BID   arformoterol   15  mcg Nebulization BID   atorvastatin   40 mg Oral Daily   Chlorhexidine  Gluconate Cloth  6 each Topical Daily   feeding supplement  237 mL Oral BID BM    metoprolol  tartrate  25 mg Oral BID   nicotine   21 mg Transdermal Daily   mouth rinse  15 mL Mouth Rinse 4 times per day   pantoprazole   80 mg Oral Q1200   revefenacin   175 mcg Nebulization Daily   sodium chloride  flush  10-40 mL Intracatheter Q12H   thiamine   100 mg Oral Daily   Continuous Infusions:  piperacillin -tazobactam (ZOSYN )  IV 3.375 g (09/03/23 0630)     LOS: 7 days   Fredia Skeeter, MD [facility]  09/03/2023, 8:35 AM   *Please note that this is a verbal dictation therefore any spelling or grammatical errors are due to the Dragon Medical One system interpretation.  Please page via Amion and do not message via secure chat for urgent patient care matters. Secure chat can be used for non urgent patient care matters.  How to contact the [facility] Attending or Consulting provider 7A - 7P or covering provider during after hours 7P -7A, for this patient?  Check the care team in [facility] and look for a) attending/consulting [facility] provider listed and b) the [facility] team listed. Page or secure chat 7A-7P. Log into [email or web address] and use [facility] universal password to access. If you do not have the password, please contact the hospital operator. Locate the [facility] provider you are looking for under [facility] and page to a number that you can be directly reached. If you still have difficulty reaching the provider, please page the [facility] (Director on Call) for the Hospitalists listed on amion for assistance.

## 2023-09-03 NOTE — Care Management Important Message (Signed)
 Important Message  Patient Details  Name: [name] MRN: [id number] Date of Birth: 02/25/1960   Important Message Given:  Yes - Medicare IM     Claretta Deed 09/03/2023, 4:28 PM

## 2023-09-04 DIAGNOSIS — A419 Sepsis, unspecified organism: Secondary | ICD-10-CM | POA: Diagnosis not present

## 2023-09-04 LAB — COMPREHENSIVE METABOLIC PANEL WITH GFR
ALT: 156 U/L — ABNORMAL HIGH (ref 0–44)
AST: 143 U/L — ABNORMAL HIGH (ref 15–41)
Albumin: 2.4 g/dL — ABNORMAL LOW (ref 3.5–5.0)
Alkaline Phosphatase: 93 U/L (ref 38–126)
Anion gap: 11 (ref 5–15)
BUN: 40 mg/dL — ABNORMAL HIGH (ref 8–23)
CO2: 27 mmol/L (ref 22–32)
Calcium: 8.5 mg/dL — ABNORMAL LOW (ref 8.9–10.3)
Chloride: 104 mmol/L (ref 98–111)
Creatinine, Ser: 0.96 mg/dL (ref 0.61–1.24)
GFR, Estimated: >60 mL/min (ref >60)
Glucose, Bld: 82 mg/dL (ref 70–99)
Potassium: 4.6 mmol/L (ref 3.5–5.1)
Sodium: 142 mmol/L (ref 135–145)
Total Bilirubin: 0.7 mg/dL (ref 0.0–1.2)
Total Protein: 5.8 g/dL — ABNORMAL LOW (ref 6.5–8.1)

## 2023-09-04 NOTE — Progress Notes (Signed)
 PROGRESS NOTE    Red Mandt  FMW:[id number] DOB: 1960-12-19 DOA: 08/27/2023 PCP: Zena Frederick, DO   Brief Narrative:  63yo male smoker with hx HTN, COPD, PAF on eliquis , NICM, HFpEF presented 7/14 with 1 week progressive SOB, cough, malaise, poor appetite. On EMS arrival with hypoxic with sats 80% on RA. In ER initially BP was soft with SBP 80's, tachy 130's but responded well to 1L fluids. CXR revealed dense RUL PNA. He was admitted by [facility], started on IV rocephin /azithro. On 7/15 he was still boarding in ER and again having hypotension with SBP 80's and went into septic shock for which PCCM consulted and patient transitioned under critical care team.  Eventually off of vasopressors and oxygen demand improved so he was transitioned back into [facility] care on 09/02/2023.    Assessment & Plan:   Principal Problem:   Acute sepsis (HCC)  Septic shock and acute respiratory failure with hypoxia secondary to right upper lobe pneumonia: Severe sepsis and acute respiratory failure with hypoxia present at admission.  Patient developed septic shock next day of the admission.  Initially was on Rocephin  and Zithromax , antibiotics were transitioned to Zosyn .  Patient required vasopressors and has been off of vasopressor since few days.  Urine antigen for Legionella and streptococci negative.  COVID, RSV and flu negative.  Respiratory viral panel negative.  AFB negative.  Lactic acidosis resolved.  Respiratory culture growing Streptococcus constellatus.  Patient on Zosyn .  Oxygen demand down to 15% high flow, continue to encourage incentive spirometry and continue to wean.  Leukocytosis improving.  Repeat CBC in the morning.   Elevated troponin: Slightly elevated and flat, echo ruled out WMA.  Likely demand ischemia.  AKI due to septic ATN: Creatinine improved.   History of paroxysmal A-fib with RVR- Continue home dose amiodarone , Eliquis  therapy.  Overnight was started on metoprolol  25 mg p.o. twice daily.   Rate is now better controlled.   Dysphagia: Seen by SLP.  Currently on dysphagia 3 diet.  Hyperlipidemia: Continue statin.  DVT prophylaxis: Eliquis    Code Status: Full Code  Family Communication:  None present at bedside.  Plan of care discussed with patient in length and he/she verbalized understanding and agreed with it.  Status is: Inpatient Remains inpatient appropriate because: Still on high flow oxygen    Estimated body mass index is 26.31 kg/m as calculated from the following:   Height as of this encounter: 5' 9 (1.753 m).   Weight as of this encounter: 80.8 kg.    Nutritional Assessment: Body mass index is 26.31 kg/m.[name] Seen by dietician.  I agree with the assessment and plan as outlined below: Nutrition Status:        . Skin Assessment: I have examined the patient's skin and I agree with the wound assessment as performed by the wound care RN as outlined below:    Consultants:  None  Procedures:  As above  Antimicrobials:  Anti-infectives (From admission, onward)    Start     Dose/Rate Route Frequency Ordered Stop   08/29/23 1015  piperacillin -tazobactam (ZOSYN ) IVPB 3.375 g        3.375 g 12.5 mL/hr over 240 Minutes Intravenous Every 8 hours 08/29/23 0922 09/04/23 2359   08/29/23 1000  doxycycline  (VIBRAMYCIN ) 100 mg in sodium chloride  0.9 % 250 mL IVPB  Status:  Discontinued        100 mg 125 mL/hr over 120 Minutes Intravenous Every 12 hours 08/29/23 0917 08/31/23 1136   08/28/23 1000  azithromycin  (ZITHROMAX ) 500 mg in sodium chloride  0.9 % 250 mL IVPB  Status:  Discontinued        500 mg 250 mL/hr over 60 Minutes Intravenous Every 24 hours 08/27/23 2337 08/29/23 0917   08/28/23 1000  cefTRIAXone  (ROCEPHIN ) 2 g in sodium chloride  0.9 % 100 mL IVPB  Status:  Discontinued        2 g 200 mL/hr over 30 Minutes Intravenous Every 24 hours 08/27/23 2337 08/29/23 0917   08/27/23 1545  cefTRIAXone  (ROCEPHIN ) 1 g in sodium chloride  0.9 % 100 mL IVPB        1  g 200 mL/hr over 30 Minutes Intravenous  Once 08/27/23 1541 08/27/23 1716   08/27/23 1545  azithromycin  (ZITHROMAX ) 500 mg in sodium chloride  0.9 % 250 mL IVPB        500 mg 250 mL/hr over 60 Minutes Intravenous  Once 08/27/23 1541 08/27/23 1746         Subjective: Seen and examined.  No complaints.  Denies any shortness of breath.  Objective: Vitals:   09/04/23 0127 09/04/23 0324 09/04/23 0731 09/04/23 0831  BP:  (!) 153/69    Pulse: (!) 51 (!) 55  65  Resp:  20    Temp:  97.7 F (36.5 C)    TempSrc:  Oral    SpO2:  95% 93%   Weight:      Height:        Intake/Output Summary (Last 24 hours) at 09/04/2023 0904 Last data filed at 09/04/2023 0325 Gross per 24 hour  Intake 240 ml  Output 1025 ml  Net -785 ml   Filed Weights   08/28/23 1716 08/30/23 0255  Weight: 77.8 kg 80.8 kg    Examination:  General exam: Appears calm and comfortable  Respiratory system: Diminished breath sounds bilaterally. Respiratory effort normal. Cardiovascular system: S1 & S2 heard, RRR. No JVD, murmurs, rubs, gallops or clicks. No pedal edema. Gastrointestinal system: Abdomen is nondistended, soft and nontender. No organomegaly or masses felt. Normal bowel sounds heard. Central nervous system: Alert and oriented. No focal neurological deficits. Extremities: Symmetric 5 x 5 power. Skin: No rashes, lesions or ulcers.  Psychiatry: Judgement and insight appear normal. Mood & affect appropriate.   Data Reviewed: I have personally reviewed following labs and imaging studies  CBC: Recent Labs  Lab 08/30/23 0840 08/31/23 0405 09/01/23 0436 09/02/23 0400 09/03/23 0427  WBC 24.7* 18.2* 27.5* 29.9* 19.7*  HGB 10.3* 9.7* 11.0* 10.7* 10.8*  HCT 32.4* 30.7* 34.1* 32.4* 34.1*  MCV 83.1 83.9 82.6 81.4 84.2  PLT 359 311 349 361 311   Basic Metabolic Panel: Recent Labs  Lab 08/29/23 0744 08/30/23 0840 08/31/23 0405 09/01/23 0436 09/02/23 0400 09/03/23 0427 09/04/23 0340  NA 137   < > 143  142 142 144 142  K 3.7   < > 4.2 4.1 4.3 4.7 4.6  CL 100   < > 103 104 103 105 104  CO2 22   < > 32 31 31 33* 27  GLUCOSE 142*   < > 164* 175* 115* 104* 82  BUN 73*   < > 48* 44* 44* 39* 40*  CREATININE 1.41*   < > 1.51* 1.03 0.90 0.91 0.96  CALCIUM  7.9*   < > 8.5* 8.5* 8.6* 8.7* 8.5*  PHOS 4.8*  --   --   --   --   --   --    < > = values in this interval not displayed.  GFR: Estimated Creatinine Clearance: 78.8 mL/min (by C-G formula based on SCr of 0.96 mg/dL). Liver Function Tests: Recent Labs  Lab 08/31/23 0405 09/01/23 0436 09/02/23 0400 09/03/23 0427 09/04/23 0340  AST 31 38 76* 64* 143*  ALT 22 32 81* 89* 156*  ALKPHOS 75 80 93 89 93  BILITOT 0.7 0.9 0.8 0.8 0.7  PROT 5.9* 5.9* 5.8* 5.9* 5.8*  ALBUMIN  3.2* 2.6* 2.6* 2.4* 2.4*   No results for input(s): LIPASE, AMYLASE in the last 168 hours. No results for input(s): AMMONIA in the last 168 hours. Coagulation Profile: No results for input(s): INR, PROTIME in the last 168 hours.  Cardiac Enzymes: Recent Labs  Lab 08/30/23 0922  CKTOTAL 118   BNP (last 3 results) No results for input(s): PROBNP in the last 8760 hours. HbA1C: No results for input(s): HGBA1C in the last 72 hours.  CBG: Recent Labs  Lab 09/01/23 1201 09/01/23 1626  GLUCAP 166* 176*   Lipid Profile: No results for input(s): CHOL, HDL, LDLCALC, TRIG, CHOLHDL, LDLDIRECT in the last 72 hours. Thyroid Function Tests: No results for input(s): TSH, T4TOTAL, FREET4, T3FREE, THYROIDAB in the last 72 hours. Anemia Panel: No results for input(s): VITAMINB12, FOLATE, FERRITIN, TIBC, IRON, RETICCTPCT in the last 72 hours.  Sepsis Labs: Recent Labs  Lab 08/28/23 1555 08/29/23 0744 08/30/23 0304  PROCALCITON  --  1.84 1.15  LATICACIDVEN 1.6  --   --     Recent Results (from the past 240 hours)  Culture, blood (routine x 2)     Status: None   Collection Time: 08/27/23  1:42 PM   Specimen: BLOOD   Result Value Ref Range Status   Specimen Description BLOOD LEFT ANTECUBITAL  Final   Special Requests   Final    BOTTLES DRAWN AEROBIC AND ANAEROBIC Blood Culture results may not be optimal due to an inadequate volume of blood received in culture bottles   Culture   Final    NO GROWTH 5 DAYS Performed at [facility], [address], [place], [place] [address]    Report Status 09/01/2023 FINAL  Final  Acid Fast Smear (AFB)     Status: None   Collection Time: 08/27/23  2:00 PM   Specimen: Sputum  Result Value Ref Range Status   AFB Specimen Processing Concentration  Final   Acid Fast Smear Negative  Final    Comment: (NOTE) Performed At: [facility] [address] [place], [place] [id number] Jennette Shorter MD Ey:[id number]    Source (AFB) SPU  Final    Comment: Performed at [facility], [address] [place], [place] [address]  Culture, blood (routine x 2)     Status: None   Collection Time: 08/27/23  3:19 PM   Specimen: BLOOD RIGHT HAND  Result Value Ref Range Status   Specimen Description BLOOD RIGHT HAND  Final   Special Requests   Final    BOTTLES DRAWN AEROBIC AND ANAEROBIC Blood Culture results may not be optimal due to an inadequate volume of blood received in culture bottles   Culture   Final    NO GROWTH 5 DAYS Performed at [facility], [address], [place], [place] [address]    Report Status 09/01/2023 FINAL  Final  SARS Coronavirus 2 by RT PCR (hospital order, performed in [facility] hospital lab) *cepheid single result test* Anterior Nasal Swab     Status: None   Collection Time: 08/27/23  3:42 PM  Specimen: Anterior Nasal Swab  Result Value Ref Range Status   SARS Coronavirus 2 by RT PCR NEGATIVE NEGATIVE Final    Comment: Performed at [facility], [address] [place], [place] [address]  Expectorated Sputum Assessment w Gram Stain, Rflx to Resp Cult     Status: None   Collection Time: 08/27/23 11:16 PM   Specimen:  Sputum  Result Value Ref Range Status   Specimen Description SPU  Final   Special Requests NONE  Final   Sputum evaluation   Final    THIS SPECIMEN IS ACCEPTABLE FOR SPUTUM CULTURE Performed at [facility], [address], [place], [place] [address]    Report Status 08/28/2023 FINAL  Final  Culture, Respiratory w Gram Stain     Status: None   Collection Time: 08/27/23 11:16 PM   Specimen: Sputum  Result Value Ref Range Status   Specimen Description SPU  Final   Special Requests NONE Reflexed from [id number]  Final   Gram Stain   Final    ABUNDANT WBC PRESENT, PREDOMINANTLY PMN ABUNDANT GRAM POSITIVE COCCI FEW GRAM POSITIVE RODS Performed at [facility], [address], [place], [place] [address]    Culture ABUNDANT STREPTOCOCCUS CONSTELLATUS  Final   Report Status 08/31/2023 FINAL  Final   Organism ID, Bacteria STREPTOCOCCUS CONSTELLATUS  Final      Susceptibility   Streptococcus constellatus - MIC*    PENICILLIN INTERMEDIATE Intermediate     CEFTRIAXONE  1 SENSITIVE Sensitive     ERYTHROMYCIN 4 RESISTANT Resistant     LEVOFLOXACIN <=0.25 SENSITIVE Sensitive     VANCOMYCIN 0.5 SENSITIVE Sensitive     * ABUNDANT STREPTOCOCCUS CONSTELLATUS  Expectorated Sputum Assessment w Gram Stain, Rflx to Resp Cult     Status: None   Collection Time: 08/28/23  1:28 PM   Specimen: Expectorated Sputum  Result Value Ref Range Status   Specimen Description EXPECTORATED SPUTUM  Final   Special Requests NONE  Final   Sputum evaluation   Final    THIS SPECIMEN IS ACCEPTABLE FOR SPUTUM CULTURE Performed at [facility], [address] [place], [place] [address]    Report Status 08/28/2023 FINAL  Final  MTB-RIF NAA with AFB Culture, sputum (q8 x 3)     Status: None (Preliminary result)   Collection Time: 08/28/23  1:28 PM   Specimen: Sputum  Result Value Ref Range Status   Myco tuberculosis Complex NOT DETECTED NOT DETECTED Final   Rifampin Not applicable NOT DETECTED Final    AFB Specimen Processing Concentration  Final    Comment: (NOTE) Performed At: [facility] [address] [place], [place] [id number] Jennette Shorter MD Ey:[id number]    Acid Fast Culture PENDING  Incomplete   Source (MTB RIF) SPU  Final    Comment: Performed at [facility], [address], [place], [place] [address]  Culture, Respiratory w Gram Stain     Status: None   Collection Time: 08/28/23  1:28 PM  Result Value Ref Range Status   Specimen Description EXPECTORATED SPUTUM  Final   Special Requests NONE Reflexed from [id number]  Final   Gram Stain   Final    ABUNDANT WBC PRESENT, PREDOMINANTLY PMN ABUNDANT GRAM POSITIVE COCCI IN PAIRS FEW GRAM POSITIVE RODS RARE GRAM NEGATIVE RODS    Culture   Final    ABUNDANT STREPTOCOCCUS CONSTELLATUS SUSCEPTIBILITIES PERFORMED ON PREVIOUS CULTURE WITHIN THE LAST 5 DAYS. Performed at [facility]  Lab, [address], [place], [place] [address]    Report Status 08/31/2023 FINAL  Final  MRSA Next Gen by PCR, Nasal     Status: None   Collection Time: 08/28/23  2:57 PM   Specimen: Nasal Mucosa; Nasal Swab  Result Value Ref Range Status   MRSA by PCR Next Gen NOT DETECTED NOT DETECTED Final    Comment: (NOTE) The GeneXpert MRSA Assay (FDA approved for NASAL specimens only), is one component of a comprehensive MRSA colonization surveillance program. It is not intended to diagnose MRSA infection nor to guide or monitor treatment for MRSA infections. Test performance is not FDA approved in patients less than 61 years old. Performed at [facility], [address], [place], [place] [address]   MTB-RIF NAA with AFB Culture, sputum (q8 x 3)     Status: None (Preliminary result)   Collection Time: 08/28/23  9:28 PM   Specimen: Sputum  Result Value Ref Range Status   Myco tuberculosis Complex NOT DETECTED NOT DETECTED Final   Rifampin Not applicable NOT DETECTED Final   AFB Specimen Processing Concentration  Final     Comment: (NOTE) Performed At: [facility] [address] [place], [place] [id number] Jennette Shorter MD Ey:[id number]    Acid Fast Culture PENDING  Incomplete   Source (MTB RIF) SPU  Final    Comment: Performed at [facility] Lab, [address], [place], [place] [address]  MTB-RIF NAA with AFB Culture, sputum (q8 x 3)     Status: None (Preliminary result)   Collection Time: 08/28/23  9:30 PM   Specimen: Sputum  Result Value Ref Range Status   Myco tuberculosis Complex NOT DETECTED NOT DETECTED Final   Rifampin Not applicable NOT DETECTED Final   AFB Specimen Processing Concentration  Final    Comment: (NOTE) Performed At: [facility] [address] [place], [place] [id number] Jennette Shorter MD Ey:[id number]    Acid Fast Culture PENDING  Incomplete   Source (MTB RIF) SPUTUM  Final    Comment: Performed at [facility], [address] [place], [place] [address]     Radiology Studies: No results found.  Scheduled Meds:  amiodarone   200 mg Oral Daily   apixaban   5 mg Oral BID   arformoterol   15 mcg Nebulization BID   atorvastatin   40 mg Oral Daily   Chlorhexidine  Gluconate Cloth  6 each Topical Daily   feeding supplement  237 mL Oral BID BM   metoprolol  tartrate  25 mg Oral BID   nicotine   21 mg Transdermal Daily   mouth rinse  15 mL Mouth Rinse 4 times per day   pantoprazole   80 mg Oral Q1200   revefenacin   175 mcg Nebulization Daily   sodium chloride  flush  10-40 mL Intracatheter Q12H   thiamine   100 mg Oral Daily   Continuous Infusions:  piperacillin -tazobactam (ZOSYN )  IV 3.375 g (09/04/23 0534)     LOS: 8 days   Fredia Skeeter, MD [facility]  09/04/2023, 9:04 AM   *Please note that this is a verbal dictation therefore any spelling or grammatical errors are due to the Dragon Medical One system interpretation.  Please page via Amion and do not message via secure chat for urgent patient care matters. Secure chat can be used for  non urgent patient care matters.  How to contact the [facility] Attending or Consulting provider 7A - 7P or covering provider during after hours 7P -7A, for this  patient?  Check the care team in [facility] and look for a) attending/consulting TRH provider listed and b) the TR[facility] team listed. Page or secure chat 7A-7P. Log into [email or web address] and use [facility]'s universal password to access. If you do not have the password, please contact the hospital operator. Locate the [facility] provider you are looking for under [facility] and page to a number that you can be directly reached. If you still have difficulty reaching the provider, please page the [facility] (Director on Call) for the Hospitalists listed on amion for assistance.

## 2023-09-04 NOTE — Progress Notes (Signed)
 Occupational Therapy Treatment Patient Details Name: [name] MRN: [id number] DOB: 07-May-1960 Today's Date: 09/04/2023   History of present illness Pt is a 63 yr old male who presented 08/27/23 due to SOB, decrease in appetite. Pt the went into septic shock and was transferred into ICU and transferred out on 09/02/23.  PMH: HTN, COPD, PAF on eliquis , NICM, HFpEF   OT comments  Pt reported at PLOF they were getting around their home with no DME but very limited. Pt lives alone and requires 4 steps to enter the home. At this time very limited activity as feeling very weak and dizzy throughout. Pt completed sit to stand transfers with min assist to RW and was only able to stand for less than 1 min and requesting to sit back down. Patient will benefit from continued inpatient follow up therapy, <3 hours/day.   BP sitting: 98/67 (76) Standing: 137/69 (90)      If plan is discharge home, recommend the following:  A lot of help with walking and/or transfers;A lot of help with bathing/dressing/bathroom;Assistance with cooking/housework;Assist for transportation;Help with stairs or ramp for entrance   Equipment Recommendations   (TBD)    Recommendations for Other Services      Precautions / Restrictions Precautions Precautions: Fall Precaution/Restrictions Comments: watch o2 and bp Restrictions Weight Bearing Restrictions Per Provider Order: No       Mobility Bed Mobility               General bed mobility comments: pt presented in chair    Transfers Overall transfer level: Needs assistance Equipment used: Rolling walker (2 wheels) Transfers: Sit to/from Stand Sit to Stand: Min assist                 Balance Overall balance assessment: Needs assistance Sitting-balance support: Feet supported, Bilateral upper extremity supported Sitting balance-[name]: Fair Sitting balance - Comments: fatiges easily when attempting to have pt sit up in chair and reclining  back Postural control: Posterior lean Standing balance support: Bilateral upper extremity supported Standing balance-[name]: Poor Standing balance comment: reliant on RW                           ADL either performed or assessed with clinical judgement   ADL Overall ADL's : Needs assistance/impaired Eating/Feeding: Independent;Sitting   Grooming: Wash/dry face;Set up;Sitting   Upper Body Bathing: Sitting;Minimal assistance   Lower Body Bathing: Moderate assistance;Maximal assistance;Sitting/lateral leans   Upper Body Dressing : Minimal assistance;Sitting   Lower Body Dressing: Moderate assistance;Maximal assistance;Sitting/lateral leans       Toileting- Clothing Manipulation and Hygiene: Maximal assistance;Sit to/from stand              Extremity/Trunk Assessment Upper Extremity Assessment Upper Extremity Assessment: Generalized weakness   Lower Extremity Assessment Lower Extremity Assessment: Defer to PT evaluation   Cervical / Trunk Assessment Cervical / Trunk Assessment: Kyphotic    Vision       Perception     Praxis     Communication Communication Communication: No apparent difficulties   Cognition Arousal: Alert Behavior During Therapy: Flat affect Cognition: No apparent impairments                               Following commands: Intact        Cueing      Exercises      Shoulder Instructions  General Comments      Pertinent Vitals/ Pain       Pain Assessment Pain Assessment: Faces Faces Pain Scale: Hurts even more Facial Expression: Relaxed, neutral Body Movements: Absence of movements Muscle Tension: Relaxed Compliance with ventilator (intubated pts.): N/A Vocalization (extubated pts.): N/A CPOT Total: 0 Pain Location: pt reported pain all over Pain Descriptors / Indicators: Aching Pain Intervention(s): Limited activity within patient's tolerance, Monitored during session, Patient requesting  pain meds-RN notified  Home Living Family/patient expects to be discharged to:: Private residence Living Arrangements: Alone   Type of Home: House Home Access: Stairs to enter Secretary/administrator of Steps: 4 Entrance Stairs-Rails: Right;Left;Can reach both Home Layout: One level     Bathroom Shower/Tub: Tub/shower unit;Curtain         Home Equipment: Cane - single point;Rollator (4 wheels) (toilet riser)          Prior Functioning/Environment              Frequency  Min 2X/week        Progress Toward Goals  OT Goals(current goals can now be found in the care plan section)     Acute Rehab OT Goals Patient Stated Goal: to get better OT Goal Formulation: With patient Time For Goal Achievement: 09/18/23 Potential to Achieve Goals: Good  Plan      Co-evaluation                 AM-PAC OT 6 Clicks Daily Activity     Outcome Measure   Help from another person eating meals?: None Help from another person taking care of personal grooming?: A Little Help from another person toileting, which includes using toliet, bedpan, or urinal?: A Lot Help from another person bathing (including washing, rinsing, drying)?: A Lot Help from another person to put on and taking off regular upper body clothing?: A Little Help from another person to put on and taking off regular lower body clothing?: A Lot 6 Click Score: 16    End of Session Equipment Utilized During Treatment: Gait belt;Rolling walker (2 wheels)  OT Visit Diagnosis: Unsteadiness on feet (R26.81);Other abnormalities of gait and mobility (R26.89);Repeated falls (R29.6);Muscle weakness (generalized) (M62.81);History of falling (Z91.81);Pain Pain - part of body:  (general)   Activity Tolerance Patient limited by fatigue   Patient Left in chair;with call bell/phone within reach   Nurse Communication Mobility status;Other (comment) (pain)        Time: 1140-1210 OT Time Calculation (min): 30  min  Charges: OT General Charges $OT Visit: 1 Visit OT Evaluation $OT Eval Low Complexity: 1 Low OT Treatments $Self Care/Home Management : 8-22 mins  Warrick POUR OTR/L  Acute Rehab Services  [phone] office number   Warrick Berber 09/04/2023, 12:15 PM

## 2023-09-04 NOTE — TOC Progression Note (Signed)
 Transition of Care [facility]) - Progression Note    Patient Details  Name: [name] MRN: [id number] Date of Birth: 11/26/1960  Transition [facility] CM/SW Contact  Nola Devere Hands, RN Phone Number: 09/04/2023, 11:37 AM  Clinical Narrative:    63 yr old male from home, sepsis. Patient still requires oxygen via HFNC@15L  Fico2 45%. TOC Team continues to follow.    Expected Discharge Plan: Skilled Nursing Facility Barriers to Discharge: Continued Medical Work up  Expected Discharge Plan and Services In-house Referral: NA Discharge Planning Services: CM Consult   Living arrangements for the past 2 months: Mobile Home                   DME Agency: NA                   Social Determinants of Health (SDOH) Interventions SDOH Screenings   Food Insecurity: No Food Insecurity (08/28/2023)  Housing: Low Risk  (08/28/2023)  Transportation Needs: No Transportation Needs (08/28/2023)  Utilities: Not At Risk (08/28/2023)  Tobacco Use: High Risk (08/27/2023)    Readmission Risk Interventions    08/30/2023    1:18 PM  Readmission Risk Prevention Plan  Transportation Screening Complete  HRI or Home Care Consult Complete  Social Work Consult for Recovery Care Planning/Counseling Complete  Palliative Care Screening Not Applicable  Medication Review Oceanographer) Referral to Pharmacy

## 2023-09-05 DIAGNOSIS — A419 Sepsis, unspecified organism: Secondary | ICD-10-CM | POA: Diagnosis not present

## 2023-09-05 NOTE — Plan of Care (Signed)
  Problem: Health Behavior/Discharge Planning: Goal: Ability to manage health-related needs will improve Outcome: Progressing   Problem: Clinical Measurements: Goal: Will remain free from infection Outcome: Progressing Goal: Respiratory complications will improve Outcome: Progressing Goal: Cardiovascular complication will be avoided Outcome: Progressing   Problem: Nutrition: Goal: Adequate nutrition will be maintained Outcome: Progressing   Problem: Coping: Goal: Level of anxiety will decrease Outcome: Progressing

## 2023-09-05 NOTE — Evaluation (Signed)
 Physical Therapy Evaluation Patient Details Name: [name] MRN: [id number] DOB: 02/06/1961 Today's Date: 09/05/2023  History of Present Illness  Pt is a 63 yr old male who presented 08/27/23 due to SOB, decrease in appetite. Pt the went into septic shock and was transferred into ICU and transferred out on 09/02/23.  PMH: HTN, COPD, PAF on eliquis , NICM, HFpEF  Clinical Impression  Pt is presenting below baseline level of functioning. Currently pt is requiring CGA for sit to stand and transfers. Pt was able to take steps from recliner to [facility] but limited due to bowel urgency this session. O2 sats remained in the 90's on 8 L during activity. Due to pt current functional status, home set up and available assistance at home recommending skilled physical therapy services 3x/week in order to address strength, balance and functional mobility to decrease risk for falls, injury and re-hospitalization.           If plan is discharge home, recommend the following: A little help with walking and/or transfers;Help with stairs or ramp for entrance;Assistance with cooking/housework;Assist for transportation     Equipment Recommendations BSC/3in1     Functional Status Assessment Patient has had a recent decline in their functional status and demonstrates the ability to make significant improvements in function in a reasonable and predictable amount of time.     Precautions / Restrictions Precautions Precautions: Fall Precaution/Restrictions Comments: watch o2 and bp Restrictions Weight Bearing Restrictions Per Provider Order: No      Mobility  Bed Mobility   General bed mobility comments: Pt in recliner on arrival and departure. Pt sleeps in recliner at home sometimes.    Transfers Overall transfer level: Needs assistance Equipment used: Rolling walker (2 wheels) Transfers: Sit to/from Stand, Bed to chair/wheelchair/BSC Sit to Stand: Contact guard assist   Step pivot transfers: Contact  guard assist       General transfer comment: Pt requires steadying assist on first standing due to instability. Pt assisted to [facility]    Ambulation/Gait Ambulation/Gait assistance: Contact guard assist Gait Distance (Feet): 2 Feet Assistive device: Rolling walker (2 wheels) Gait Pattern/deviations: Step-to pattern, Decreased step length - right, Decreased step length - left Gait velocity: decreased Gait velocity interpretation: <1.31 ft/sec, indicative of household ambulator Pre-gait activities: steps from recliner to [facility] Gait Details: short flat foot step to gait pattern stepping from reciner to [facility]      Balance Overall balance assessment: Needs assistance Sitting-balance support: Feet supported, Bilateral upper extremity supported Sitting balance-[name]: Fair     Standing balance support: Bilateral upper extremity supported, During functional activity, Reliant on assistive device for balance   Standing balance comment: reliant on RW       Pertinent Vitals/Pain Pain Assessment Pain Assessment: 0-10 Pain Score: 10-Worst pain ever Pain Location: Hips and back Pain Descriptors / Indicators: Aching Pain Intervention(s): Limited activity within patient's tolerance, Monitored during session, Patient requesting pain meds-RN notified    Home Living Family/patient expects to be discharged to:: Private residence Living Arrangements: Alone Available Help at Discharge:  (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 ADLs Comments: indep     Extremity/Trunk Assessment    Upper Extremity Assessment Upper Extremity Assessment: Defer to OT evaluation    Lower Extremity Assessment Lower Extremity Assessment: Generalized weakness    Cervical / Trunk Assessment Cervical / Trunk Assessment: Kyphotic  Communication   Communication Communication: No apparent difficulties    Cognition Arousal: Alert Behavior During Therapy: Flat affect   PT - Cognitive impairments: No apparent impairments       Following commands: Intact       Cueing Cueing Techniques: Verbal cues     General Comments General comments (skin integrity, edema, etc.): Pt left sitting on BSC on pt request with call bell.        Assessment/Plan    PT Assessment Patient needs continued PT services  PT Problem List Decreased strength;Decreased balance;Cardiopulmonary status limiting activity;Decreased mobility;Decreased activity tolerance;Decreased safety awareness;Pain       PT Treatment Interventions DME instruction;Therapeutic activities;Gait training;Therapeutic exercise;Stair training;Balance training;Functional mobility training;Patient/family education    PT Goals (Current goals can be found in the Care Plan section)  Acute Rehab PT Goals Patient Stated Goal: to go home PT Goal Formulation: With patient Time For Goal Achievement: 09/19/23 Potential to Achieve Goals: Fair    Frequency Min 2X/week        AM-PAC PT 6 Clicks Mobility  Outcome Measure Help needed turning from your back to your side while in a flat bed without using bedrails?: A Little Help needed moving from lying on your back to sitting on the side of a flat bed without using bedrails?: A Little Help needed moving to and from a bed to a chair (including a wheelchair)?: A Little Help needed standing up from a chair using your arms (e.g., wheelchair or bedside chair)?: A Little Help needed to walk in hospital room?: A Little Help needed climbing 3-5 steps with a railing? : A Lot 6 Click Score: 17     End of Session Equipment Utilized During Treatment: Gait belt Activity Tolerance: Patient tolerated treatment well Patient left: Other (comment) (on BSC with call bell) Nurse Communication: Mobility status PT Visit Diagnosis: Unsteadiness on feet (R26.81);Other abnormalities of gait and mobility (R26.89);Muscle weakness (generalized) (M62.81)    Time: 1203-1221 PT Time Calculation (min) (ACUTE ONLY): 18 min   Charges:   PT Evaluation $PT Eval Low Complexity: 1 Low   PT General Charges $$ ACUTE PT VISIT: 1 Visit        Dorothyann Maier, DPT, CLT  Acute Rehabilitation Services Office: [phone] (Secure chat preferred)   Dorothyann VEAR Maier 09/05/2023, 12:22 PM

## 2023-09-05 NOTE — Progress Notes (Signed)
 PROGRESS NOTE    [name]  FMW:[id number] DOB: Apr 22, 1960 DOA: 08/27/2023 PCP: Zena Frederick, DO   Brief Narrative:  63yo male smoker with hx HTN, COPD, PAF on eliquis , NICM, HFpEF presented 7/14 with 1 week progressive SOB, cough, malaise, poor appetite. On EMS arrival with hypoxic with sats 80% on RA. In ER initially BP was soft with SBP 80's, tachy 130's but responded well to 1L fluids. CXR revealed dense RUL PNA. He was admitted by [facility], started on IV rocephin /azithro. On 7/15 he was still boarding in ER and again having hypotension with SBP 80's and went into septic shock for which PCCM consulted and patient transitioned under critical care team.  Eventually off of vasopressors and oxygen demand improved so he was transitioned back into [facility] care on 09/02/2023.    Assessment & Plan:   Principal Problem:   Acute sepsis (HCC)  Septic shock and acute respiratory failure with hypoxia secondary to right upper lobe pneumonia: Severe sepsis and acute respiratory failure with hypoxia present at admission.  Patient developed septic shock next day of the admission.  Initially was on Rocephin  and Zithromax , antibiotics were transitioned to Zosyn  and he completed course for that as well.  Patient required vasopressors and has been off of vasopressor since few days.  Urine antigen for Legionella and streptococci negative.  COVID, RSV and flu negative.  Respiratory viral panel negative.  AFB negative.  Lactic acidosis resolved.  Respiratory culture growing Streptococcus constellatus.  Patient on Zosyn .  Hypoxia improving, currently requiring only 8 L of high flow oxygen.  Continue to encourage incentive spirometry and continue to wean.  Leukocytosis improving.  Repeat CBC in the morning.   Elevated troponin: Slightly elevated and flat, echo ruled out WMA.  Likely demand ischemia.  AKI due to septic ATN: Creatinine improved.   History of paroxysmal A-fib with RVR- Continue home dose amiodarone , Eliquis   therapy.  Overnight was started on metoprolol  25 mg p.o. twice daily.  Rate is now better controlled.   Dysphagia: Seen by SLP.  Currently on dysphagia 3 diet.  Hyperlipidemia: Continue statin.  DVT prophylaxis: Eliquis    Code Status: Full Code  Family Communication:  None present at bedside.  Plan of care discussed with patient in length and he/she verbalized understanding and agreed with it.  Status is: Inpatient Remains inpatient appropriate because: Still on high flow oxygen    Estimated body mass index is 26.31 kg/m as calculated from the following:   Height as of this encounter: 5' 9 (1.753 m).   Weight as of this encounter: 80.8 kg.    Nutritional Assessment: Body mass index is 26.31 kg/m.[name] Seen by dietician.  I agree with the assessment and plan as outlined below: Nutrition Status:        . Skin Assessment: I have examined the patient's skin and I agree with the wound assessment as performed by the wound care RN as outlined below:    Consultants:  None  Procedures:  As above  Antimicrobials:  Anti-infectives (From admission, onward)    Start     Dose/Rate Route Frequency Ordered Stop   08/29/23 1015  piperacillin -tazobactam (ZOSYN ) IVPB 3.375 g        3.375 g 12.5 mL/hr over 240 Minutes Intravenous Every 8 hours 08/29/23 0922 09/04/23 2359   08/29/23 1000  doxycycline  (VIBRAMYCIN ) 100 mg in sodium chloride  0.9 % 250 mL IVPB  Status:  Discontinued        100 mg 125 mL/hr over 120 Minutes  Intravenous Every 12 hours 08/29/23 0917 08/31/23 1136   08/28/23 1000  azithromycin  (ZITHROMAX ) 500 mg in sodium chloride  0.9 % 250 mL IVPB  Status:  Discontinued        500 mg 250 mL/hr over 60 Minutes Intravenous Every 24 hours 08/27/23 2337 08/29/23 0917   08/28/23 1000  cefTRIAXone  (ROCEPHIN ) 2 g in sodium chloride  0.9 % 100 mL IVPB  Status:  Discontinued        2 g 200 mL/hr over 30 Minutes Intravenous Every 24 hours 08/27/23 2337 08/29/23 0917   08/27/23 1545   cefTRIAXone  (ROCEPHIN ) 1 g in sodium chloride  0.9 % 100 mL IVPB        1 g 200 mL/hr over 30 Minutes Intravenous  Once 08/27/23 1541 08/27/23 1716   08/27/23 1545  azithromycin  (ZITHROMAX ) 500 mg in sodium chloride  0.9 % 250 mL IVPB        500 mg 250 mL/hr over 60 Minutes Intravenous  Once 08/27/23 1541 08/27/23 1746         Subjective: Patient seen and examined, sister and brother-in-law at the bedside.  Patient continues to improve, feels better and denies any shortness of breath.  Objective: Vitals:   09/04/23 1949 09/04/23 2332 09/05/23 0243 09/05/23 0733  BP: 110/67 127/79 115/76   Pulse: 97 91 95   Resp: 20 20 15    Temp: 98 F (36.7 C) 97.8 F (36.6 C) 98.3 F (36.8 C)   TempSrc: Oral Oral Oral   SpO2: 91% 94% 92% 98%  Weight:      Height:        Intake/Output Summary (Last 24 hours) at 09/05/2023 0808 Last data filed at 09/05/2023 0600 Gross per 24 hour  Intake 480 ml  Output 1100 ml  Net -620 ml   Filed Weights   08/28/23 1716 08/30/23 0255  Weight: 77.8 kg 80.8 kg    Examination:  General exam: Appears calm and comfortable  Respiratory system: Diminished breath sounds bilaterally. Respiratory effort normal. Cardiovascular system: S1 & S2 heard, RRR. No JVD, murmurs, rubs, gallops or clicks. No pedal edema. Gastrointestinal system: Abdomen is nondistended, soft and nontender. No organomegaly or masses felt. Normal bowel sounds heard. Central nervous system: Alert and oriented. No focal neurological deficits. Extremities: Symmetric 5 x 5 power. Skin: No rashes, lesions or ulcers.  Psychiatry: Judgement and insight appear normal. Mood & affect appropriate.   Data Reviewed: I have personally reviewed following labs and imaging studies  CBC: Recent Labs  Lab 08/30/23 0840 08/31/23 0405 09/01/23 0436 09/02/23 0400 09/03/23 0427  WBC 24.7* 18.2* 27.5* 29.9* 19.7*  HGB 10.3* 9.7* 11.0* 10.7* 10.8*  HCT 32.4* 30.7* 34.1* 32.4* 34.1*  MCV 83.1 83.9 82.6  81.4 84.2  PLT 359 311 349 361 311   Basic Metabolic Panel: Recent Labs  Lab 08/31/23 0405 09/01/23 0436 09/02/23 0400 09/03/23 0427 09/04/23 0340  NA 143 142 142 144 142  K 4.2 4.1 4.3 4.7 4.6  CL 103 104 103 105 104  CO2 32 31 31 33* 27  GLUCOSE 164* 175* 115* 104* 82  BUN 48* 44* 44* 39* 40*  CREATININE 1.51* 1.03 0.90 0.91 0.96  CALCIUM  8.5* 8.5* 8.6* 8.7* 8.5*   GFR: Estimated Creatinine Clearance: 78.8 mL/min (by C-G formula based on SCr of 0.96 mg/dL). Liver Function Tests: Recent Labs  Lab 08/31/23 0405 09/01/23 0436 09/02/23 0400 09/03/23 0427 09/04/23 0340  AST 31 38 76* 64* 143*  ALT 22 32 81* 89* 156*  ALKPHOS  75 80 93 89 93  BILITOT 0.7 0.9 0.8 0.8 0.7  PROT 5.9* 5.9* 5.8* 5.9* 5.8*  ALBUMIN  3.2* 2.6* 2.6* 2.4* 2.4*   No results for input(s): LIPASE, AMYLASE in the last 168 hours. No results for input(s): AMMONIA in the last 168 hours. Coagulation Profile: No results for input(s): INR, PROTIME in the last 168 hours.  Cardiac Enzymes: Recent Labs  Lab 08/30/23 0922  CKTOTAL 118   BNP (last 3 results) No results for input(s): PROBNP in the last 8760 hours. HbA1C: No results for input(s): HGBA1C in the last 72 hours.  CBG: Recent Labs  Lab 09/01/23 1201 09/01/23 1626  GLUCAP 166* 176*   Lipid Profile: No results for input(s): CHOL, HDL, LDLCALC, TRIG, CHOLHDL, LDLDIRECT in the last 72 hours. Thyroid Function Tests: No results for input(s): TSH, T4TOTAL, FREET4, T3FREE, THYROIDAB in the last 72 hours. Anemia Panel: No results for input(s): VITAMINB12, FOLATE, FERRITIN, TIBC, IRON, RETICCTPCT in the last 72 hours.  Sepsis Labs: Recent Labs  Lab 08/30/23 0304  PROCALCITON 1.15    Recent Results (from the past 240 hours)  Culture, blood (routine x 2)     Status: None   Collection Time: 08/27/23  1:42 PM   Specimen: BLOOD  Result Value Ref Range Status   Specimen Description BLOOD LEFT  ANTECUBITAL  Final   Special Requests   Final    BOTTLES DRAWN AEROBIC AND ANAEROBIC Blood Culture results may not be optimal due to an inadequate volume of blood received in culture bottles   Culture   Final    NO GROWTH 5 DAYS Performed at [facility], [address] [place], [place] [address]    Report Status 09/01/2023 FINAL  Final  Acid Fast Smear (AFB)     Status: None   Collection Time: 08/27/23  2:00 PM   Specimen: Sputum  Result Value Ref Range Status   AFB Specimen Processing Concentration  Final   Acid Fast Smear Negative  Final    Comment: (NOTE) Performed At: [facility] [address] [place], [place] [id number] Jennette Shorter MD Ey:[id number]    Source (AFB) SPU  Final    Comment: Performed at [facility], [address] [place], [place] [address]  Culture, blood (routine x 2)     Status: None   Collection Time: 08/27/23  3:19 PM   Specimen: BLOOD RIGHT HAND  Result Value Ref Range Status   Specimen Description BLOOD RIGHT HAND  Final   Special Requests   Final    BOTTLES DRAWN AEROBIC AND ANAEROBIC Blood Culture results may not be optimal due to an inadequate volume of blood received in culture bottles   Culture   Final    NO GROWTH 5 DAYS Performed at [facility], [address], [place], [place] [address]    Report Status 09/01/2023 FINAL  Final  SARS Coronavirus 2 by RT PCR (hospital order, performed in [facility] hospital lab) *cepheid single result test* Anterior Nasal Swab     Status: None   Collection Time: 08/27/23  3:42 PM   Specimen: Anterior Nasal Swab  Result Value Ref Range Status   SARS Coronavirus 2 by RT PCR NEGATIVE NEGATIVE Final    Comment: Performed at [facility] Lab, [address] [place], [place] [address]  Expectorated Sputum Assessment w Gram Stain, Rflx to Resp Cult     Status: None   Collection Time: 08/27/23 11:16 PM   Specimen:  Sputum  Result Value Ref Range Status   Specimen Description SPU   Final   Special Requests NONE  Final   Sputum evaluation   Final    THIS SPECIMEN IS ACCEPTABLE FOR SPUTUM CULTURE Performed at [facility], [address], [place], [place] [address]    Report Status 08/28/2023 FINAL  Final  Culture, Respiratory w Gram Stain     Status: None   Collection Time: 08/27/23 11:16 PM   Specimen: Sputum  Result Value Ref Range Status   Specimen Description SPU  Final   Special Requests NONE Reflexed from [id number]  Final   Gram Stain   Final    ABUNDANT WBC PRESENT, PREDOMINANTLY PMN ABUNDANT GRAM POSITIVE COCCI FEW GRAM POSITIVE RODS Performed at [facility], [address], [place], [place] [address]    Culture ABUNDANT STREPTOCOCCUS CONSTELLATUS  Final   Report Status 08/31/2023 FINAL  Final   Organism ID, Bacteria STREPTOCOCCUS CONSTELLATUS  Final      Susceptibility   Streptococcus constellatus - MIC*    PENICILLIN INTERMEDIATE Intermediate     CEFTRIAXONE  1 SENSITIVE Sensitive     ERYTHROMYCIN 4 RESISTANT Resistant     LEVOFLOXACIN <=0.25 SENSITIVE Sensitive     VANCOMYCIN 0.5 SENSITIVE Sensitive     * ABUNDANT STREPTOCOCCUS CONSTELLATUS  Expectorated Sputum Assessment w Gram Stain, Rflx to Resp Cult     Status: None   Collection Time: 08/28/23  1:28 PM   Specimen: Expectorated Sputum  Result Value Ref Range Status   Specimen Description EXPECTORATED SPUTUM  Final   Special Requests NONE  Final   Sputum evaluation   Final    THIS SPECIMEN IS ACCEPTABLE FOR SPUTUM CULTURE Performed at [facility], [address] [place], [place] [address]    Report Status 08/28/2023 FINAL  Final  MTB-RIF NAA with AFB Culture, sputum (q8 x 3)     Status: None (Preliminary result)   Collection Time: 08/28/23  1:28 PM   Specimen: Sputum  Result Value Ref Range Status   Myco tuberculosis Complex NOT DETECTED NOT DETECTED Final   Rifampin Not applicable NOT DETECTED Final   AFB Specimen Processing Concentration  Final    Comment:  (NOTE) Performed At: [facility] [address] [place], [place] [id number] Jennette Shorter MD Ey:[id number]    Acid Fast Culture PENDING  Incomplete   Source (MTB RIF) SPU  Final    Comment: Performed at [facility] Lab, [address] [place], [place] [address]  Culture, Respiratory w Gram Stain     Status: None   Collection Time: 08/28/23  1:28 PM  Result Value Ref Range Status   Specimen Description EXPECTORATED SPUTUM  Final   Special Requests NONE Reflexed from [id number]  Final   Gram Stain   Final    ABUNDANT WBC PRESENT, PREDOMINANTLY PMN ABUNDANT GRAM POSITIVE COCCI IN PAIRS FEW GRAM POSITIVE RODS RARE GRAM NEGATIVE RODS    Culture   Final    ABUNDANT STREPTOCOCCUS CONSTELLATUS SUSCEPTIBILITIES PERFORMED ON PREVIOUS CULTURE WITHIN THE LAST 5 DAYS. Performed at [facility], [address] [place], [place] [address]    Report Status 08/31/2023 FINAL  Final  MRSA Next Gen by PCR, Nasal     Status: None   Collection Time: 08/28/23  2:57 PM   Specimen: Nasal Mucosa; Nasal Swab  Result Value Ref Range Status   MRSA by PCR Next Gen NOT DETECTED NOT DETECTED Final  Comment: (NOTE) The GeneXpert MRSA Assay (FDA approved for NASAL specimens only), is one component of a comprehensive MRSA colonization surveillance program. It is not intended to diagnose MRSA infection nor to guide or monitor treatment for MRSA infections. Test performance is not FDA approved in patients less than 64 years old. Performed at [facility], [address] [place], [place] [address]   MTB-RIF NAA with AFB Culture, sputum (q8 x 3)     Status: None (Preliminary result)   Collection Time: 08/28/23  9:28 PM   Specimen: Sputum  Result Value Ref Range Status   Myco tuberculosis Complex NOT DETECTED NOT DETECTED Final   Rifampin Not applicable NOT DETECTED Final   AFB Specimen Processing Concentration  Final    Comment: (NOTE) Performed At: [facility] [address] [place], [place] [id number] Jennette Shorter MD Ey:[id number]    Acid Fast Culture PENDING  Incomplete   Source (MTB RIF) SPU  Final    Comment: Performed at [facility] Lab, [address], [place], [place] [address]  MTB-RIF NAA with AFB Culture, sputum (q8 x 3)     Status: None (Preliminary result)   Collection Time: 08/28/23  9:30 PM   Specimen: Sputum  Result Value Ref Range Status   Myco tuberculosis Complex NOT DETECTED NOT DETECTED Final   Rifampin Not applicable NOT DETECTED Final   AFB Specimen Processing Concentration  Final    Comment: (NOTE) Performed At: [facility] [address] [place], [place] [id number] Jennette Shorter MD Ey:[id number]    Acid Fast Culture PENDING  Incomplete   Source (MTB RIF) SPUTUM  Final    Comment: Performed at [facility] Lab, [address] [place], [place] [address]     Radiology Studies: No results found.  Scheduled Meds:  amiodarone   200 mg Oral Daily   apixaban   5 mg Oral BID   arformoterol   15 mcg Nebulization BID   atorvastatin   40 mg Oral Daily   Chlorhexidine  Gluconate Cloth  6 each Topical Daily   feeding supplement  237 mL Oral BID BM   metoprolol  tartrate  25 mg Oral BID   nicotine   21 mg Transdermal Daily   mouth rinse  15 mL Mouth Rinse 4 times per day   pantoprazole   80 mg Oral Q1200   revefenacin   175 mcg Nebulization Daily   sodium chloride  flush  10-40 mL Intracatheter Q12H   thiamine   100 mg Oral Daily   Continuous Infusions:     LOS: 9 days   Fredia Skeeter, MD [facility]  09/05/2023, 8:08 AM   *Please note that this is a verbal dictation therefore any spelling or grammatical errors are due to the Dragon Medical One system interpretation.  Please page via Amion and do not message via secure chat for urgent patient care matters. Secure chat can be used for non urgent patient care matters.  How to contact the [facility] Attending or Consulting provider 7A - 7P or covering provider  during after hours 7P -7A, for this patient?  Check the care team in [facility] and look for a) attending/consulting [facility] provider listed and b) the [facility] team listed. Page or secure chat 7A-7P. Log into [email or web address] and use [place] [facility]'s universal password to access. If you do not have the password, please contact the hospital operator. Locate the [facility] provider you are looking for under [facility] and page to a number that you can be directly reached. If you still  have difficulty reaching the provider, please page the [facility] (Director on Call) for the Hospitalists listed on amion for assistance.

## 2023-09-06 ENCOUNTER — Inpatient Hospital Stay (HOSPITAL_COMMUNITY)

## 2023-09-06 DIAGNOSIS — A419 Sepsis, unspecified organism: Secondary | ICD-10-CM | POA: Diagnosis not present

## 2023-09-06 DIAGNOSIS — J9601 Acute respiratory failure with hypoxia: Secondary | ICD-10-CM | POA: Diagnosis not present

## 2023-09-06 DIAGNOSIS — J449 Chronic obstructive pulmonary disease, unspecified: Secondary | ICD-10-CM

## 2023-09-06 DIAGNOSIS — F17210 Nicotine dependence, cigarettes, uncomplicated: Secondary | ICD-10-CM

## 2023-09-06 DIAGNOSIS — J154 Pneumonia due to other streptococci: Secondary | ICD-10-CM | POA: Diagnosis not present

## 2023-09-06 LAB — CBC WITH DIFFERENTIAL/PLATELET
Abs Immature Granulocytes: 0.54 K/uL — ABNORMAL HIGH (ref 0.00–0.07)
Basophils Absolute: 0.1 K/uL (ref 0.0–0.1)
Basophils Relative: 0 %
Eosinophils Absolute: 0.1 K/uL (ref 0.0–0.5)
Eosinophils Relative: 0 %
HCT: 34.7 % — ABNORMAL LOW (ref 39.0–52.0)
Hemoglobin: 10.9 g/dL — ABNORMAL LOW (ref 13.0–17.0)
Immature Granulocytes: 3 %
Lymphocytes Relative: 6 %
Lymphs Abs: 1.1 K/uL (ref 0.7–4.0)
MCH: 26.8 pg (ref 26.0–34.0)
MCHC: 31.4 g/dL (ref 30.0–36.0)
MCV: 85.5 fL (ref 80.0–100.0)
Monocytes Absolute: 1.1 K/uL — ABNORMAL HIGH (ref 0.1–1.0)
Monocytes Relative: 6 %
Neutro Abs: 17.3 K/uL — ABNORMAL HIGH (ref 1.7–7.7)
Neutrophils Relative %: 85 %
Platelets: 294 K/uL (ref 150–400)
RBC: 4.06 MIL/uL — ABNORMAL LOW (ref 4.22–5.81)
RDW: 18.9 % — ABNORMAL HIGH (ref 11.5–15.5)
WBC: 20.2 K/uL — ABNORMAL HIGH (ref 4.0–10.5)
nRBC: 0 % (ref 0.0–0.2)

## 2023-09-06 LAB — BASIC METABOLIC PANEL WITH GFR
Anion gap: 7 (ref 5–15)
BUN: 25 mg/dL — ABNORMAL HIGH (ref 8–23)
CO2: 31 mmol/L (ref 22–32)
Calcium: 8.2 mg/dL — ABNORMAL LOW (ref 8.9–10.3)
Chloride: 106 mmol/L (ref 98–111)
Creatinine, Ser: 0.81 mg/dL (ref 0.61–1.24)
GFR, Estimated: >60 mL/min (ref >60)
Glucose, Bld: 85 mg/dL (ref 70–99)
Potassium: 4.4 mmol/L (ref 3.5–5.1)
Sodium: 144 mmol/L (ref 135–145)

## 2023-09-06 LAB — MRSA NEXT GEN BY PCR, NASAL: MRSA by PCR Next Gen: NOT DETECTED

## 2023-09-06 LAB — PROCALCITONIN: Procalcitonin: 0.11 ng/mL

## 2023-09-06 LAB — BRAIN NATRIURETIC PEPTIDE: B Natriuretic Peptide: 214.2 pg/mL — ABNORMAL HIGH (ref 0.0–100.0)

## 2023-09-06 MED ORDER — SODIUM CHLORIDE 0.9 % IV SOLN
1.0000 g | Freq: Three times a day (TID) | INTRAVENOUS | Status: AC
  Administered 2023-09-06 – 2023-09-12 (×20): 1 g via INTRAVENOUS
  Filled 2023-09-06 (×21): qty 20

## 2023-09-06 MED ORDER — LINEZOLID 600 MG/300ML IV SOLN
600.0000 mg | Freq: Two times a day (BID) | INTRAVENOUS | Status: AC
  Administered 2023-09-06 – 2023-09-13 (×14): 600 mg via INTRAVENOUS
  Filled 2023-09-06 (×14): qty 300

## 2023-09-06 MED ORDER — SULFAMETHOXAZOLE-TRIMETHOPRIM 400-80 MG/5ML IV SOLN
15.0000 mg/kg/d | Freq: Three times a day (TID) | INTRAVENOUS | Status: DC
  Administered 2023-09-06 – 2023-09-09 (×9): 404 mg via INTRAVENOUS
  Filled 2023-09-06 (×11): qty 25.25

## 2023-09-06 NOTE — TOC Progression Note (Addendum)
 Transition of Care [facility]) - Progression Note    Patient Details  Name: [name] MRN: [id number] Date of Birth: 1960/08/11  Transition of Care [facility]) CM/SW Contact  Roxie KANDICE Stain, RN Phone Number: 09/06/2023, 2:14 PM  Clinical Narrative:    Patient is agreeable to home health and BSC. This RNCM offered choice for Home Health, Bobby Barton states he has no preference, RNCM made referral to Amy with [facility], She is able to take referral. TOC will order [facility] closer to discharge. Address, Phone number and PCP verified.   Need home health PT, OT orders   Expected Discharge Plan: Home w Home Health Services Barriers to Discharge: Continued Medical Work up               Expected Discharge Plan and Services In-house Referral: NA Discharge Planning Services: CM Consult Post Acute Care Choice: Durable Medical Equipment, Home Health Living arrangements for the past 2 months: Mobile Home                   DME Agency: NA       HH Arranged: PT, OT HH Agency: [facility] Date [facility] Agency Contacted: 09/06/23 Time HH Agency Contacted: 1414 Representative spoke with at [facility] Agency: Amy   Social Drivers of Health (SDOH) Interventions SDOH Screenings   Food Insecurity: No Food Insecurity (08/28/2023)  Housing: Low Risk  (08/28/2023)  Transportation Needs: No Transportation Needs (08/28/2023)  Utilities: Not At Risk (08/28/2023)  Tobacco Use: High Risk (08/27/2023)    Readmission Risk Interventions    08/30/2023    1:18 PM  Readmission Risk Prevention Plan  Transportation Screening Complete  HRI or Home Care Consult Complete  Social Work Consult for Recovery Care Planning/Counseling Complete  Palliative Care Screening Not Applicable  Medication Review Oceanographer) Referral to Pharmacy

## 2023-09-06 NOTE — Evaluation (Signed)
 Clinical/Bedside Swallow Evaluation Patient Details  Name: [name] MRN: [id number] Date of Birth: 1960/08/15  Today's Date: 09/06/2023 Time: SLP Start Time (ACUTE ONLY): 1416 SLP Stop Time (ACUTE ONLY): 1426 SLP Time Calculation (min) (ACUTE ONLY): 10 min  Past Medical History:  Past Medical History:  Diagnosis Date   Anxiety    Arthritis    Cardiomyopathy (HCC)    CHF (congestive heart failure) (HCC)    COPD (chronic obstructive pulmonary disease) (HCC)    Dysphagia    with esophageal stricture   Dysrhythmia    atrial fib   GERD (gastroesophageal reflux disease)    History of blood transfusion    Hypertension    Past Surgical History:  Past Surgical History:  Procedure Laterality Date   ACETABULAR REVISION Right 05/15/2012   Procedure: ACETABULAR REVISION;  Surgeon: Dempsey LULLA Moan, MD;  Location: [facility];  Service: Orthopedics;  Laterality: Right;  RIGHT HIP BEARING SURFACE VS ACETABULAR REVISION    ESOPHAGOGASTRODUODENOSCOPY     with dilitation   EYE SURGERY Right    cataract extraction with IOL   JOINT REPLACEMENT Bilateral    hips/revision right x 4   HPI:  [name] is a 63 yo male presenting to ED 7/14 with SOB, cough, malaise, and poor appetite x1 week. CXR showed dense RLL PNA. Complicated by septic shock with pressor needs and transfer to ICU 7/15. Transferred back to [facility] 7/20 but with worsening hypoxia on [facility] 7/24. CXR 7/24 shows increased R lung opacity, concerning for worsening R PNA. CCM re-consulted and CT Chest pending. Thought to have a primary esophageal dysphagia at the time of previous SLP evaluation 7/15 and had been scheduled for an OP esophageal dilation 7/28 PTA. PMH includes former EtOH abuse, tobacco abuse, paroxysmal A-fib, chronic HFpEF, nonischemic cardiomyopathy, HTN, HLD, COPD, esophageal dysphagia with history of stricture and dysmotility s/p multiple dilations    Assessment / Plan / Recommendation  Clinical Impression  Pt exhibits  no signs clinically concerning for dysphagia or aspiration with trials of thin liquids and solids. He does endorse a history of esophageal dysmotility and was scheduled for a dilation 7/28 PTA. He states his appetite has been limited secondary to globus sensation. Discussed with MD and given worsening chest imaging, recommend consideration of both an MBS and an esophagram for further assessment. MD hopeful pt's O2 requirements will decrease by early next week so SLP will f/u subsequent date to assess readiness to participate in instrumental testing (MBS is preferable to a FEES to allow visualization of the esophagus). Pending results of these tests, continue current diet as pt states he prefers solids in bite-sized pieces. SLP Visit Diagnosis: Dysphagia, unspecified (R13.10)    Aspiration Risk  Moderate aspiration risk    Diet Recommendation Dysphagia 3 (Mech soft);Thin liquid    Liquid Administration via: Cup;Straw Medication Administration: Whole meds with liquid Supervision: Patient able to self feed Compensations: Slow rate;Small sips/bites Postural Changes: Seated upright at 90 degrees;Remain upright for at least 30 minutes after po intake    Other  Recommendations Recommended Consults: Consider esophageal assessment;Consider GI evaluation Oral Care Recommendations: Oral care QID     Assistance Recommended at Discharge    Functional Status Assessment    Frequency and Duration            Prognosis Prognosis for improved oropharyngeal function: Good Barriers to Reach Goals: Time post onset      Swallow Study   General HPI: [name] is a 63 yo male presenting  to ED 7/14 with SOB, cough, malaise, and poor appetite x1 week. CXR showed dense RLL PNA. Complicated by septic shock with pressor needs and transfer to ICU 7/15. Transferred back to [facility] 7/20 but with worsening hypoxia on [facility] 7/24. CXR 7/24 shows increased R lung opacity, concerning for worsening R PNA. CCM  re-consulted and CT Chest pending. Thought to have a primary esophageal dysphagia at the time of previous SLP evaluation 7/15 and had been scheduled for an OP esophageal dilation 7/28 PTA. PMH includes former EtOH abuse, tobacco abuse, paroxysmal A-fib, chronic HFpEF, nonischemic cardiomyopathy, HTN, HLD, COPD, esophageal dysphagia with history of stricture and dysmotility s/p multiple dilations Type of Study: Bedside Swallow Evaluation Previous Swallow Assessment: see HPI Diet Prior to this Study: Dysphagia 3 (mechanical soft);Thin liquids (Level 0) Temperature Spikes Noted: No Respiratory Status: Nasal cannula (15 LPM HHFNC) History of Recent Intubation: No Behavior/Cognition: Alert;Cooperative;Pleasant mood Oral Cavity Assessment: Within Functional Limits Oral Care Completed by SLP: No Oral Cavity - Dentition: Adequate natural dentition Vision: Functional for self-feeding Self-Feeding Abilities: Able to feed self Patient Positioning: Upright in bed Baseline Vocal Quality: Normal Volitional Cough: Strong Volitional Swallow: Able to elicit    Oral/Motor/Sensory Function Overall Oral Motor/Sensory Function: Within functional limits   Ice Chips Ice chips: Not tested   Thin Liquid Thin Liquid: Within functional limits Presentation: Straw;Self Fed    Nectar Thick Nectar Thick Liquid: Not tested   Honey Thick Honey Thick Liquid: Not tested   Puree Puree: Not tested   Solid     Solid: Within functional limits Presentation: Self Fed      Damien Blumenthal, M.A., CCC-SLP Speech Language Pathology, Acute Rehabilitation Services  Secure Chat preferred [phone]  09/06/2023,2:49 PM

## 2023-09-06 NOTE — Plan of Care (Signed)

## 2023-09-06 NOTE — Progress Notes (Signed)
 Mobility Specialist Progress Note:    09/06/23 1130  Mobility  Activity Dangled on edge of bed  Level of Assistance Standby assist, set-up cues, supervision of patient - no hands on  Assistive Device None  Activity Response Tolerated well  Mobility Referral Yes  Mobility visit 1 Mobility  Mobility Specialist Start Time (ACUTE ONLY) 1130  Mobility Specialist Stop Time (ACUTE ONLY) 1142  Mobility Specialist Time Calculation (min) (ACUTE ONLY) 12 min   Pt received in bed, declined ambulating or transferring to chair. Required max encouragement to dangle EOB. SpO2 84% on 15 L HHFNC, recovered with pursed lip breathing, SpO2 88% on 15L HHFNC. Pt reported dizziness. Pt returned to supine via RN, declined with MS. All needs met, call bell in reach.  BP Supine: 88/50 (64) BP EOB: 86/56 (66) BP EOB 3 min: 93/51 (64)  Saidah Kempton Mobility Specialist Please contact via Special educational needs teacher or  Rehab office at [phone]

## 2023-09-06 NOTE — Progress Notes (Signed)
   NAME:  [name], MRN:  [id number], DOB:  19-Jul-1960, LOS: 10 ADMISSION DATE:  08/27/2023, CONSULTATION DATE:  7/15 REFERRING MD:  [facility], CHIEF COMPLAINT:  hypotension    History of Present Illness:  63yo male smoker with hx HTN, COPD, PAF on eliquis , NICM, HFpEF presented 7/14 with 1 week progressive SOB, cough, malaise, poor appetite. On EMS arrival with hypoxic with sats 80% on RA. In ER initially BP was soft with SBP 80's, tachy 130's but responded well to 1L fluids. CXR revealed dense RUL PNA. He was admitted by [facility], started on IV rocephin /azithro. On 7/15 he was still boarding in ER and again having hypotension with SBP 80's and PCCM was consulted.  Lactate 1.4 WBC 34  Pertinent  Medical History   has a past medical history of Anxiety, Arthritis, Cardiomyopathy (HCC), CHF (congestive heart failure) (HCC), COPD (chronic obstructive pulmonary disease) (HCC), Dysphagia, Dysrhythmia, GERD (gastroesophageal reflux disease), History of blood transfusion, and Hypertension.   Significant Hospital Events: Including procedures, antibiotic start and stop dates in addition to other pertinent events   CT chest 7/14>> Extensive airspace consolidation in the right upper lobe with lesser changes in the right middle and lower lobes consistent with multifocal infiltrate. Short-term follow-up following appropriate therapy is recommended to assess for resolution. Echo 7/15>>>LVEF 55-60% no WMA, LA dilated 7/17 Worsening resp status this am, feels like he cannot catch breath, anxious as well. Got lasix , HHFO2, dex for restlessness.BLEUS neg for DVT 7/18 breathing better 70% FiO2 on heated high flow adding additional pulmonary hygiene measures 7/24 PCCM re-consulted due to increasing O2 needs and worsening chest x-ray  Interim History / Subjective:   He remains short of breath. Not coughing up much mucous  Objective    Blood pressure 128/67, pulse (!) 51, temperature 97.6 F (36.4 C), temperature  source Axillary, resp. rate (!) 22, height 5' 9 (1.753 m), weight 80.8 kg, SpO2 92%.        Intake/Output Summary (Last 24 hours) at 09/06/2023 1145 Last data filed at 09/06/2023 9180 Gross per 24 hour  Intake 250 ml  Output 400 ml  Net -150 ml   Filed Weights   08/28/23 1716 08/30/23 0255  Weight: 77.8 kg 80.8 kg    Examination: General elderly male, no acute distress HEENT normocephalic atraumatic no jugular venous distention is appreciated Pulmonary: crackles on right, no wheezing Cardiac: Regular rate and rhythm without murmur rub or gallop Abdomen: Soft nontender Extremities: Warm dry brisk cap refill Neuro: Awake oriented no focal deficits.  Resolved problem list  Septic shock Assessment and Plan   Acute respiratory failure 2/2 strep constellatus PNA vs HCAP COPD  Tobacco abuse Plan - strep constellatus was intermediate to penicillins, completed course of Zosyn  with notable progression on chest x-ray and increasing O2 needs - start linezolid , meropenem  and bactrim . Covering for HCAP and stenotrophomonas/nocardia. - obtain sputum culture if able - check fungitell - patient amenable to bronchoscopy tomorrow, will plan for BAL - Wean Oxygen for goal SpO2 92% or higher - continue brovana  and yupelri  nebulizer treatments.  Hx NICM/HFpEF and CAD Plan - check BNP  CKD 3  - trend Cr   Dorn Chill, MD [facility] Office: [phone]   See Amion for personal pager PCCM on call pager [phone] until 7pm. Please call Elink 7p-7a. [phone]

## 2023-09-06 NOTE — Progress Notes (Signed)
 PROGRESS NOTE    [name]  FMW:[id number] DOB: [birth year] DOA: 08/27/2023 PCP: Zena Frederick, DO   Brief Narrative:  63yo male smoker with hx HTN, COPD, PAF on eliquis , NICM, HFpEF presented 7/14 with 1 week progressive SOB, cough, malaise, poor appetite. On EMS arrival with hypoxic with sats 80% on RA. In ER initially BP was soft with SBP 80's, tachy 130's but responded well to 1L fluids. CXR revealed dense RUL PNA. He was admitted by [facility], started on IV rocephin /azithro. On 7/15 he was still boarding in ER and again having hypotension with SBP 80's and went into septic shock for which PCCM consulted and patient transitioned under critical care team.  Eventually off of vasopressors and oxygen demand improved so he was transitioned back into [facility] care on 09/02/2023.    Assessment & Plan:   Principal Problem:   Acute sepsis (HCC)  Septic shock and acute respiratory failure with hypoxia secondary to right upper lobe pneumonia: Severe sepsis and acute respiratory failure with hypoxia present at admission.  Patient developed septic shock next day of the admission.  Initially was on Rocephin  and Zithromax , antibiotics were transitioned to Zosyn  and he completed course for that as well.  Patient required vasopressors and has been off of vasopressor since few days.  Urine antigen for Legionella and streptococci negative.  COVID, RSV and flu negative.  Respiratory viral panel negative.  AFB negative.  Lactic acidosis resolved.  Respiratory culture growing Streptococcus constellatus.  Patient completed 7 days of Zosyn .  Hypoxia improved and he was requiring only 8 L of oxygen yesterday however now is requiring 15 L of high flow oxygen so I repeated chest x-ray which shows worsening whiteout of the right-sided I repeated chest x-ray which shows worsening whiteout of the right-sided of the chest compared to the previous x-ray.  I have reconsulted PCCM today.     Elevated troponin: Slightly elevated and flat,  echo ruled out WMA.  Likely demand ischemia.  AKI due to septic ATN: Creatinine improved.   History of paroxysmal A-fib with RVR- Continue home dose amiodarone , metoprolol  and Eliquis  therapy.     Dysphagia: Seen by SLP.  Currently on dysphagia 3 diet.  Hyperlipidemia: Continue statin.  DVT prophylaxis: Eliquis    Code Status: Full Code  Family Communication:  None present at bedside.  Plan of care discussed with patient in length and he/she verbalized understanding and agreed with it.  Status is: Inpatient Remains inpatient appropriate because: Still on high flow oxygen    Estimated body mass index is 26.31 kg/m as calculated from the following:   Height as of this encounter: 5' 9 (1.753 m).   Weight as of this encounter: 80.8 kg.    Nutritional Assessment: Body mass index is 26.31 kg/m.[name] Seen by dietician.  I agree with the assessment and plan as outlined below: Nutrition Status:        . Skin Assessment: I have examined the patient's skin and I agree with the wound assessment as performed by the wound care RN as outlined below:    Consultants:  None  Procedures:  As above  Antimicrobials:  Anti-infectives (From admission, onward)    Start     Dose/Rate Route Frequency Ordered Stop   08/29/23 1015  piperacillin -tazobactam (ZOSYN ) IVPB 3.375 g        3.375 g 12.5 mL/hr over 240 Minutes Intravenous Every 8 hours 08/29/23 0922 09/04/23 2359   08/29/23 1000  doxycycline  (VIBRAMYCIN ) 100 mg in sodium chloride  0.9 %  250 mL IVPB  Status:  Discontinued        100 mg 125 mL/hr over 120 Minutes Intravenous Every 12 hours 08/29/23 0917 08/31/23 1136   08/28/23 1000  azithromycin  (ZITHROMAX ) 500 mg in sodium chloride  0.9 % 250 mL IVPB  Status:  Discontinued        500 mg 250 mL/hr over 60 Minutes Intravenous Every 24 hours 08/27/23 2337 08/29/23 0917   08/28/23 1000  cefTRIAXone  (ROCEPHIN ) 2 g in sodium chloride  0.9 % 100 mL IVPB  Status:  Discontinued        2 g 200  mL/hr over 30 Minutes Intravenous Every 24 hours 08/27/23 2337 08/29/23 0917   08/27/23 1545  cefTRIAXone  (ROCEPHIN ) 1 g in sodium chloride  0.9 % 100 mL IVPB        1 g 200 mL/hr over 30 Minutes Intravenous  Once 08/27/23 1541 08/27/23 1716   08/27/23 1545  azithromycin  (ZITHROMAX ) 500 mg in sodium chloride  0.9 % 250 mL IVPB        500 mg 250 mL/hr over 60 Minutes Intravenous  Once 08/27/23 1541 08/27/23 1746         Subjective: Seen and examined, he has no complaints.  Objective: Vitals:   09/05/23 2327 09/06/23 0338 09/06/23 0754 09/06/23 0801  BP: 129/85 91/75 128/67 128/67  Pulse: 98 87 (!) 54 (!) 51  Resp:  17 19 (!) 22  Temp:  98.6 F (37 C) 97.6 F (36.4 C)   TempSrc:  Oral Axillary   SpO2:  95% 90% 92%  Weight:      Height:        Intake/Output Summary (Last 24 hours) at 09/06/2023 1110 Last data filed at 09/06/2023 0819 Gross per 24 hour  Intake 490 ml  Output 950 ml  Net -460 ml   Filed Weights   08/28/23 1716 08/30/23 0255  Weight: 77.8 kg 80.8 kg    Examination:  General exam: Appears calm and comfortable  Respiratory system: Diminished breath sounds bilaterally. Respiratory effort normal. Cardiovascular system: S1 & S2 heard, RRR. No JVD, murmurs, rubs, gallops or clicks. No pedal edema. Gastrointestinal system: Abdomen is nondistended, soft and nontender. No organomegaly or masses felt. Normal bowel sounds heard. Central nervous system: Alert and oriented. No focal neurological deficits. Extremities: Symmetric 5 x 5 power. Skin: No rashes, lesions or ulcers.  Psychiatry: Judgement and insight appear normal. Mood & affect appropriate.   Data Reviewed: I have personally reviewed following labs and imaging studies  CBC: Recent Labs  Lab 08/31/23 0405 09/01/23 0436 09/02/23 0400 09/03/23 0427  WBC 18.2* 27.5* 29.9* 19.7*  HGB 9.7* 11.0* 10.7* 10.8*  HCT 30.7* 34.1* 32.4* 34.1*  MCV 83.9 82.6 81.4 84.2  PLT 311 349 361 311   Basic Metabolic  Panel: Recent Labs  Lab 08/31/23 0405 09/01/23 0436 09/02/23 0400 09/03/23 0427 09/04/23 0340  NA 143 142 142 144 142  K 4.2 4.1 4.3 4.7 4.6  CL 103 104 103 105 104  CO2 32 31 31 33* 27  GLUCOSE 164* 175* 115* 104* 82  BUN 48* 44* 44* 39* 40*  CREATININE 1.51* 1.03 0.90 0.91 0.96  CALCIUM  8.5* 8.5* 8.6* 8.7* 8.5*   GFR: Estimated Creatinine Clearance: 78.8 mL/min (by C-G formula based on SCr of 0.96 mg/dL). Liver Function Tests: Recent Labs  Lab 08/31/23 0405 09/01/23 0436 09/02/23 0400 09/03/23 0427 09/04/23 0340  AST 31 38 76* 64* 143*  ALT 22 32 81* 89* 156*  ALKPHOS 75 80  93 89 93  BILITOT 0.7 0.9 0.8 0.8 0.7  PROT 5.9* 5.9* 5.8* 5.9* 5.8*  ALBUMIN  3.2* 2.6* 2.6* 2.4* 2.4*   No results for input(s): LIPASE, AMYLASE in the last 168 hours. No results for input(s): AMMONIA in the last 168 hours. Coagulation Profile: No results for input(s): INR, PROTIME in the last 168 hours.  Cardiac Enzymes: No results for input(s): CKTOTAL, CKMB, CKMBINDEX, TROPONINI in the last 168 hours.  BNP (last 3 results) No results for input(s): PROBNP in the last 8760 hours. HbA1C: No results for input(s): HGBA1C in the last 72 hours.  CBG: Recent Labs  Lab 09/01/23 1201 09/01/23 1626  GLUCAP 166* 176*   Lipid Profile: No results for input(s): CHOL, HDL, LDLCALC, TRIG, CHOLHDL, LDLDIRECT in the last 72 hours. Thyroid Function Tests: No results for input(s): TSH, T4TOTAL, FREET4, T3FREE, THYROIDAB in the last 72 hours. Anemia Panel: No results for input(s): VITAMINB12, FOLATE, FERRITIN, TIBC, IRON, RETICCTPCT in the last 72 hours.  Sepsis Labs: No results for input(s): PROCALCITON, LATICACIDVEN in the last 168 hours.   Recent Results (from the past 240 hours)  Culture, blood (routine x 2)     Status: None   Collection Time: 08/27/23  1:42 PM   Specimen: BLOOD  Result Value Ref Range Status   Specimen  Description BLOOD LEFT ANTECUBITAL  Final   Special Requests   Final    BOTTLES DRAWN AEROBIC AND ANAEROBIC Blood Culture results may not be optimal due to an inadequate volume of blood received in culture bottles   Culture   Final    NO GROWTH 5 DAYS Performed at [facility], [address] [place], [place] [address]    Report Status 09/01/2023 FINAL  Final  Acid Fast Smear (AFB)     Status: None   Collection Time: 08/27/23  2:00 PM   Specimen: Sputum  Result Value Ref Range Status   AFB Specimen Processing Concentration  Final   Acid Fast Smear Negative  Final    Comment: (NOTE) Performed At: [facility] [address] [place], [place] [id number] Jennette Shorter MD Ey:[id number]    Source (AFB) SPU  Final    Comment: Performed at [facility], [address], [place], [place] [address]  Culture, blood (routine x 2)     Status: None   Collection Time: 08/27/23  3:19 PM   Specimen: BLOOD RIGHT HAND  Result Value Ref Range Status   Specimen Description BLOOD RIGHT HAND  Final   Special Requests   Final    BOTTLES DRAWN AEROBIC AND ANAEROBIC Blood Culture results may not be optimal due to an inadequate volume of blood received in culture bottles   Culture   Final    NO GROWTH 5 DAYS Performed at [facility], [address] [place], [place] [address]    Report Status 09/01/2023 FINAL  Final  SARS Coronavirus 2 by RT PCR (hospital order, performed in [facility] hospital lab) *cepheid single result test* Anterior Nasal Swab     Status: None   Collection Time: 08/27/23  3:42 PM   Specimen: Anterior Nasal Swab  Result Value Ref Range Status   SARS Coronavirus 2 by RT PCR NEGATIVE NEGATIVE Final    Comment: Performed at [facility], [address], [place], [place] [address]  Expectorated Sputum Assessment w Gram Stain, Rflx to Resp Cult     Status: None   Collection Time: 08/27/23 11:16 PM  Specimen: Sputum  Result Value Ref Range Status    Specimen Description SPU  Final   Special Requests NONE  Final   Sputum evaluation   Final    THIS SPECIMEN IS ACCEPTABLE FOR SPUTUM CULTURE Performed at [facility], [address], [place], [place] [address]    Report Status 08/28/2023 FINAL  Final  Culture, Respiratory w Gram Stain     Status: None   Collection Time: 08/27/23 11:16 PM   Specimen: Sputum  Result Value Ref Range Status   Specimen Description SPU  Final   Special Requests NONE Reflexed from [id number]  Final   Gram Stain   Final    ABUNDANT WBC PRESENT, PREDOMINANTLY PMN ABUNDANT GRAM POSITIVE COCCI FEW GRAM POSITIVE RODS Performed at [facility], [address] [place], [place] [address]    Culture ABUNDANT STREPTOCOCCUS CONSTELLATUS  Final   Report Status 08/31/2023 FINAL  Final   Organism ID, Bacteria STREPTOCOCCUS CONSTELLATUS  Final      Susceptibility   Streptococcus constellatus - MIC*    PENICILLIN INTERMEDIATE Intermediate     CEFTRIAXONE  1 SENSITIVE Sensitive     ERYTHROMYCIN 4 RESISTANT Resistant     LEVOFLOXACIN <=0.25 SENSITIVE Sensitive     VANCOMYCIN 0.5 SENSITIVE Sensitive     * ABUNDANT STREPTOCOCCUS CONSTELLATUS  Expectorated Sputum Assessment w Gram Stain, Rflx to Resp Cult     Status: None   Collection Time: 08/28/23  1:28 PM   Specimen: Expectorated Sputum  Result Value Ref Range Status   Specimen Description EXPECTORATED SPUTUM  Final   Special Requests NONE  Final   Sputum evaluation   Final    THIS SPECIMEN IS ACCEPTABLE FOR SPUTUM CULTURE Performed at [facility], [address] [place], [place] [address]    Report Status 08/28/2023 FINAL  Final  MTB-RIF NAA with AFB Culture, sputum (q8 x 3)     Status: None (Preliminary result)   Collection Time: 08/28/23  1:28 PM   Specimen: Sputum  Result Value Ref Range Status   Myco tuberculosis Complex NOT DETECTED NOT DETECTED Final   Rifampin Not applicable NOT DETECTED Final   AFB Specimen Processing Concentration   Final    Comment: (NOTE) Performed At: [facility] [address] [place], [place] [id number] Jennette Shorter MD Ey:[id number]    Acid Fast Culture PENDING  Incomplete   Source (MTB RIF) SPU  Final    Comment: Performed at [facility], [address] [place], [place] [address]  Culture, Respiratory w Gram Stain     Status: None   Collection Time: 08/28/23  1:28 PM  Result Value Ref Range Status   Specimen Description EXPECTORATED SPUTUM  Final   Special Requests NONE Reflexed from [id number]  Final   Gram Stain   Final    ABUNDANT WBC PRESENT, PREDOMINANTLY PMN ABUNDANT GRAM POSITIVE COCCI IN PAIRS FEW GRAM POSITIVE RODS RARE GRAM NEGATIVE RODS    Culture   Final    ABUNDANT STREPTOCOCCUS CONSTELLATUS SUSCEPTIBILITIES PERFORMED ON PREVIOUS CULTURE WITHIN THE LAST 5 DAYS. Performed at [facility], [address] [place], [place] [address]    Report Status 08/31/2023 FINAL  Final  MRSA Next Gen by PCR, Nasal     Status: None   Collection Time: 08/28/23  2:57 PM   Specimen: Nasal Mucosa; Nasal Swab  Result Value Ref Range Status   MRSA by PCR Next Gen NOT DETECTED NOT DETECTED Final  Comment: (NOTE) The GeneXpert MRSA Assay (FDA approved for NASAL specimens only), is one component of a comprehensive MRSA colonization surveillance program. It is not intended to diagnose MRSA infection nor to guide or monitor treatment for MRSA infections. Test performance is not FDA approved in patients less than 53 years old. Performed at [facility], [address], [place], [place] [address]   MTB-RIF NAA with AFB Culture, sputum (q8 x 3)     Status: None (Preliminary result)   Collection Time: 08/28/23  9:28 PM   Specimen: Sputum  Result Value Ref Range Status   Myco tuberculosis Complex NOT DETECTED NOT DETECTED Final   Rifampin Not applicable NOT DETECTED Final   AFB Specimen Processing Concentration  Final    Comment: (NOTE) Performed At: [facility] [address] [place], [place] [id number] Jennette Shorter MD Ey:[id number]    Acid Fast Culture PENDING  Incomplete   Source (MTB RIF) SPU  Final    Comment: Performed at [facility], [address] [place], [place] [address]  MTB-RIF NAA with AFB Culture, sputum (q8 x 3)     Status: None (Preliminary result)   Collection Time: 08/28/23  9:30 PM   Specimen: Sputum  Result Value Ref Range Status   Myco tuberculosis Complex NOT DETECTED NOT DETECTED Final   Rifampin Not applicable NOT DETECTED Final   AFB Specimen Processing Concentration  Final    Comment: (NOTE) Performed At: [facility] [address] [place], [place] [id number] Jennette Shorter MD Ey:[id number]    Acid Fast Culture PENDING  Incomplete   Source (MTB RIF) SPUTUM  Final    Comment: Performed at [facility] Lab, [address], [place], [place] [address]     Radiology Studies: No results found.  Scheduled Meds:  amiodarone   200 mg Oral Daily   apixaban   5 mg Oral BID   arformoterol   15 mcg Nebulization BID   atorvastatin   40 mg Oral Daily   Chlorhexidine  Gluconate Cloth  6 each Topical Daily   feeding supplement  237 mL Oral BID BM   metoprolol  tartrate  25 mg Oral BID   nicotine   21 mg Transdermal Daily   mouth rinse  15 mL Mouth Rinse 4 times per day   pantoprazole   80 mg Oral Q1200   revefenacin   175 mcg Nebulization Daily   sodium chloride  flush  10-40 mL Intracatheter Q12H   thiamine   100 mg Oral Daily   Continuous Infusions:     LOS: 10 days   Fredia Skeeter, MD [facility]  09/06/2023, 11:10 AM   *Please note that this is a verbal dictation therefore any spelling or grammatical errors are due to the Dragon Medical One system interpretation.  Please page via Amion and do not message via secure chat for urgent patient care matters. Secure chat can be used for non urgent patient care matters.  How to contact the [facility] Attending or Consulting provider 7A - 7P  or covering provider during after hours 7P -7A, for this patient?  Check the care team in [facility] and look for a) attending/consulting [facility] provider listed and b) the [facility] team listed. Page or secure chat 7A-7P. Log into [email or web address] and use [place]'s universal password to access. If you do not have the password, please contact the hospital operator. Locate the [facility] provider you are looking for under [facility] and page to a number that you can be directly reached. If you still  have difficulty reaching the provider, please page the [place] (Director on Call) for the Hospitalists listed on amion for assistance.

## 2023-09-07 ENCOUNTER — Inpatient Hospital Stay (HOSPITAL_COMMUNITY)

## 2023-09-07 ENCOUNTER — Inpatient Hospital Stay (HOSPITAL_COMMUNITY): Admitting: Certified Registered Nurse Anesthetist

## 2023-09-07 ENCOUNTER — Encounter (HOSPITAL_COMMUNITY)
Admission: EM | Disposition: A | Discharge status: Home / Self Care | Payer: Self-pay | Source: Home / Self Care | Attending: Internal Medicine

## 2023-09-07 ENCOUNTER — Encounter (HOSPITAL_COMMUNITY): Payer: Self-pay | Admitting: Internal Medicine

## 2023-09-07 DIAGNOSIS — J9601 Acute respiratory failure with hypoxia: Secondary | ICD-10-CM | POA: Diagnosis not present

## 2023-09-07 DIAGNOSIS — I2510 Atherosclerotic heart disease of native coronary artery without angina pectoris: Secondary | ICD-10-CM | POA: Diagnosis not present

## 2023-09-07 DIAGNOSIS — I5023 Acute on chronic systolic (congestive) heart failure: Secondary | ICD-10-CM | POA: Diagnosis not present

## 2023-09-07 DIAGNOSIS — J449 Chronic obstructive pulmonary disease, unspecified: Secondary | ICD-10-CM | POA: Diagnosis not present

## 2023-09-07 DIAGNOSIS — I110 Hypertensive heart disease with heart failure: Secondary | ICD-10-CM

## 2023-09-07 DIAGNOSIS — J189 Pneumonia, unspecified organism: Secondary | ICD-10-CM

## 2023-09-07 DIAGNOSIS — J154 Pneumonia due to other streptococci: Secondary | ICD-10-CM | POA: Diagnosis not present

## 2023-09-07 DIAGNOSIS — A419 Sepsis, unspecified organism: Secondary | ICD-10-CM | POA: Diagnosis not present

## 2023-09-07 HISTORY — PX: FLEXIBLE BRONCHOSCOPY: SHX5094

## 2023-09-07 LAB — GLUCOSE, CAPILLARY
Glucose-Capillary: 156 mg/dL — ABNORMAL HIGH (ref 70–99)
Glucose-Capillary: 191 mg/dL — ABNORMAL HIGH (ref 70–99)
Glucose-Capillary: 63 mg/dL — ABNORMAL LOW (ref 70–99)
Glucose-Capillary: 66 mg/dL — ABNORMAL LOW (ref 70–99)
Glucose-Capillary: 87 mg/dL (ref 70–99)
Glucose-Capillary: 89 mg/dL (ref 70–99)

## 2023-09-07 LAB — CBC WITH DIFFERENTIAL/PLATELET
Abs Immature Granulocytes: 0.49 K/uL — ABNORMAL HIGH (ref 0.00–0.07)
Basophils Absolute: 0.1 K/uL (ref 0.0–0.1)
Basophils Relative: 0 %
Eosinophils Absolute: 0 K/uL (ref 0.0–0.5)
Eosinophils Relative: 0 %
HCT: 34.9 % — ABNORMAL LOW (ref 39.0–52.0)
Hemoglobin: 10.8 g/dL — ABNORMAL LOW (ref 13.0–17.0)
Immature Granulocytes: 3 %
Lymphocytes Relative: 4 %
Lymphs Abs: 0.7 K/uL (ref 0.7–4.0)
MCH: 26.5 pg (ref 26.0–34.0)
MCHC: 30.9 g/dL (ref 30.0–36.0)
MCV: 85.5 fL (ref 80.0–100.0)
Monocytes Absolute: 0.7 K/uL (ref 0.1–1.0)
Monocytes Relative: 4 %
Neutro Abs: 14.1 K/uL — ABNORMAL HIGH (ref 1.7–7.7)
Neutrophils Relative %: 89 %
Platelets: 234 K/uL (ref 150–400)
RBC: 4.08 MIL/uL — ABNORMAL LOW (ref 4.22–5.81)
RDW: 18.9 % — ABNORMAL HIGH (ref 11.5–15.5)
WBC: 16.1 K/uL — ABNORMAL HIGH (ref 4.0–10.5)
nRBC: 0 % (ref 0.0–0.2)

## 2023-09-07 LAB — BODY FLUID CELL COUNT WITH DIFFERENTIAL
Eos, Fluid: 1 %
Lymphs, Fluid: 19 %
Monocyte-Macrophage-Serous Fluid: 8 % — ABNORMAL LOW (ref 50–90)
Neutrophil Count, Fluid: 72 % — ABNORMAL HIGH (ref 0–25)
Total Nucleated Cell Count, Fluid: 80 uL (ref 0–1000)

## 2023-09-07 LAB — BASIC METABOLIC PANEL WITH GFR
Anion gap: 7 (ref 5–15)
BUN: 18 mg/dL (ref 8–23)
CO2: 27 mmol/L (ref 22–32)
Calcium: 7.7 mg/dL — ABNORMAL LOW (ref 8.9–10.3)
Chloride: 102 mmol/L (ref 98–111)
Creatinine, Ser: 0.88 mg/dL (ref 0.61–1.24)
GFR, Estimated: >60 mL/min (ref >60)
Glucose, Bld: 162 mg/dL — ABNORMAL HIGH (ref 70–99)
Potassium: 4.6 mmol/L (ref 3.5–5.1)
Sodium: 136 mmol/L (ref 135–145)

## 2023-09-07 LAB — MAGNESIUM: Magnesium: 1.9 mg/dL (ref 1.7–2.4)

## 2023-09-07 LAB — PHOSPHORUS: Phosphorus: 3.1 mg/dL (ref 2.5–4.6)

## 2023-09-07 MED ORDER — OXYCODONE HCL 5 MG PO TABS: 5.0000 mg | ORAL_TABLET | Freq: Once | ORAL | Status: DC | PRN

## 2023-09-07 MED ORDER — SUGAMMADEX SODIUM 200 MG/2ML IV SOLN
INTRAVENOUS | Status: DC | PRN
  Administered 2023-09-07: 200 mg via INTRAVENOUS

## 2023-09-07 MED ORDER — ALBUMIN HUMAN 25 % IV SOLN
25.0000 g | Freq: Four times a day (QID) | INTRAVENOUS | Status: AC
  Administered 2023-09-07 – 2023-09-08 (×3): 25 g via INTRAVENOUS
  Filled 2023-09-07 (×3): qty 100

## 2023-09-07 MED ORDER — AMIODARONE HCL 200 MG PO TABS
200.0000 mg | ORAL_TABLET | Freq: Every day | ORAL | Status: DC
  Administered 2023-09-08 – 2023-09-18 (×11): 200 mg
  Filled 2023-09-07 (×11): qty 1

## 2023-09-07 MED ORDER — GUAIFENESIN-DM 100-10 MG/5ML PO SYRP
5.0000 mL | ORAL_SOLUTION | ORAL | Status: DC | PRN
  Administered 2023-09-09 – 2023-09-13 (×5): 5 mL
  Filled 2023-09-07 (×2): qty 10
  Filled 2023-09-07 (×2): qty 5
  Filled 2023-09-07 (×3): qty 10

## 2023-09-07 MED ORDER — THIAMINE MONONITRATE 100 MG PO TABS
100.0000 mg | ORAL_TABLET | Freq: Every day | ORAL | Status: DC
  Administered 2023-09-08 – 2023-09-18 (×11): 100 mg
  Filled 2023-09-07 (×11): qty 1

## 2023-09-07 MED ORDER — DEXTROSE 50 % IV SOLN: 25.0000 mL | Freq: Once | INTRAVENOUS | Status: AC

## 2023-09-07 MED ORDER — APIXABAN 5 MG PO TABS
5.0000 mg | ORAL_TABLET | Freq: Two times a day (BID) | ORAL | Status: DC
  Administered 2023-09-07 – 2023-09-18 (×22): 5 mg
  Filled 2023-09-07 (×22): qty 1

## 2023-09-07 MED ORDER — DEXAMETHASONE SODIUM PHOSPHATE 10 MG/ML IJ SOLN
INTRAMUSCULAR | Status: DC | PRN
  Administered 2023-09-07: 10 mg via INTRAVENOUS

## 2023-09-07 MED ORDER — MELATONIN 5 MG PO TABS
5.0000 mg | ORAL_TABLET | Freq: Every evening | ORAL | Status: DC | PRN
  Administered 2023-09-07 – 2023-09-18 (×8): 5 mg
  Filled 2023-09-07 (×8): qty 1

## 2023-09-07 MED ORDER — PANTOPRAZOLE SODIUM 40 MG IV SOLR
40.0000 mg | Freq: Two times a day (BID) | INTRAVENOUS | Status: DC
  Administered 2023-09-07 – 2023-09-19 (×24): 40 mg via INTRAVENOUS
  Filled 2023-09-07 (×24): qty 10

## 2023-09-07 MED ORDER — PROSOURCE TF20 ENFIT COMPATIBL EN LIQD
60.0000 mL | Freq: Every day | ENTERAL | Status: DC
  Administered 2023-09-07 – 2023-09-21 (×14): 60 mL
  Filled 2023-09-07 (×14): qty 60

## 2023-09-07 MED ORDER — PHENYLEPHRINE HCL-NACL 20-0.9 MG/250ML-% IV SOLN
INTRAVENOUS | Status: DC | PRN
  Administered 2023-09-07: 50 ug/min via INTRAVENOUS

## 2023-09-07 MED ORDER — NOREPINEPHRINE 4 MG/250ML-% IV SOLN
0.0000 ug/min | INTRAVENOUS | Status: DC
  Administered 2023-09-07: 2 ug/min via INTRAVENOUS
  Filled 2023-09-07 (×2): qty 250

## 2023-09-07 MED ORDER — FENTANYL CITRATE (PF) 100 MCG/2ML IJ SOLN
INTRAMUSCULAR | Status: AC
Start: 2023-09-07 — End: 2023-09-07
  Filled 2023-09-07: qty 2

## 2023-09-07 MED ORDER — ADULT MULTIVITAMIN W/MINERALS CH
1.0000 | ORAL_TABLET | Freq: Every day | ORAL | Status: DC
  Administered 2023-09-07 – 2023-09-18 (×12): 1
  Filled 2023-09-07 (×12): qty 1

## 2023-09-07 MED ORDER — OSMOLITE 1.5 CAL PO LIQD
1000.0000 mL | ORAL | Status: DC
  Administered 2023-09-07 – 2023-09-18 (×9): 1000 mL
  Filled 2023-09-07 (×16): qty 1000

## 2023-09-07 MED ORDER — FENTANYL CITRATE (PF) 100 MCG/2ML IJ SOLN: 25.0000 ug | INTRAMUSCULAR | Status: DC | PRN

## 2023-09-07 MED ORDER — ROCURONIUM BROMIDE 10 MG/ML (PF) SYRINGE
PREFILLED_SYRINGE | INTRAVENOUS | Status: DC | PRN
  Administered 2023-09-07: 50 mg via INTRAVENOUS

## 2023-09-07 MED ORDER — ORAL CARE MOUTH RINSE
15.0000 mL | OROMUCOSAL | Status: DC
  Administered 2023-09-08 – 2023-09-26 (×68): 15 mL via OROMUCOSAL

## 2023-09-07 MED ORDER — ORAL CARE MOUTH RINSE: 15.0000 mL | OROMUCOSAL | Status: DC | PRN

## 2023-09-07 MED ORDER — DEXTROSE 50 % IV SOLN
INTRAVENOUS | Status: AC
  Administered 2023-09-07: 25 mL via INTRAVENOUS
  Filled 2023-09-07: qty 50

## 2023-09-07 MED ORDER — ONDANSETRON HCL 4 MG/2ML IJ SOLN: 4.0000 mg | Freq: Four times a day (QID) | INTRAMUSCULAR | Status: DC | PRN

## 2023-09-07 MED ORDER — POLYETHYLENE GLYCOL 3350 17 G PO PACK: 17.0000 g | PACK | Freq: Every day | ORAL | Status: DC | PRN

## 2023-09-07 MED ORDER — OXYCODONE HCL 5 MG/5ML PO SOLN: 5.0000 mg | Freq: Once | ORAL | Status: DC | PRN

## 2023-09-07 MED ORDER — LIDOCAINE HCL (CARDIAC) PF 100 MG/5ML IV SOSY
PREFILLED_SYRINGE | INTRAVENOUS | Status: DC | PRN
  Administered 2023-09-07: 50 mg via INTRAVENOUS

## 2023-09-07 MED ORDER — PROPOFOL 10 MG/ML IV BOLUS
INTRAVENOUS | Status: DC | PRN
  Administered 2023-09-07: 110 mg via INTRAVENOUS

## 2023-09-07 MED ORDER — SODIUM CHLORIDE 0.9 % IV SOLN
250.0000 mL | INTRAVENOUS | Status: AC
  Administered 2023-09-07: 250 mL via INTRAVENOUS

## 2023-09-07 MED ORDER — OXYCODONE-ACETAMINOPHEN 5-325 MG PO TABS
1.0000 | ORAL_TABLET | Freq: Four times a day (QID) | ORAL | Status: DC | PRN
  Administered 2023-09-07 – 2023-09-19 (×26): 1
  Filled 2023-09-07 (×27): qty 1

## 2023-09-07 MED ORDER — ONDANSETRON HCL 4 MG/2ML IJ SOLN
INTRAMUSCULAR | Status: DC | PRN
  Administered 2023-09-07: 4 mg via INTRAVENOUS

## 2023-09-07 MED ORDER — MAGNESIUM SULFATE 2 GM/50ML IV SOLN
2.0000 g | Freq: Once | INTRAVENOUS | Status: AC
  Administered 2023-09-07: 2 g via INTRAVENOUS
  Filled 2023-09-07: qty 50

## 2023-09-07 MED ORDER — ATORVASTATIN CALCIUM 40 MG PO TABS
40.0000 mg | ORAL_TABLET | Freq: Every day | ORAL | Status: DC
  Administered 2023-09-08 – 2023-09-18 (×11): 40 mg
  Filled 2023-09-07 (×11): qty 1

## 2023-09-07 MED ORDER — DIAZEPAM 5 MG PO TABS
5.0000 mg | ORAL_TABLET | Freq: Four times a day (QID) | ORAL | Status: DC | PRN
  Administered 2023-09-07 – 2023-09-08 (×2): 5 mg
  Filled 2023-09-07: qty 3
  Filled 2023-09-07: qty 1

## 2023-09-07 MED ORDER — SODIUM CHLORIDE 0.9 % IV SOLN
INTRAVENOUS | Status: AC | PRN
  Administered 2023-09-07: 500 mL via INTRAVENOUS

## 2023-09-07 MED ORDER — METOPROLOL TARTRATE 25 MG PO TABS: 25.0000 mg | ORAL_TABLET | Freq: Two times a day (BID) | ORAL | Status: DC

## 2023-09-07 MED ORDER — PHENYLEPHRINE 80 MCG/ML (10ML) SYRINGE FOR IV PUSH (FOR BLOOD PRESSURE SUPPORT)
PREFILLED_SYRINGE | INTRAVENOUS | Status: DC | PRN
  Administered 2023-09-07 (×2): 80 ug via INTRAVENOUS

## 2023-09-07 NOTE — Progress Notes (Signed)
 Pt going down for Chest CT on tele and 15L .   2300: Pt returned from CT. NAD

## 2023-09-07 NOTE — Progress Notes (Addendum)
 PROGRESS NOTE    [name]  FMW:[id number] DOB: April 25, 1960 DOA: 08/27/2023 PCP: Zena Frederick, DO   Brief Narrative:  63yo male smoker with hx HTN, COPD, PAF on eliquis , NICM, HFpEF presented 7/14 with 1 week progressive SOB, cough, malaise, poor appetite. On EMS arrival with hypoxic with sats 80% on RA. In ER initially BP was soft with SBP 80's, tachy 130's but responded well to 1L fluids. CXR revealed dense RUL PNA. He was admitted by [facility], started on IV rocephin /azithro. On 7/15 he was still boarding in ER and again having hypotension with SBP 80's and went into septic shock for which PCCM consulted and patient transitioned under critical care team.  Eventually off of vasopressors and oxygen demand improved so he was transitioned back into [facility] care on 09/02/2023.    Assessment & Plan:   Principal Problem:   Acute sepsis (HCC)  Septic shock and acute respiratory failure with hypoxia secondary to right upper lobe pneumonia: Severe sepsis and acute respiratory failure with hypoxia present at admission.  Patient developed septic shock next day of the admission.  Initially was on Rocephin  and Zithromax , antibiotics were transitioned to Zosyn  and he completed course for that as well.  Patient required vasopressors and has been off of vasopressor since few days.  Urine antigen for Legionella and streptococci negative.  COVID, RSV and flu negative.  Respiratory viral panel negative.  AFB negative.  Lactic acidosis resolved.  Respiratory culture growing Streptococcus constellatus.  Patient completed 7 days of Zosyn .  Hypoxia improved and he was requiring only 8 L of oxygen, however yesterday he was more hypoxic and requiring 15 L of high flow oxygen so I repeated chest x-ray which shows increased right lung opacity diffusely concerning for worsening right-sided pneumonia.  For which I reconsulted PCCM, they obtained CT chest which shows extensive airspace disease and consolidation in the right upper lobe  with slightly progressive foci of cavitation in the right upper lobe consolidation suggestive of necrosis, and patient is now scheduled for bronchoscopy today.  He is further more hypoxic today and requiring 30 L of high flow oxygen.  He was also started on triple antibiotic, Bactrim  DS, linezolid  and Merrem  by PCCM.  Appreciate PCCM help.     Elevated troponin: Slightly elevated and flat, echo ruled out WMA.  Likely demand ischemia.  AKI due to septic ATN: Creatinine improved.   History of paroxysmal A-fib with RVR- Continue home dose amiodarone , metoprolol  and Eliquis  therapy.     Dysphagia: Seen by SLP.  Currently on dysphagia 3 diet.  Hyperlipidemia: Continue statin.  Addendum/update: Received a message from Dr. Kara that patient will be going to ICU under critical care.  Hospitalist will sign off.  DVT prophylaxis: Eliquis    Code Status: Full Code  Family Communication:  None present at bedside.  Plan of care discussed with patient in length and he/she verbalized understanding and agreed with it.  Status is: Inpatient Remains inpatient appropriate because: Still on high flow oxygen, getting bronchoscopy today   Estimated body mass index is 26.31 kg/m as calculated from the following:   Height as of this encounter: 5' 9 (1.753 m).   Weight as of this encounter: 80.8 kg.    Nutritional Assessment: Body mass index is 26.31 kg/m.[name] Seen by dietician.  I agree with the assessment and plan as outlined below: Nutrition Status:        . Skin Assessment: I have examined the patient's skin and I agree with the wound assessment  as performed by the wound care RN as outlined below:    Consultants:  None  Procedures:  As above  Antimicrobials:  Anti-infectives (From admission, onward)    Start     Dose/Rate Route Frequency Ordered Stop   09/06/23 1400  meropenem  (MERREM ) 1 g in sodium chloride  0.9 % 100 mL IVPB        1 g 200 mL/hr over 30 Minutes Intravenous Every 8  hours 09/06/23 1246     09/06/23 1400  sulfamethoxazole -trimethoprim  (BACTRIM ) 404 mg of trimethoprim  in dextrose  5 % 500 mL IVPB        15 mg/kg/day of trimethoprim   80.8 kg 350.2 mL/hr over 90 Minutes Intravenous Every 8 hours 09/06/23 1253     09/06/23 1245  linezolid  (ZYVOX ) IVPB 600 mg        600 mg 300 mL/hr over 60 Minutes Intravenous Every 12 hours 09/06/23 1145     08/29/23 1015  piperacillin -tazobactam (ZOSYN ) IVPB 3.375 g        3.375 g 12.5 mL/hr over 240 Minutes Intravenous Every 8 hours 08/29/23 0922 09/04/23 2359   08/29/23 1000  doxycycline  (VIBRAMYCIN ) 100 mg in sodium chloride  0.9 % 250 mL IVPB  Status:  Discontinued        100 mg 125 mL/hr over 120 Minutes Intravenous Every 12 hours 08/29/23 0917 08/31/23 1136   08/28/23 1000  azithromycin  (ZITHROMAX ) 500 mg in sodium chloride  0.9 % 250 mL IVPB  Status:  Discontinued        500 mg 250 mL/hr over 60 Minutes Intravenous Every 24 hours 08/27/23 2337 08/29/23 0917   08/28/23 1000  cefTRIAXone  (ROCEPHIN ) 2 g in sodium chloride  0.9 % 100 mL IVPB  Status:  Discontinued        2 g 200 mL/hr over 30 Minutes Intravenous Every 24 hours 08/27/23 2337 08/29/23 0917   08/27/23 1545  cefTRIAXone  (ROCEPHIN ) 1 g in sodium chloride  0.9 % 100 mL IVPB        1 g 200 mL/hr over 30 Minutes Intravenous  Once 08/27/23 1541 08/27/23 1716   08/27/23 1545  azithromycin  (ZITHROMAX ) 500 mg in sodium chloride  0.9 % 250 mL IVPB        500 mg 250 mL/hr over 60 Minutes Intravenous  Once 08/27/23 1541 08/27/23 1746         Subjective: Patient seen and examined, despite of requiring higher amount of oxygen, he denies any shortness of breath and appears comfortable.  Objective: Vitals:   09/07/23 0600 09/07/23 0602 09/07/23 0735 09/07/23 0746  BP: 102/68   108/60  Pulse: (!) 48  (!) 51 (!) 48  Resp: (!) 22  20 (!) 23  Temp:    97.6 F (36.4 C)  TempSrc:    Oral  SpO2: 95% 95% (!) 78% 92%  Weight:      Height:        Intake/Output  Summary (Last 24 hours) at 09/07/2023 0846 Last data filed at 09/07/2023 0537 Gross per 24 hour  Intake 2834.51 ml  Output 875 ml  Net 1959.51 ml   Filed Weights   08/28/23 1716 08/30/23 0255  Weight: 77.8 kg 80.8 kg    Examination:  General exam: Appears calm and comfortable  Respiratory system: diminished breath sounds bilaterally. Respiratory effort normal. Cardiovascular system: S1 & S2 heard, RRR. No JVD, murmurs, rubs, gallops or clicks. No pedal edema. Gastrointestinal system: Abdomen is nondistended, soft and nontender. No organomegaly or masses felt. Normal bowel sounds heard. Central  nervous system: Alert and oriented. No focal neurological deficits. Extremities: Symmetric 5 x 5 power. Skin: No rashes, lesions or ulcers.  Psychiatry: Judgement and insight appear normal. Mood & affect appropriate.   Data Reviewed: I have personally reviewed following labs and imaging studies  CBC: Recent Labs  Lab 09/01/23 0436 09/02/23 0400 09/03/23 0427 09/06/23 1240  WBC 27.5* 29.9* 19.7* 20.2*  NEUTROABS  --   --   --  17.3*  HGB 11.0* 10.7* 10.8* 10.9*  HCT 34.1* 32.4* 34.1* 34.7*  MCV 82.6 81.4 84.2 85.5  PLT 349 361 311 294   Basic Metabolic Panel: Recent Labs  Lab 09/01/23 0436 09/02/23 0400 09/03/23 0427 09/04/23 0340 09/06/23 1240  NA 142 142 144 142 144  K 4.1 4.3 4.7 4.6 4.4  CL 104 103 105 104 106  CO2 31 31 33* 27 31  GLUCOSE 175* 115* 104* 82 85  BUN 44* 44* 39* 40* 25*  CREATININE 1.03 0.90 0.91 0.96 0.81  CALCIUM  8.5* 8.6* 8.7* 8.5* 8.2*   GFR: Estimated Creatinine Clearance: 93.3 mL/min (by C-G formula based on SCr of 0.81 mg/dL). Liver Function Tests: Recent Labs  Lab 09/01/23 0436 09/02/23 0400 09/03/23 0427 09/04/23 0340  AST 38 76* 64* 143*  ALT 32 81* 89* 156*  ALKPHOS 80 93 89 93  BILITOT 0.9 0.8 0.8 0.7  PROT 5.9* 5.8* 5.9* 5.8*  ALBUMIN  2.6* 2.6* 2.4* 2.4*   No results for input(s): LIPASE, AMYLASE in the last 168 hours. No  results for input(s): AMMONIA in the last 168 hours. Coagulation Profile: No results for input(s): INR, PROTIME in the last 168 hours.  Cardiac Enzymes: No results for input(s): CKTOTAL, CKMB, CKMBINDEX, TROPONINI in the last 168 hours.  BNP (last 3 results) No results for input(s): PROBNP in the last 8760 hours. HbA1C: No results for input(s): HGBA1C in the last 72 hours.  CBG: Recent Labs  Lab 09/01/23 1201 09/01/23 1626  GLUCAP 166* 176*   Lipid Profile: No results for input(s): CHOL, HDL, LDLCALC, TRIG, CHOLHDL, LDLDIRECT in the last 72 hours. Thyroid Function Tests: No results for input(s): TSH, T4TOTAL, FREET4, T3FREE, THYROIDAB in the last 72 hours. Anemia Panel: No results for input(s): VITAMINB12, FOLATE, FERRITIN, TIBC, IRON, RETICCTPCT in the last 72 hours.  Sepsis Labs: Recent Labs  Lab 09/06/23 1240  PROCALCITON 0.11     Recent Results (from the past 240 hours)  Expectorated Sputum Assessment w Gram Stain, Rflx to Resp Cult     Status: None   Collection Time: 08/28/23  1:28 PM   Specimen: Expectorated Sputum  Result Value Ref Range Status   Specimen Description EXPECTORATED SPUTUM  Final   Special Requests NONE  Final   Sputum evaluation   Final    THIS SPECIMEN IS ACCEPTABLE FOR SPUTUM CULTURE Performed at [facility], [address], [place], [place] [address]    Report Status 08/28/2023 FINAL  Final  MTB-RIF NAA with AFB Culture, sputum (q8 x 3)     Status: None (Preliminary result)   Collection Time: 08/28/23  1:28 PM   Specimen: Sputum  Result Value Ref Range Status   Myco tuberculosis Complex NOT DETECTED NOT DETECTED Final   Rifampin Not applicable NOT DETECTED Final   AFB Specimen Processing Concentration  Final    Comment: (NOTE) Performed At: [facility] [address] [place], [place] [id number] Jennette Shorter MD Ey:[id number]    Acid Fast Culture PENDING  Incomplete    Source (MTB RIF)  SPU  Final    Comment: Performed at [facility], [address] [place], [place] [address]  Culture, Respiratory w Gram Stain     Status: None   Collection Time: 08/28/23  1:28 PM  Result Value Ref Range Status   Specimen Description EXPECTORATED SPUTUM  Final   Special Requests NONE Reflexed from [phone]  Final   Gram Stain   Final    ABUNDANT WBC PRESENT, PREDOMINANTLY PMN ABUNDANT GRAM POSITIVE COCCI IN PAIRS FEW GRAM POSITIVE RODS RARE GRAM NEGATIVE RODS    Culture   Final    ABUNDANT STREPTOCOCCUS CONSTELLATUS SUSCEPTIBILITIES PERFORMED ON PREVIOUS CULTURE WITHIN THE LAST 5 DAYS. Performed at [facility], [address], [place], [place] [address]    Report Status 08/31/2023 FINAL  Final  MRSA Next Gen by PCR, Nasal     Status: None   Collection Time: 08/28/23  2:57 PM   Specimen: Nasal Mucosa; Nasal Swab  Result Value Ref Range Status   MRSA by PCR Next Gen NOT DETECTED NOT DETECTED Final    Comment: (NOTE) The GeneXpert MRSA Assay (FDA approved for NASAL specimens only), is one component of a comprehensive MRSA colonization surveillance program. It is not intended to diagnose MRSA infection nor to guide or monitor treatment for MRSA infections. Test performance is not FDA approved in patients less than 79 years old. Performed at [facility], [address] [place], [place] [address]   MTB-RIF NAA with AFB Culture, sputum (q8 x 3)     Status: None (Preliminary result)   Collection Time: 08/28/23  9:28 PM   Specimen: Sputum  Result Value Ref Range Status   Myco tuberculosis Complex NOT DETECTED NOT DETECTED Final   Rifampin Not applicable NOT DETECTED Final   AFB Specimen Processing Concentration  Final    Comment: (NOTE) Performed At: [facility] [address] [place], [place] [id number] Jennette Shorter MD Ey:[phone]    Acid Fast Culture PENDING  Incomplete   Source (MTB RIF) SPU  Final    Comment: Performed at  [facility], [address] [place], [place] [address]  MTB-RIF NAA with AFB Culture, sputum (q8 x 3)     Status: None (Preliminary result)   Collection Time: 08/28/23  9:30 PM   Specimen: Sputum  Result Value Ref Range Status   Myco tuberculosis Complex NOT DETECTED NOT DETECTED Final   Rifampin Not applicable NOT DETECTED Final   AFB Specimen Processing Concentration  Final    Comment: (NOTE) Performed At: [facility] [address] [place], [place] [id number] Jennette Shorter MD Ey:[phone]    Acid Fast Culture PENDING  Incomplete   Source (MTB RIF) SPUTUM  Final    Comment: Performed at [facility], [address], [place], [place] [address]  MRSA Next Gen by PCR, Nasal     Status: None   Collection Time: 09/06/23 12:07 PM   Specimen: Nasal Mucosa; Nasal Swab  Result Value Ref Range Status   MRSA by PCR Next Gen NOT DETECTED NOT DETECTED Final    Comment: (NOTE) The GeneXpert MRSA Assay (FDA approved for NASAL specimens only), is one component of a comprehensive MRSA colonization surveillance program. It is not intended to diagnose MRSA infection nor to guide or monitor treatment for MRSA infections. Test performance is not FDA approved in patients less than 38 years old. Performed at [facility], [address], [place], [place] [address]  Radiology Studies: CT CHEST WO CONTRAST Result Date: 09/06/2023 CLINICAL DATA:  Pneumonia EXAM: CT CHEST WITHOUT CONTRAST TECHNIQUE: Multidetector CT imaging of the chest was performed following the standard protocol without IV contrast. RADIATION DOSE REDUCTION: This exam was performed according to the departmental dose-optimization program which includes automated exposure control, adjustment of the mA and/or kV according to patient size and/or use of iterative reconstruction technique. COMPARISON:  Chest x-ray 09/06/2023, CT chest 08/27/2023, 08/29/2021, 09/07/2022 FINDINGS: Cardiovascular: Limited  evaluation without intravenous contrast. Moderate aortic atherosclerosis. No aneurysm. Coronary vascular calcification. Normal cardiac size. Trace pericardial effusion. Mediastinum/Nodes: Patent trachea. No suspicious thyroid mass. Mild mediastinal lymph nodes as before. Low right paratracheal node measuring 12 mm, previously 12 mm. Esophagus shows small amount of debris within the mid lumen. Small hiatal hernia and mild circumferential distal esophageal thickening Lungs/Pleura: Emphysema. Extensive airspace disease and consolidation in the right upper lobe with slightly progressive foci of cavitation in the right upper lobe consolidation. Slightly distorted appearance of consolidation on series 4, image 38. Heterogeneous consolidation and density in the right middle lobe. Progressive heterogeneous consolidation and airspace disease in the right lower lobe. New small right greater than left pleural effusions. Upper Abdomen: No acute finding in the upper abdomen Musculoskeletal: No acute osseous abnormality IMPRESSION: 1. Extensive airspace disease and consolidation in the right upper lobe with slightly progressive foci of cavitation in the right upper lobe consolidation suggestive of necrosis. Slightly distorted appearance of right upper lobe consolidation, underlying right upper lobe lung mass is not excluded and imaging follow-up to resolution is recommended. Significant progression of airspace disease in the right lower lobe, suspicious for pneumonia. 2. New small right greater than left pleural effusions. 3. Aortic atherosclerosis. Aortic Atherosclerosis (ICD10-I70.0) and Emphysema (ICD10-J43.9). Electronically Signed   By: Luke Bun M.D.   On: 09/06/2023 23:23   DG CHEST PORT 1 VIEW Result Date: 09/06/2023 CLINICAL DATA:  Pneumonia. EXAM: PORTABLE CHEST 1 VIEW COMPARISON:  August 30, 2023.  August 27, 2023. FINDINGS: The heart size and mediastinal contours are within normal limits. Left lung is clear.  Diffuse airspace and interstitial opacities are noted throughout the lung which are increased compared to prior exam most consistent with pneumonia. Small right pleural effusion may be present. The visualized skeletal structures are unremarkable. IMPRESSION: Increased right lung opacity is noted diffusely concerning for worsening right-sided pneumonia. Small right pleural effusion may be present. Electronically Signed   By: Lynwood Landy Raddle M.D.   On: 09/06/2023 13:15    Scheduled Meds:  amiodarone   200 mg Oral Daily   apixaban   5 mg Oral BID   arformoterol   15 mcg Nebulization BID   atorvastatin   40 mg Oral Daily   Chlorhexidine  Gluconate Cloth  6 each Topical Daily   feeding supplement  237 mL Oral BID BM   metoprolol  tartrate  25 mg Oral BID   nicotine   21 mg Transdermal Daily   mouth rinse  15 mL Mouth Rinse 4 times per day   pantoprazole   80 mg Oral Q1200   revefenacin   175 mcg Nebulization Daily   sodium chloride  flush  10-40 mL Intracatheter Q12H   thiamine   100 mg Oral Daily   Continuous Infusions:  linezolid  (ZYVOX ) IV 600 mg (09/07/23 0845)   meropenem  (MERREM ) IV 1 g (09/06/23 2345)   sulfamethoxazole -trimethoprim  404 mg of trimethoprim  (09/07/23 0842)      LOS: 11 days   Fredia Skeeter, MD [facility]  09/07/2023, 8:46 AM   *Please note  that this is a verbal dictation therefore any spelling or grammatical errors are due to the Dragon Medical One system interpretation.  Please page via Amion and do not message via secure chat for urgent patient care matters. Secure chat can be used for non urgent patient care matters.  How to contact the [facility] Attending or Consulting provider 7A - 7P or covering provider during after hours 7P -7A, for this patient?  Check the care team in [facility] and look for a) attending/consulting TR[facility] provider listed and b) the [facility] team listed. Page or secure chat 7A-7P. Log into [email or web address] and use [place]'s universal password to access. If  you do not have the password, please contact the hospital operator. Locate the [facility] provider you are looking for under [facility] and page to a number that you can be directly reached. If you still have difficulty reaching the provider, please page the [facility] (Director on Call) for the Hospitalists listed on amion for assistance.

## 2023-09-07 NOTE — Procedures (Signed)
 Cortrak  Person Inserting Tube:  Donal Mutton, RD Tube Type:  Cortrak - 43 inches Tube Size:  10 Tube Location:  Left nare Secured by: Bridle Technique Used to Measure Tube Placement:  Marking at nare/corner of mouth Cortrak Secured At:  82 cm   Cortrak Tube Team Note:  Consult received to place a Cortrak feeding tube.   Post-pyloric placement requested; pt did not tolerate procedure well, reporting pain throughout the procedure, wanting Cortrak RD to stop.  O2 stats stable throughout the procedure.   Xray required with confirmation of correct placement prior to use. Bridle left with slack given pt currently on HHFNC   If the tube becomes dislodged please keep the tube and contact the Cortrak team at [email or web address] for replacement.  If after hours and replacement cannot be delayed, place a NG tube and confirm placement with an abdominal x-ray.    Mutton Donal MS, RDN, LDN, CNSC Registered Dietitian 3 Clinical Nutrition RD Inpatient Contact Info in Amion

## 2023-09-07 NOTE — Anesthesia Preprocedure Evaluation (Signed)
 Anesthesia Evaluation  Patient identified by MRN, date of birth, ID band Patient awake    Reviewed: Allergy & Precautions, H&P , NPO status , Patient's Chart, lab work & pertinent test results  Airway Mallampati: II   Neck ROM: full    Dental   Pulmonary COPD, Current Smoker   breath sounds clear to auscultation       Cardiovascular hypertension, + CAD and +CHF  + dysrhythmias Atrial Fibrillation  Rhythm:regular Rate:Normal     Neuro/Psych  PSYCHIATRIC DISORDERS Anxiety        GI/Hepatic ,GERD  ,,  Endo/Other    Renal/GU      Musculoskeletal  (+) Arthritis ,    Abdominal   Peds  Hematology   Anesthesia Other Findings   Reproductive/Obstetrics                              Anesthesia Physical Anesthesia Plan  ASA: 3  Anesthesia Plan: General   Post-op Pain Management:    Induction: Intravenous  PONV Risk Score and Plan: 1 and Ondansetron , Dexamethasone , Midazolam  and Treatment may vary due to age or medical condition  Airway Management Planned: Oral ETT  Additional Equipment:   Intra-op Plan:   Post-operative Plan: Extubation in OR  Informed Consent: I have reviewed the patients History and Physical, chart, labs and discussed the procedure including the risks, benefits and alternatives for the proposed anesthesia with the patient or authorized representative who has indicated his/her understanding and acceptance.     Dental advisory given  Plan Discussed with: CRNA, Anesthesiologist and Surgeon  Anesthesia Plan Comments:         Anesthesia Quick Evaluation

## 2023-09-07 NOTE — Op Note (Signed)
 Bronchoscopy Procedure Note  Mayur Duman  [id number]  12/13/1960  Date:09/07/23  Time:11:33 AM   Provider Performing:Halleigh Comes B Kayani Rapaport   Procedure(s):  Flexible bronchoscopy with bronchial alveolar lavage (68375)  Indication(s) Pneumonia   Consent Risks of the procedure as well as the alternatives and risks of each were explained to the patient and/or caregiver.  Consent for the procedure was obtained and is signed in the bedside chart  Anesthesia General   Time Out Verified patient identification, verified procedure, site/side was marked, verified correct patient position, special equipment/implants available, medications/allergies/relevant history reviewed, required imaging and test results available.   Sterile Technique Usual hand hygiene, masks, gowns, and gloves were used   Procedure Description Bronchoscope advanced through endotracheal tube and into airway.  Airways were examined down to subsegmental level with findings noted below.   Following diagnostic evaluation, BAL(s) performed in RUL with normal saline and return of 35mL fluid  Findings:  - Normal appearing airways - Yellowish thin secretions suctioned out of bilateral lower lobes - Otherwise no significant thick secretions or erythema of airways, which is more concerning for a parenchymal process given his imaging   Complications/Tolerance None; patient tolerated the procedure well. Chest X-ray is not needed post procedure.   EBL Minimal   Specimen(s) RUL BAL for cytology, respiratory/AFB/Fungal culture

## 2023-09-07 NOTE — Anesthesia Procedure Notes (Signed)
 Procedure Name: Intubation Date/Time: 09/07/2023 11:21 AM  Performed by: Cindie Donald CROME, CRNAPre-anesthesia Checklist: Patient identified, Emergency Drugs available, Suction available and Patient being monitored Patient Re-evaluated:Patient Re-evaluated prior to induction Oxygen Delivery Method: Circle System Utilized Preoxygenation: Pre-oxygenation with 100% oxygen Induction Type: IV induction Ventilation: Mask ventilation without difficulty Laryngoscope Size: Mac and 4 Grade View: Grade I Tube type: Oral Tube size: 8.0 mm Number of attempts: 1 Airway Equipment and Method: Stylet Placement Confirmation: ETT inserted through vocal cords under direct vision, positive ETCO2 and breath sounds checked- equal and bilateral Secured at: 23 cm Tube secured with: Tape Dental Injury: Teeth and Oropharynx as per pre-operative assessment

## 2023-09-07 NOTE — Progress Notes (Signed)
 OT Cancellation Note  Patient Details Name: Payson Evrard MRN: [id number] DOB: [birth year]   Cancelled Treatment:    Reason Eval/Treat Not Completed: Medical issues which prohibited therapy (Pt transferred to ICU due to medical decline. Will follow up next week.)  Domani Bakos,HILLARY 09/07/2023, 2:21 PM Kreg Sink, OT/L   Acute OT Clinical Specialist Acute Rehabilitation Services Pager [phone] Office [phone]

## 2023-09-07 NOTE — Progress Notes (Signed)
 PT Cancellation Note  Patient Details Name: Akshar Starnes MRN: [id number] DOB: December 09, 1960   Cancelled Treatment:    Reason Eval/Treat Not Completed: Medical issues which prohibited therapy;Patient at procedure or test/unavailable (Pt down for bronchoscopy and not available. Pt with increasing O2 needs today; transferred to ICU pending possible need for intubation per MD note. Will hold physical therapy at this time and follow up as able and appropriate.)  Dorothyann Maier, DPT, CLT  Acute Rehabilitation Services Office: [phone] (Secure chat preferred)   Dorothyann VEAR Maier 09/07/2023, 1:43 PM

## 2023-09-07 NOTE — Progress Notes (Signed)
 NAME:  [name], MRN:  [id number], DOB:  Apr 26, 1960, LOS: 11 ADMISSION DATE:  08/27/2023, CONSULTATION DATE:  7/15 REFERRING MD:  TRH, CHIEF COMPLAINT:  hypotension    History of Present Illness:  63yo male smoker with hx HTN, COPD, PAF on eliquis , NICM, HFpEF presented 7/14 with 1 week progressive SOB, cough, malaise, poor appetite. On EMS arrival with hypoxic with sats 80% on RA. In ER initially BP was soft with SBP 80's, tachy 130's but responded well to 1L fluids. CXR revealed dense RUL PNA. He was admitted by TRH, started on IV rocephin /azithro. On 7/15 he was still boarding in ER and again having hypotension with SBP 80's and PCCM was consulted.  Lactate 1.4 WBC 34  Pertinent  Medical History   has a past medical history of Anxiety, Arthritis, Cardiomyopathy (HCC), CHF (congestive heart failure) (HCC), COPD (chronic obstructive pulmonary disease) (HCC), Dysphagia, Dysrhythmia, GERD (gastroesophageal reflux disease), History of blood transfusion, and Hypertension.   Significant Hospital Events: Including procedures, antibiotic start and stop dates in addition to other pertinent events   CT chest 7/14>> Extensive airspace consolidation in the right upper lobe with lesser changes in the right middle and lower lobes consistent with multifocal infiltrate. Short-term follow-up following appropriate therapy is recommended to assess for resolution. Echo 7/15>>>LVEF 55-60% no WMA, LA dilated 7/17 Worsening resp status this am, feels like he cannot catch breath, anxious as well. Got lasix , HHFO2, dex for restlessness.BLEUS neg for DVT 7/18 breathing better 70% FiO2 on heated high flow adding additional pulmonary hygiene measures 7/24 PCCM re-consulted due to increasing O2 needs and worsening chest x-ray  Interim History / Subjective:   He remains short of breath, more so today. He is aware of risks of remaining intubated post procedure. Not coughing up much mucous  Objective    Blood  pressure 95/60, pulse (!) 48, temperature 97.6 F (36.4 C), temperature source Oral, resp. rate (!) 21, height 5' 9 (1.753 m), weight 80.8 kg, SpO2 93%.    FiO2 (%):  [100 %] 100 %   Intake/Output Summary (Last 24 hours) at 09/07/2023 1046 Last data filed at 09/07/2023 0537 Gross per 24 hour  Intake 2834.51 ml  Output 875 ml  Net 1959.51 ml   Filed Weights   08/28/23 1716 08/30/23 0255  Weight: 77.8 kg 80.8 kg    Examination: General elderly male, no acute distress HEENT normocephalic atraumatic no jugular venous distention is appreciated Pulmonary: crackles on right, no wheezing Cardiac: Regular rate and rhythm without murmur rub or gallop Abdomen: Soft nontender Extremities: Warm dry brisk cap refill Neuro: Awake oriented no focal deficits.  Resolved problem list  Septic shock Assessment and Plan   Acute respiratory failure 2/2 strep constellatus PNA vs HCAP COPD  Tobacco abuse Plan - strep constellatus was intermediate to penicillins, completed course of Zosyn  with notable progression on chest x-ray and increasing O2 needs - Continue linezolid , meropenem  and bactrim . Covering for HCAP and stenotrophomonas/nocardia. - Unable to obtain sputum culture - Plan for bronchoscopy today for BAL and culture samples - f/u fungitell - Wean Oxygen for goal SpO2 92% or higher - continue brovana  and yupelri  nebulizer treatments.  Hx NICM/HFpEF and CAD Plan - BNP elevated - give lasix , he is 5L+ for the admission  CKD 3  - trend Cr   Dorn Chill, MD [facility] Office: [phone]   See Amion for personal pager PCCM on call pager [phone] until 7pm. Please call Elink 7p-7a. [phone]

## 2023-09-07 NOTE — Progress Notes (Signed)
 PCCM Update:  Patient with increasing oxygen needs since this morning, will transfer him to the ICU post-bronchoscopy for close monitoring in case he requires intubation.  Dorn Chill, MD  Pulmonary & Critical Care Office: [phone]   See Amion for personal pager PCCM on call pager [phone] until 7pm. Please call Elink 7p-7a. [phone]

## 2023-09-07 NOTE — TOC Progression Note (Signed)
 Transition of Care [facility]) - Progression Note    Patient Details  Name: [name] MRN: [id number] Date of Birth: 1960-02-29  Transition of Care [facility]) CM/SW Contact  Corean JAYSON Canary, RN Phone Number: 09/07/2023, 3:43 PM  Clinical Narrative:    Patient transferred to the ICU after bronchoscopy for increasing oxygen demand. The patient lives alone and was still driving. May need SNF as a bridge to home PT will evaluate patient when more medically stable TOC will follow  for needs, recommendations, and transitions of care.   Expected Discharge Plan: Home w Home Health Services Barriers to Discharge: Continued Medical Work up               Expected Discharge Plan and Services In-house Referral: NA Discharge Planning Services: CM Consult Post Acute Care Choice: Durable Medical Equipment, Home Health Living arrangements for the past 2 months: Mobile Home                   DME Agency: NA       HH Arranged: PT, OT HH Agency: [facility] Date [facility] Agency Contacted: 09/06/23 Time HH Agency Contacted: 1414 Representative spoke with at [facility] Agency: Amy   Social Drivers of Health (SDOH) Interventions SDOH Screenings   Food Insecurity: No Food Insecurity (08/28/2023)  Housing: Low Risk  (08/28/2023)  Transportation Needs: No Transportation Needs (08/28/2023)  Utilities: Not At Risk (08/28/2023)  Tobacco Use: High Risk (09/07/2023)    Readmission Risk Interventions    08/30/2023    1:18 PM  Readmission Risk Prevention Plan  Transportation Screening Complete  HRI or Home Care Consult Complete  Social Work Consult for Recovery Care Planning/Counseling Complete  Palliative Care Screening Not Applicable  Medication Review Oceanographer) Referral to Pharmacy

## 2023-09-07 NOTE — Plan of Care (Signed)
  Problem: Education: Goal: Knowledge of General Education information will improve Description: Including pain rating scale, medication(s)/side effects and non-pharmacologic comfort measures Outcome: Progressing   Problem: Health Behavior/Discharge Planning: Goal: Ability to manage health-related needs will improve Outcome: Progressing   Problem: Clinical Measurements: Goal: Respiratory complications will improve Outcome: Progressing   Problem: Clinical Measurements: Goal: Cardiovascular complication will be avoided Outcome: Progressing   Problem: Activity: Goal: Risk for activity intolerance will decrease Outcome: Progressing   Problem: Nutrition: Goal: Adequate nutrition will be maintained Outcome: Progressing   Problem: Coping: Goal: Level of anxiety will decrease Outcome: Progressing   Problem: Pain Managment: Goal: General experience of comfort will improve and/or be controlled Outcome: Progressing   Problem: Skin Integrity: Goal: Risk for impaired skin integrity will decrease Outcome: Progressing   Problem: Activity: Goal: Ability to tolerate increased activity will improve Outcome: Progressing

## 2023-09-07 NOTE — Progress Notes (Signed)
 RT assisted with patient transport NRB / HHFNC from PACU to 3M11 without complications.

## 2023-09-07 NOTE — Progress Notes (Signed)
 Initial Nutrition Assessment  DOCUMENTATION CODES:  Not applicable  INTERVENTION:  Once cortrak in place, recommend the following: Osmolite 1.5 at 55 ml/h (1320 ml per day) Start at 25 and advance by 10mL every 8 hours to reach goal Prosource TF20 60 ml 1x/d Provides 2060 kcal, 103 gm protein, 1006 ml free water  daily Pt is at risk for refeeding syndrome given prolonged poor PO intake. Monitor magnesium and phosphorus daily x 3 days, MD to replete as needed. 100mg  thiamine  x 5 days MVI with minerals daily   NUTRITION DIAGNOSIS:   Inadequate oral intake related to inability to eat as evidenced by NPO status.  GOAL:   Patient will meet greater than or equal to 90% of their needs  MONITOR:   TF tolerance, Diet advancement, I & O's, Labs  REASON FOR ASSESSMENT:   Consult Enteral/tube feeding initiation and management  ASSESSMENT:  Pt with hx of former alcohol abuse and tobacco abuse, CHF, COPD, HTN, HLD, and atrial fibrillation presented to ED with worsening SOB and poor appetite. Found to be septic on admission related to PNA.  7/14 - presented to ED 7/15 - admitted to [facility], SLP evaluation (regular, thin) 7/19 - transferred to 4E 7/24 - SLE BSE, DYS3/thin 7/25 - bronchoscopy, transferred to ICU post-procedure  Pt underwent bronch this AM. Transferred to ICU after procedure for closer monitoring as O2 needs have increased. Currently NPO. RN reports cortrak and enteral feeds are requested.   SLP has been following to perform MBS when able as pt has a hx of esophageal dysphagia and has required dilations in the past.   Overall poor PO intake this admission and pt also reported poor PO intake PTA. Will be at risk for refeeding.   Will follow-up in person for physical exam and nutrition hx.   Admit weight: 77.8 kg   Current weight: 80.8 kg   Intake/Output Summary (Last 24 hours) at 09/07/2023 1420 Last data filed at 09/07/2023 1150 Gross per 24 hour  Intake 3074.51 ml   Output 625 ml  Net 2449.51 ml  Net IO Since Admission: 6,097.2 mL [09/07/23 1420]  Drains/Lines: Midline single lumen  Average Meal Intake: 7/18-7/24: 33% intake x 8 recorded meals  Nutritionally Relevant Medications: Scheduled Meds:  atorvastatin   40 mg Oral Daily   Ensure High Protein  237 mL Oral BID BM   pantoprazole   80 mg Oral Q1200   thiamine   100 mg Oral Daily   Continuous Infusions:  linezolid  (ZYVOX ) IV 600 mg (09/07/23 0845)   meropenem  (MERREM ) IV 1 g (09/07/23 0854)   sulfamethoxazole -trimethoprim  404 mg of trimethoprim  (09/07/23 0842)   PRN Meds: polyethylene glycol  Labs Reviewed: CBG ranges from 63-176 mg/dL over the last 24 hours HgbA1c 5.6%  NUTRITION - FOCUSED PHYSICAL EXAM: Defer to in-person assessment  Diet Order:   Diet Order     None       EDUCATION NEEDS:   Not appropriate for education at this time  Skin:  Skin Assessment: Reviewed RN Assessment  Last BM:  7/23 - type 4  Height:  Ht Readings from Last 1 Encounters:  08/27/23 5' 9 (1.753 m)    Weight:  Wt Readings from Last 1 Encounters:  08/30/23 80.8 kg    Ideal Body Weight:  72.7 kg  BMI:  Body mass index is 26.31 kg/m.  Estimated Nutritional Needs:  Kcal:  2000-2200 kcal/d Protein:  100-120 g/d Fluid:  2-2.2L/d    [name], RD, LDN, CNSC Registered Dietitian II  Please reach out via secure chat

## 2023-09-07 NOTE — Transfer of Care (Signed)
 Immediate Anesthesia Transfer of Care Note  Patient: [name]  Procedure(s) Performed: BRONCHOSCOPY, FLEXIBLE (Bilateral)  Patient Location: PACU  Anesthesia Type:General  Level of Consciousness: awake, drowsy, and patient cooperative  Airway & Oxygen Therapy: Patient Spontanous Breathing and Patient connected to face mask oxygen  Post-op Assessment: Report given to RN and Post -op Vital signs reviewed and stable  Post vital signs: Reviewed and stable with RT at bedside to connect high flow nasal cannula O2  Last Vitals:  Vitals Value Taken Time  BP 126/72 09/07/23 12:00  Temp    Pulse 52 09/07/23 12:01  Resp 25 09/07/23 12:01  SpO2 91 % 09/07/23 12:01  Vitals shown include unfiled device data.  Last Pain:  Vitals:   09/07/23 1054  TempSrc: Temporal  PainSc: 0-No pain      Patients Stated Pain Goal: 3 (09/07/23 0547)  Complications: No notable events documented.

## 2023-09-08 ENCOUNTER — Inpatient Hospital Stay (HOSPITAL_COMMUNITY)

## 2023-09-08 ENCOUNTER — Encounter (HOSPITAL_COMMUNITY): Payer: Self-pay | Admitting: Pulmonary Disease

## 2023-09-08 DIAGNOSIS — R6521 Severe sepsis with septic shock: Secondary | ICD-10-CM | POA: Diagnosis not present

## 2023-09-08 DIAGNOSIS — A419 Sepsis, unspecified organism: Secondary | ICD-10-CM | POA: Diagnosis not present

## 2023-09-08 DIAGNOSIS — J189 Pneumonia, unspecified organism: Secondary | ICD-10-CM | POA: Diagnosis not present

## 2023-09-08 LAB — BASIC METABOLIC PANEL WITH GFR
Anion gap: 6 (ref 5–15)
BUN: 28 mg/dL — ABNORMAL HIGH (ref 8–23)
CO2: 28 mmol/L (ref 22–32)
Calcium: 8.1 mg/dL — ABNORMAL LOW (ref 8.9–10.3)
Chloride: 100 mmol/L (ref 98–111)
Creatinine, Ser: 0.96 mg/dL (ref 0.61–1.24)
GFR, Estimated: >60 mL/min (ref >60)
Glucose, Bld: 188 mg/dL — ABNORMAL HIGH (ref 70–99)
Potassium: 5 mmol/L (ref 3.5–5.1)
Sodium: 134 mmol/L — ABNORMAL LOW (ref 135–145)

## 2023-09-08 LAB — LACTIC ACID, PLASMA: Lactic Acid, Venous: 1.5 mmol/L (ref 0.5–1.9)

## 2023-09-08 LAB — MAGNESIUM: Magnesium: 2.3 mg/dL (ref 1.7–2.4)

## 2023-09-08 LAB — GLUCOSE, CAPILLARY
Glucose-Capillary: 222 mg/dL — ABNORMAL HIGH (ref 70–99)
Glucose-Capillary: 136 mg/dL — ABNORMAL HIGH (ref 70–99)
Glucose-Capillary: 181 mg/dL — ABNORMAL HIGH (ref 70–99)
Glucose-Capillary: 131 mg/dL — ABNORMAL HIGH (ref 70–99)
Glucose-Capillary: 132 mg/dL — ABNORMAL HIGH (ref 70–99)

## 2023-09-08 LAB — CBC
HCT: 31.6 % — ABNORMAL LOW (ref 39.0–52.0)
Hemoglobin: 10.2 g/dL — ABNORMAL LOW (ref 13.0–17.0)
MCH: 27.2 pg (ref 26.0–34.0)
MCHC: 32.3 g/dL (ref 30.0–36.0)
MCV: 84.3 fL (ref 80.0–100.0)
Platelets: 246 K/uL (ref 150–400)
RBC: 3.75 MIL/uL — ABNORMAL LOW (ref 4.22–5.81)
RDW: 18.8 % — ABNORMAL HIGH (ref 11.5–15.5)
WBC: 15.5 K/uL — ABNORMAL HIGH (ref 4.0–10.5)
nRBC: 0 % (ref 0.0–0.2)

## 2023-09-08 LAB — HEPATIC FUNCTION PANEL
ALT: 42 U/L (ref 0–44)
AST: 25 U/L (ref 15–41)
Albumin: 2.9 g/dL — ABNORMAL LOW (ref 3.5–5.0)
Alkaline Phosphatase: 74 U/L (ref 38–126)
Bilirubin, Direct: 0.1 mg/dL (ref 0.0–0.2)
Indirect Bilirubin: 0.3 mg/dL (ref 0.3–0.9)
Total Bilirubin: 0.4 mg/dL (ref 0.0–1.2)
Total Protein: 5.4 g/dL — ABNORMAL LOW (ref 6.5–8.1)

## 2023-09-08 LAB — PHOSPHORUS: Phosphorus: 2.9 mg/dL (ref 2.5–4.6)

## 2023-09-08 MED ORDER — HYDROXYZINE HCL 25 MG PO TABS
25.0000 mg | ORAL_TABLET | Freq: Three times a day (TID) | ORAL | Status: DC | PRN
  Administered 2023-09-08 – 2023-09-19 (×10): 25 mg
  Filled 2023-09-08 (×10): qty 1

## 2023-09-08 MED ORDER — INSULIN ASPART 100 UNIT/ML IJ SOLN
2.0000 [IU] | INTRAMUSCULAR | Status: DC
  Administered 2023-09-08 – 2023-09-09 (×7): 2 [IU] via SUBCUTANEOUS

## 2023-09-08 MED ORDER — CARMEX CLASSIC LIP BALM EX OINT
TOPICAL_OINTMENT | CUTANEOUS | Status: DC | PRN
  Filled 2023-09-08: qty 10

## 2023-09-08 MED ORDER — HYDROXYZINE HCL 25 MG PO TABS
25.0000 mg | ORAL_TABLET | Freq: Three times a day (TID) | ORAL | Status: DC | PRN
  Administered 2023-09-08: 25 mg via ORAL
  Filled 2023-09-08: qty 1

## 2023-09-08 MED ORDER — DIAZEPAM 5 MG PO TABS
5.0000 mg | ORAL_TABLET | Freq: Two times a day (BID) | ORAL | Status: DC | PRN
  Administered 2023-09-09 – 2023-09-22 (×15): 5 mg
  Filled 2023-09-08 (×15): qty 1

## 2023-09-08 MED ORDER — INSULIN ASPART 100 UNIT/ML IJ SOLN
0.0000 [IU] | INTRAMUSCULAR | Status: DC
  Administered 2023-09-08: 5 [IU] via SUBCUTANEOUS
  Administered 2023-09-08: 2 [IU] via SUBCUTANEOUS
  Administered 2023-09-08: 3 [IU] via SUBCUTANEOUS
  Administered 2023-09-08 – 2023-09-09 (×4): 2 [IU] via SUBCUTANEOUS

## 2023-09-08 NOTE — Progress Notes (Signed)
 Pt O2 sat at this time was 100% RT titrated HHFNC according to sats. Pt tolerating well at this time

## 2023-09-08 NOTE — Progress Notes (Signed)
 NAME:  Roxy Filler, MRN:  [id number], DOB:  1960/06/10, LOS: 12 ADMISSION DATE:  08/27/2023, CONSULTATION DATE:  7/15 REFERRING MD:  TRH, CHIEF COMPLAINT:  hypotension    History of Present Illness:  63yo male smoker with hx HTN, COPD, PAF on eliquis , NICM, HFpEF presented 7/14 with 1 week progressive SOB, cough, malaise, poor appetite. On EMS arrival with hypoxic with sats 80% on RA. In ER initially BP was soft with SBP 80's, tachy 130's but responded well to 1L fluids. CXR revealed dense RUL PNA. He was admitted by TRH, started on IV rocephin /azithro. On 7/15 he was still boarding in ER and again having hypotension with SBP 80's and PCCM was consulted.  Lactate 1.4 WBC 34  Pertinent  Medical History   has a past medical history of Anxiety, Arthritis, Cardiomyopathy (HCC), CHF (congestive heart failure) (HCC), COPD (chronic obstructive pulmonary disease) (HCC), Dysphagia, Dysrhythmia, GERD (gastroesophageal reflux disease), History of blood transfusion, and Hypertension.   Significant Hospital Events: Including procedures, antibiotic start and stop dates in addition to other pertinent events   CT chest 7/14>> Extensive airspace consolidation in the right upper lobe with lesser changes in the right middle and lower lobes consistent with multifocal infiltrate. Short-term follow-up following appropriate therapy is recommended to assess for resolution. Echo 7/15>>>LVEF 55-60% no WMA, LA dilated 7/17 Worsening resp status this am, feels like he cannot catch breath, anxious as well. Got lasix , HHFO2, dex for restlessness.BLEUS neg for DVT 7/18 breathing better 70% FiO2 on heated high flow adding additional pulmonary hygiene measures 7/24 PCCM re-consulted due to increasing O2 needs and worsening chest x-ray 7/25 bronched. Txf to ICU post case w incr O2. Cortrak. RUQ US    7/26 weaning O2  Interim History / Subjective:   Weaning O2   Overnight c/o pain and anxiety about pain   WBC down to  16   Objective    Blood pressure 105/63, pulse (!) 50, temperature (!) 97.5 F (36.4 C), temperature source Oral, resp. rate 16, height 5' 9 (1.753 m), weight 91.3 kg, SpO2 94%.    FiO2 (%):  [60 %-100 %] 60 %   Intake/Output Summary (Last 24 hours) at 09/08/2023 1102 Last data filed at 09/08/2023 0900 Gross per 24 hour  Intake 4187.1 ml  Output 775 ml  Net 3412.1 ml   Filed Weights   08/28/23 1716 08/30/23 0255 09/08/23 0500  Weight: 77.8 kg 80.8 kg 91.3 kg    Examination: General: chronically ill and acutely ill M HEENT NCAT HFNC and cortrak Pulmonary: R crackles  Cardiac: rr  Abdomen: soft ndnt  Extremities: cap refill <  3 sec  Neuro: Asleep, awakens easily, following commands   Resolved problem list  Septic shock Assessment and Plan   Hypotension v septic shock  -looks like somewhat variable BP this admission. Started low dose periph pressors 7/25, ?medication related side effects v septic shock  P -periph NE -check LA -abx as below   -limit CNS depressing meds -defer diuresis 7/26   Acute respiratory failure -R PNA-- strep constellatus PNA v HCAP, aspiration COPD Tobacco use  Plan -abx escalated to linezolid , mero bactrim . Covering for steno [place]cordia strep constellatus(was intermediate to PCNs and tx w zosyn ), HCAP  -follow bal, fungitell  -cont nebs -wean O2 as tolerated -pulm hygiene, mobility  -he declines nicotine  patch, will dc order   Hx NICM/HFpEF and CAD Hx pAfib  Plan -dc metop  -defer diuresis w low dose pressor initiation  -amio, eliquis   Elevated LFTs  -RUQ US  w dilated GB and dilated CBD and mildly dilated intrahepatic bile duct  -MRCP v ERCP was recommended P -will consult GI -repeat hepatic fxn panel   CKD 3  - trend Cr   CRITICAL CARE Performed by: Ronnald FORBES Gave  Total critical care time: 37 minutes  Critical care time was exclusive of separately billable procedures and treating other patients. Critical care was  necessary to treat or prevent imminent or life-threatening deterioration.  Critical care was time spent personally by me on the following activities: development of treatment plan with patient and/or surrogate as well as nursing, discussions with consultants, evaluation of patient's response to treatment, examination of patient, obtaining history from patient or surrogate, ordering and performing treatments and interventions, ordering and review of laboratory studies, ordering and review of radiographic studies, pulse oximetry and re-evaluation of patient's condition.  Ronnald Gave MSN, AGACNP-BC [place] [facility] Amion for pager  09/08/2023, 11:02 AM

## 2023-09-09 ENCOUNTER — Inpatient Hospital Stay (HOSPITAL_COMMUNITY)

## 2023-09-09 DIAGNOSIS — A419 Sepsis, unspecified organism: Secondary | ICD-10-CM | POA: Diagnosis not present

## 2023-09-09 DIAGNOSIS — J189 Pneumonia, unspecified organism: Secondary | ICD-10-CM | POA: Diagnosis not present

## 2023-09-09 DIAGNOSIS — R6521 Severe sepsis with septic shock: Secondary | ICD-10-CM | POA: Diagnosis not present

## 2023-09-09 DIAGNOSIS — J9601 Acute respiratory failure with hypoxia: Secondary | ICD-10-CM | POA: Diagnosis not present

## 2023-09-09 LAB — BASIC METABOLIC PANEL WITH GFR
Anion gap: 9 (ref 5–15)
Anion gap: 7 (ref 5–15)
BUN: 32 mg/dL — ABNORMAL HIGH (ref 8–23)
BUN: 38 mg/dL — ABNORMAL HIGH (ref 8–23)
CO2: 28 mmol/L (ref 22–32)
CO2: 30 mmol/L (ref 22–32)
Calcium: 8.3 mg/dL — ABNORMAL LOW (ref 8.9–10.3)
Calcium: 8.1 mg/dL — ABNORMAL LOW (ref 8.9–10.3)
Chloride: 97 mmol/L — ABNORMAL LOW (ref 98–111)
Chloride: 98 mmol/L (ref 98–111)
Creatinine, Ser: 1.08 mg/dL (ref 0.61–1.24)
Creatinine, Ser: 1.24 mg/dL (ref 0.61–1.24)
GFR, Estimated: >60 mL/min (ref >60)
GFR, Estimated: >60 mL/min (ref >60)
Glucose, Bld: 82 mg/dL (ref 70–99)
Glucose, Bld: 113 mg/dL — ABNORMAL HIGH (ref 70–99)
Potassium: 5.7 mmol/L — ABNORMAL HIGH (ref 3.5–5.1)
Potassium: 5.5 mmol/L — ABNORMAL HIGH (ref 3.5–5.1)
Sodium: 134 mmol/L — ABNORMAL LOW (ref 135–145)
Sodium: 135 mmol/L (ref 135–145)

## 2023-09-09 LAB — COMPREHENSIVE METABOLIC PANEL WITH GFR
ALT: 50 U/L — ABNORMAL HIGH (ref 0–44)
AST: 38 U/L (ref 15–41)
Albumin: 2.9 g/dL — ABNORMAL LOW (ref 3.5–5.0)
Alkaline Phosphatase: 77 U/L (ref 38–126)
Anion gap: 8 (ref 5–15)
BUN: 32 mg/dL — ABNORMAL HIGH (ref 8–23)
CO2: 28 mmol/L (ref 22–32)
Calcium: 8.3 mg/dL — ABNORMAL LOW (ref 8.9–10.3)
Chloride: 98 mmol/L (ref 98–111)
Creatinine, Ser: 1.1 mg/dL (ref 0.61–1.24)
GFR, Estimated: >60 mL/min (ref >60)
Glucose, Bld: 113 mg/dL — ABNORMAL HIGH (ref 70–99)
Potassium: 5.5 mmol/L — ABNORMAL HIGH (ref 3.5–5.1)
Sodium: 134 mmol/L — ABNORMAL LOW (ref 135–145)
Total Bilirubin: 0.2 mg/dL (ref 0.0–1.2)
Total Protein: 5.7 g/dL — ABNORMAL LOW (ref 6.5–8.1)

## 2023-09-09 LAB — CULTURE, BAL-QUANTITATIVE W GRAM STAIN
Culture: NO GROWTH
Gram Stain: NONE SEEN

## 2023-09-09 LAB — CBC WITH DIFFERENTIAL/PLATELET
Abs Immature Granulocytes: 0.21 K/uL — ABNORMAL HIGH (ref 0.00–0.07)
Basophils Absolute: 0 K/uL (ref 0.0–0.1)
Basophils Relative: 0 %
Eosinophils Absolute: 0 K/uL (ref 0.0–0.5)
Eosinophils Relative: 0 %
HCT: 28.9 % — ABNORMAL LOW (ref 39.0–52.0)
Hemoglobin: 9.2 g/dL — ABNORMAL LOW (ref 13.0–17.0)
Immature Granulocytes: 1 %
Lymphocytes Relative: 5 %
Lymphs Abs: 0.8 K/uL (ref 0.7–4.0)
MCH: 26.5 pg (ref 26.0–34.0)
MCHC: 31.8 g/dL (ref 30.0–36.0)
MCV: 83.3 fL (ref 80.0–100.0)
Monocytes Absolute: 1 K/uL (ref 0.1–1.0)
Monocytes Relative: 6 %
Neutro Abs: 16.1 K/uL — ABNORMAL HIGH (ref 1.7–7.7)
Neutrophils Relative %: 88 %
Platelets: 217 K/uL (ref 150–400)
RBC: 3.47 MIL/uL — ABNORMAL LOW (ref 4.22–5.81)
RDW: 19 % — ABNORMAL HIGH (ref 11.5–15.5)
WBC: 18.2 K/uL — ABNORMAL HIGH (ref 4.0–10.5)
nRBC: 0 % (ref 0.0–0.2)

## 2023-09-09 LAB — GLUCOSE, CAPILLARY
Glucose-Capillary: 105 mg/dL — ABNORMAL HIGH (ref 70–99)
Glucose-Capillary: 107 mg/dL — ABNORMAL HIGH (ref 70–99)
Glucose-Capillary: 109 mg/dL — ABNORMAL HIGH (ref 70–99)
Glucose-Capillary: 115 mg/dL — ABNORMAL HIGH (ref 70–99)
Glucose-Capillary: 127 mg/dL — ABNORMAL HIGH (ref 70–99)
Glucose-Capillary: 138 mg/dL — ABNORMAL HIGH (ref 70–99)
Glucose-Capillary: 63 mg/dL — ABNORMAL LOW (ref 70–99)
Glucose-Capillary: 97 mg/dL (ref 70–99)

## 2023-09-09 LAB — ACID FAST SMEAR (AFB, MYCOBACTERIA): Acid Fast Smear: NEGATIVE

## 2023-09-09 LAB — MAGNESIUM: Magnesium: 2.2 mg/dL (ref 1.7–2.4)

## 2023-09-09 LAB — POTASSIUM: Potassium: 5.7 mmol/L — ABNORMAL HIGH (ref 3.5–5.1)

## 2023-09-09 LAB — PHOSPHORUS: Phosphorus: 2.7 mg/dL (ref 2.5–4.6)

## 2023-09-09 MED ORDER — PHENOL 1.4 % MT LIQD: 1.0000 | OROMUCOSAL | Status: DC | PRN

## 2023-09-09 MED ORDER — IPRATROPIUM-ALBUTEROL 0.5-2.5 (3) MG/3ML IN SOLN
3.0000 mL | RESPIRATORY_TRACT | Status: DC | PRN
  Administered 2023-09-09: 3 mL via RESPIRATORY_TRACT

## 2023-09-09 MED ORDER — FUROSEMIDE 10 MG/ML IJ SOLN
40.0000 mg | Freq: Once | INTRAMUSCULAR | Status: AC
  Administered 2023-09-09: 40 mg via INTRAVENOUS
  Filled 2023-09-09: qty 4

## 2023-09-09 MED ORDER — DEXTROSE 50 % IV SOLN
1.0000 | Freq: Once | INTRAVENOUS | Status: AC
  Administered 2023-09-09: 50 mL via INTRAVENOUS
  Filled 2023-09-09: qty 50

## 2023-09-09 MED ORDER — SODIUM ZIRCONIUM CYCLOSILICATE 10 G PO PACK
10.0000 g | PACK | Freq: Once | ORAL | Status: AC
  Administered 2023-09-09: 10 g via ORAL
  Filled 2023-09-09: qty 1

## 2023-09-09 MED ORDER — DEXTROSE 50 % IV SOLN: 50.0000 mL | INTRAVENOUS | Status: AC

## 2023-09-09 MED ORDER — INSULIN ASPART 100 UNIT/ML IV SOLN
10.0000 [IU] | Freq: Once | INTRAVENOUS | Status: AC
  Administered 2023-09-09: 10 [IU] via INTRAVENOUS

## 2023-09-09 MED ORDER — CHLORHEXIDINE GLUCONATE CLOTH 2 % EX PADS
6.0000 | MEDICATED_PAD | CUTANEOUS | Status: DC
  Administered 2023-09-10 – 2023-09-25 (×18): 6 via TOPICAL

## 2023-09-09 MED ORDER — IPRATROPIUM-ALBUTEROL 0.5-2.5 (3) MG/3ML IN SOLN
3.0000 mL | RESPIRATORY_TRACT | Status: DC | PRN
  Administered 2023-09-10: 3 mL via RESPIRATORY_TRACT
  Filled 2023-09-09: qty 3

## 2023-09-09 MED ORDER — DEXTROSE 50 % IV SOLN
INTRAVENOUS | Status: AC
  Administered 2023-09-09: 50 mL via INTRAVENOUS
  Filled 2023-09-09: qty 50

## 2023-09-09 NOTE — Progress Notes (Signed)
 eLink Physician-Brief Progress Note Patient Name: [name] DOB: [birth year] MRN: [id number]   Date of Service  09/09/2023  HPI/Events of Note  Notified of potassium 5.5, was 5.5 yesterday. Cr 1.1.   eICU Interventions  Hyperkalemia protocol with insulin +dextrose , lokelma  ordered.  Maintain on continuous cardiac monitoring.         Derek Laughter M DELA CRUZ 09/09/2023, 6:05 AM

## 2023-09-09 NOTE — Progress Notes (Signed)
 NAME:  [name], MRN:  [id number], DOB:  1961/02/06, LOS: 13 ADMISSION DATE:  08/27/2023, CONSULTATION DATE:  7/15 REFERRING MD:  TRH, CHIEF COMPLAINT:  hypotension    History of Present Illness:  63yo male smoker with hx HTN, COPD, PAF on eliquis , NICM, HFpEF presented 7/14 with 1 week progressive SOB, cough, malaise, poor appetite. On EMS arrival with hypoxic with sats 80% on RA. In ER initially BP was soft with SBP 80's, tachy 130's but responded well to 1L fluids. CXR revealed dense RUL PNA. He was admitted by TRH, started on IV rocephin /azithro. On 7/15 he was still boarding in ER and again having hypotension with SBP 80's and PCCM was consulted.  Lactate 1.4 WBC 34  Pertinent  Medical History   has a past medical history of Anxiety, Arthritis, Cardiomyopathy (HCC), CHF (congestive heart failure) (HCC), COPD (chronic obstructive pulmonary disease) (HCC), Dysphagia, Dysrhythmia, GERD (gastroesophageal reflux disease), History of blood transfusion, and Hypertension.   Significant Hospital Events: Including procedures, antibiotic start and stop dates in addition to other pertinent events   CT chest 7/14>> Extensive airspace consolidation in the right upper lobe with lesser changes in the right middle and lower lobes consistent with multifocal infiltrate. Short-term follow-up following appropriate therapy is recommended to assess for resolution. Echo 7/15>>>LVEF 55-60% no WMA, LA dilated 7/17 Worsening resp status this am, feels like he cannot catch breath, anxious as well. Got lasix , HHFO2, dex for restlessness.BLEUS neg for DVT 7/18 breathing better 70% FiO2 on heated high flow adding additional pulmonary hygiene measures 7/24 PCCM re-consulted due to increasing O2 needs and worsening chest x-ray 7/25 bronched. Txf to ICU post case w incr O2. Cortrak. RUQ US    7/26 HHFNC  Interim History / Subjective:   Remains critically ill, on HHFNC 75% / 40 L Off Levophed  Good urine output  but 11 L positive  Objective    Blood pressure 121/69, pulse 66, temperature 97.8 F (36.6 C), temperature source Oral, resp. rate 17, height 5' 9 (1.753 m), weight 88.3 kg, SpO2 97%.    FiO2 (%):  [60 %-75 %] 75 %   Intake/Output Summary (Last 24 hours) at 09/09/2023 1023 Last data filed at 09/09/2023 0641 Gross per 24 hour  Intake 3736.43 ml  Output 1400 ml  Net 2336.43 ml   Filed Weights   08/30/23 0255 09/08/23 0500 09/09/23 0424  Weight: 80.8 kg 91.3 kg 88.3 kg    Examination: General:  acutely ill M HEENT NCAT HFNC and cortrak Pulmonary: Crackles right base, no accessory muscle use Cardiac: S1-S2 regular Abdomen: soft ndnt  Extremities: cap refill <  3 sec  Neuro: Somnolent, awakens easily, following commands, nonfocal  Labs show hyperkalemia , stable hyponatremia, and increased leukocytosis Chest x-ray 7/26 extensive airspace disease on right, small bilateral effusions  Resolved problem list  Septic shock Assessment and Plan   Septic shock  -looks like somewhat variable BP this admission. Started low dose periph pressors 7/25, ?medication related side effects v septic shock  P -Off pressors -abx escalated to linezolid , mero Covering for steno [place]cordia strep constellatus(was intermediate to PCNs and tx w zosyn ), HCAP  , DC Bactrim  -follow bal -negative so far except for rare yeast -fungitell     Acute respiratory failure with hypoxia Transferred to ICU post bronchoscopy for increasing oxygen requirements  -R PNA-- strep constellatus PNA v HCAP, aspiration COPD Tobacco use  Plan  -On HHFNC -wean O2 as tolerated -pulm hygiene, mobility  -he declines nicotine  patch --  Monitor effusions on imaging and consider Thora versus drainage if worse , CXR am   Hx NICM/HFpEF and CAD Hx pAfib  Plan -dc metop  - Lasix  40 x 1 -amio, eliquis     Elevated LFTs  -RUQ US  w dilated GB and dilated CBD and mildly dilated intrahepatic bile duct  -MRCP v ERCP was  recommended P - Repeat LFTs close to normal, hold off GI consult   CKD 3  - trend Cr Hyperkalemia -related to Bactrim  , DC Bactrim  , treated with cocktail, repeat potassium  Family updated at bedside  CRITICAL CARE Performed by: Harden ROCKFORD Shweta Aman  Total critical care time: 33 minutes  Critical care time was exclusive of separately billable procedures and treating other patients. Critical care was necessary to treat or prevent imminent or life-threatening deterioration.  Critical care was time spent personally by me on the following activities: development of treatment plan with patient and/or surrogate as well as nursing, discussions with consultants, evaluation of patient's response to treatment, examination of patient, obtaining history from patient or surrogate, ordering and performing treatments and interventions, ordering and review of laboratory studies, ordering and review of radiographic studies, pulse oximetry and re-evaluation of patient's condition.  Harden Staff MD. DEBE. [place] [facility] Pager : [phone]  If no response to pager , please call [phone] until 7 pm After 7:00 pm call Elink  [phone]     09/09/2023, 10:23 AM

## 2023-09-09 NOTE — Progress Notes (Addendum)
 eLink Physician-Brief Progress Note Patient Name: [name] DOB: 08-22-1960 MRN: [id number]   Date of Service  09/09/2023  HPI/Events of Note  RN reported increased wheezing and increase in Fio2 on HHFNC from 83% to 90% (35 L flow). Has been given a duoneb before I saw him on camera and they feel he looks better. Has had 2+ liter urine during the day and RN emptied out 500 cc now as well. O2 sat 92. Awake. No distress. RR 22  eICU Interventions  Will get a CXR also now      Intervention Category Major Interventions: Respiratory failure - evaluation and management  Lynden Flemmer G Allysha Tryon 09/09/2023, 9:11 PM  10:10 pm - CXR not very different. Asking for phenol spray, order placed.

## 2023-09-10 DIAGNOSIS — J449 Chronic obstructive pulmonary disease, unspecified: Secondary | ICD-10-CM | POA: Diagnosis not present

## 2023-09-10 DIAGNOSIS — J90 Pleural effusion, not elsewhere classified: Secondary | ICD-10-CM

## 2023-09-10 DIAGNOSIS — J9601 Acute respiratory failure with hypoxia: Secondary | ICD-10-CM | POA: Diagnosis not present

## 2023-09-10 DIAGNOSIS — R6521 Severe sepsis with septic shock: Secondary | ICD-10-CM | POA: Diagnosis not present

## 2023-09-10 DIAGNOSIS — R7401 Elevation of levels of liver transaminase levels: Secondary | ICD-10-CM

## 2023-09-10 DIAGNOSIS — A419 Sepsis, unspecified organism: Secondary | ICD-10-CM | POA: Diagnosis not present

## 2023-09-10 LAB — BASIC METABOLIC PANEL WITH GFR
Anion gap: 7 (ref 5–15)
BUN: 36 mg/dL — ABNORMAL HIGH (ref 8–23)
CO2: 31 mmol/L (ref 22–32)
Calcium: 8.5 mg/dL — ABNORMAL LOW (ref 8.9–10.3)
Chloride: 98 mmol/L (ref 98–111)
Creatinine, Ser: 1.16 mg/dL (ref 0.61–1.24)
GFR, Estimated: >60 mL/min (ref >60)
Glucose, Bld: 100 mg/dL — ABNORMAL HIGH (ref 70–99)
Potassium: 5.8 mmol/L — ABNORMAL HIGH (ref 3.5–5.1)
Sodium: 136 mmol/L (ref 135–145)

## 2023-09-10 LAB — CBC WITH DIFFERENTIAL/PLATELET
Abs Immature Granulocytes: 0.14 K/uL — ABNORMAL HIGH (ref 0.00–0.07)
Basophils Absolute: 0 K/uL (ref 0.0–0.1)
Basophils Relative: 0 %
Eosinophils Absolute: 0 K/uL (ref 0.0–0.5)
Eosinophils Relative: 0 %
HCT: 29 % — ABNORMAL LOW (ref 39.0–52.0)
Hemoglobin: 9.2 g/dL — ABNORMAL LOW (ref 13.0–17.0)
Immature Granulocytes: 1 %
Lymphocytes Relative: 6 %
Lymphs Abs: 0.8 K/uL (ref 0.7–4.0)
MCH: 26.7 pg (ref 26.0–34.0)
MCHC: 31.7 g/dL (ref 30.0–36.0)
MCV: 84.3 fL (ref 80.0–100.0)
Monocytes Absolute: 1.4 K/uL — ABNORMAL HIGH (ref 0.1–1.0)
Monocytes Relative: 10 %
Neutro Abs: 11.9 K/uL — ABNORMAL HIGH (ref 1.7–7.7)
Neutrophils Relative %: 83 %
Platelets: 219 K/uL (ref 150–400)
RBC: 3.44 MIL/uL — ABNORMAL LOW (ref 4.22–5.81)
RDW: 19.5 % — ABNORMAL HIGH (ref 11.5–15.5)
WBC: 14.3 K/uL — ABNORMAL HIGH (ref 4.0–10.5)
nRBC: 0 % (ref 0.0–0.2)

## 2023-09-10 LAB — COMPREHENSIVE METABOLIC PANEL WITH GFR
ALT: 62 U/L — ABNORMAL HIGH (ref 0–44)
AST: 47 U/L — ABNORMAL HIGH (ref 15–41)
Albumin: 2.8 g/dL — ABNORMAL LOW (ref 3.5–5.0)
Alkaline Phosphatase: 78 U/L (ref 38–126)
Anion gap: 7 (ref 5–15)
BUN: 37 mg/dL — ABNORMAL HIGH (ref 8–23)
CO2: 29 mmol/L (ref 22–32)
Calcium: 8.3 mg/dL — ABNORMAL LOW (ref 8.9–10.3)
Chloride: 97 mmol/L — ABNORMAL LOW (ref 98–111)
Creatinine, Ser: 1.2 mg/dL (ref 0.61–1.24)
GFR, Estimated: >60 mL/min (ref >60)
Glucose, Bld: 108 mg/dL — ABNORMAL HIGH (ref 70–99)
Potassium: 5.8 mmol/L — ABNORMAL HIGH (ref 3.5–5.1)
Sodium: 133 mmol/L — ABNORMAL LOW (ref 135–145)
Total Bilirubin: 0.2 mg/dL (ref 0.0–1.2)
Total Protein: 5.5 g/dL — ABNORMAL LOW (ref 6.5–8.1)

## 2023-09-10 LAB — MAGNESIUM: Magnesium: 2.3 mg/dL (ref 1.7–2.4)

## 2023-09-10 LAB — GLUCOSE, CAPILLARY
Glucose-Capillary: 101 mg/dL — ABNORMAL HIGH (ref 70–99)
Glucose-Capillary: 109 mg/dL — ABNORMAL HIGH (ref 70–99)
Glucose-Capillary: 123 mg/dL — ABNORMAL HIGH (ref 70–99)
Glucose-Capillary: 89 mg/dL (ref 70–99)
Glucose-Capillary: 98 mg/dL (ref 70–99)
Glucose-Capillary: 99 mg/dL (ref 70–99)
Glucose-Capillary: 99 mg/dL (ref 70–99)

## 2023-09-10 LAB — PHOSPHORUS: Phosphorus: 3.1 mg/dL (ref 2.5–4.6)

## 2023-09-10 MED ORDER — SODIUM ZIRCONIUM CYCLOSILICATE 10 G PO PACK
10.0000 g | PACK | Freq: Three times a day (TID) | ORAL | Status: DC
  Administered 2023-09-10 – 2023-09-13 (×9): 10 g
  Filled 2023-09-10 (×9): qty 1

## 2023-09-10 MED ORDER — DEXTROSE 50 % IV SOLN
1.0000 | Freq: Once | INTRAVENOUS | Status: AC
  Administered 2023-09-10: 50 mL via INTRAVENOUS
  Filled 2023-09-10: qty 50

## 2023-09-10 MED ORDER — SODIUM ZIRCONIUM CYCLOSILICATE 10 G PO PACK
10.0000 g | PACK | Freq: Once | ORAL | Status: AC
  Administered 2023-09-10: 10 g
  Filled 2023-09-10: qty 1

## 2023-09-10 MED ORDER — INSULIN ASPART 100 UNIT/ML IV SOLN
5.0000 [IU] | Freq: Once | INTRAVENOUS | Status: AC
  Administered 2023-09-10: 5 [IU] via INTRAVENOUS

## 2023-09-10 MED ORDER — FUROSEMIDE 10 MG/ML IJ SOLN
40.0000 mg | Freq: Once | INTRAMUSCULAR | Status: AC
  Administered 2023-09-10: 40 mg via INTRAVENOUS
  Filled 2023-09-10: qty 4

## 2023-09-10 MED ORDER — IPRATROPIUM-ALBUTEROL 0.5-2.5 (3) MG/3ML IN SOLN
3.0000 mL | RESPIRATORY_TRACT | Status: DC
  Administered 2023-09-10 – 2023-09-11 (×6): 3 mL via RESPIRATORY_TRACT
  Filled 2023-09-10 (×6): qty 3

## 2023-09-10 NOTE — TOC Progression Note (Signed)
 Transition of Care [facility]) - Progression Note    Patient Details  Name: [name] MRN: [id number] Date of Birth: 13-Sep-1960  Transition of Care [facility]) CM/SW Contact  Tom-Johnson, Nainika Newlun Daphne, RN Phone Number: 09/10/2023, 3:16 PM  Clinical Narrative:     Patient continues on IV abx, on HHFNC 35L 94%. PT/OT's recommendation changed to SNF at this time.   Patient not Medically ready for discharge.  CM will continue to follow as patient progresses with care towards discharge.         Expected Discharge Plan: Home w Home Health Services Barriers to Discharge: Continued Medical Work up               Expected Discharge Plan and Services In-house Referral: NA Discharge Planning Services: CM Consult Post Acute Care Choice: Durable Medical Equipment, Home Health Living arrangements for the past 2 months: Mobile Home                   DME Agency: NA       HH Arranged: PT, OT HH Agency: [facility] Date [facility] Agency Contacted: 09/06/23 Time HH Agency Contacted: 1414 Representative spoke with at [facility] Agency: Amy   Social Drivers of Health (SDOH) Interventions SDOH Screenings   Food Insecurity: No Food Insecurity (08/28/2023)  Housing: Low Risk  (08/28/2023)  Transportation Needs: No Transportation Needs (08/28/2023)  Utilities: Not At Risk (08/28/2023)  Tobacco Use: High Risk (09/07/2023)    Readmission Risk Interventions    08/30/2023    1:18 PM  Readmission Risk Prevention Plan  Transportation Screening Complete  HRI or Home Care Consult Complete  Social Work Consult for Recovery Care Planning/Counseling Complete  Palliative Care Screening Not Applicable  Medication Review Oceanographer) Referral to Pharmacy

## 2023-09-10 NOTE — Progress Notes (Signed)
 NAME:  [name], MRN:  [id number], DOB:  [birth year], LOS: 14 ADMISSION DATE:  08/27/2023, CONSULTATION DATE:  7/15 REFERRING MD:  TRH, CHIEF COMPLAINT:  hypotension    History of Present Illness:  62yo male smoker with hx HTN, COPD, PAF on eliquis , NICM, HFpEF presented 7/14 with 1 week progressive SOB, cough, malaise, poor appetite. On EMS arrival with hypoxic with sats 80% on RA. In ER initially BP was soft with SBP 80's, tachy 130's but responded well to 1L fluids. CXR revealed dense RUL PNA. He was admitted by TRH, started on IV rocephin /azithro. On 7/15 he was still boarding in ER and again having hypotension with SBP 80's and PCCM was consulted.  Lactate 1.4 WBC 34  Pertinent  Medical History   has a past medical history of Anxiety, Arthritis, Cardiomyopathy (HCC), CHF (congestive heart failure) (HCC), COPD (chronic obstructive pulmonary disease) (HCC), Dysphagia, Dysrhythmia, GERD (gastroesophageal reflux disease), History of blood transfusion, and Hypertension.   Significant Hospital Events: Including procedures, antibiotic start and stop dates in addition to other pertinent events   CT chest 7/14>> Extensive airspace consolidation in the right upper lobe with lesser changes in the right middle and lower lobes consistent with multifocal infiltrate. Short-term follow-up following appropriate therapy is recommended to assess for resolution. Echo 7/15>>>LVEF 55-60% no WMA, LA dilated 7/17 Worsening resp status this am, feels like he cannot catch breath, anxious as well. Got lasix , HHFO2, dex for restlessness.BLEUS neg for DVT 7/18 breathing better 70% FiO2 on heated high flow adding additional pulmonary hygiene measures 7/24 PCCM re-consulted due to increasing O2 needs and worsening chest x-ray 7/25 bronched. Txf to ICU post case w incr O2. Cortrak. RUQ US    7/26 HHFNC  Interim History / Subjective:   Remains on high flow nasal cannula Off Levophed   Objective    Blood  pressure 114/74, pulse (!) 111, temperature 98.1 F (36.7 C), temperature source Oral, resp. rate (!) 23, height 5' 9 (1.753 m), weight 88.1 kg, SpO2 100%.    FiO2 (%):  [30 %-90 %] 90 %   Intake/Output Summary (Last 24 hours) at 09/10/2023 1027 Last data filed at 09/10/2023 1003 Gross per 24 hour  Intake 3043.86 ml  Output 3125 ml  Net -81.14 ml   Filed Weights   09/08/23 0500 09/09/23 0424 09/10/23 0401  Weight: 91.3 kg 88.3 kg 88.1 kg    Examination: General: Acutely ill-appearing HEENT: Moist oral mucosa Pulmonary: Bilateral crackles Cardiac: S1-S2 appreciated Abdomen: Soft, bowel sounds appreciated Extremities: No clubbing, no edema awake alert interactive Neuro: Awake alert interactive, nonfocal  I reviewed last 24 h vitals and pain scores, last 48 h intake and output, last 24 h labs and trends, and last 24 h imaging results. Leukocytosis is improving Chest x-ray with extensive infiltrate on the right  Resolved problem list  Septic shock Assessment and Plan   Septic shock -Off pressors - Remains on antibiotics including linezolid , meropenem  - BAL negative so far - Fungitell results pending  Acute respiratory failure with hypoxia COPD Tobacco use - Continue high flow nasal cannula - Wean oxygen as tolerated - Continue pulmonary hygiene  Small pleural effusions - Diuresing well Will continue to monitor  HFpEF Coronary artery disease History of paroxysmal atrial fibrillation - On amiodarone , Eliquis   Elevated LFTs - Elevated LFTs trending well  Chronic kidney disease stage III Hypokalemia felt related to Bactrim  - Bactrim  held  Risk of decompensation remains high   The patient is critically ill with multiple organ  systems failure and requires high complexity decision making for assessment and support, frequent evaluation and titration of therapies, application of advanced monitoring technologies and extensive interpretation of multiple databases.  Critical Care Time devoted to patient care services described in this note independent of APP/resident time (if applicable)  is 30 minutes.   Jennet Epley MD [facility] Personal pager: See Amion If unanswered, please page CCM On-call: #[phone]

## 2023-09-10 NOTE — Progress Notes (Signed)
 SLP Cancellation Note  Patient Details Name: [name] MRN: [id number] DOB: 11/20/1960   Cancelled treatment:       Reason Eval/Treat Not Completed: Medical issues which prohibited therapy. Pt remains NPO with Cortrak on [facility] s/p bronchoscopy 7/25. Pt cannot yet travel to radiology to complete MBS as previously planned. Discussed with CCM, who states it is okay to proceed with a FEES for evaluation of pharyngeal dysphagia (will be unable to assess suspected esophageal component). SLP will f/u next date for FEES as pt's respiratory status allows.    Damien Blumenthal, M.A., CCC-SLP Speech Language Pathology, Acute Rehabilitation Services  Secure Chat preferred [phone]  09/10/2023, 12:49 PM

## 2023-09-10 NOTE — Anesthesia Postprocedure Evaluation (Signed)
 Anesthesia Post Note  Patient: [name]  Procedure(s) Performed: BRONCHOSCOPY, FLEXIBLE (Bilateral)     Patient location during evaluation: PACU Anesthesia Type: General Level of consciousness: awake and alert Pain management: pain level controlled Vital Signs Assessment: post-procedure vital signs reviewed and stable Respiratory status: spontaneous breathing, nonlabored ventilation, respiratory function stable and patient connected to nasal cannula oxygen Cardiovascular status: blood pressure returned to baseline and stable Postop Assessment: no apparent nausea or vomiting Anesthetic complications: no   No notable events documented.  Last Vitals:  Vitals:   09/10/23 1130 09/10/23 1200  BP: 124/83 116/72  Pulse: (!) 107 (!) 104  Resp: (!) 27 (!) 25  Temp:  (!) 36.4 C  SpO2: 100% 100%    Last Pain:  Vitals:   09/10/23 1200  TempSrc: Oral  PainSc:                  Amaryllis Malmquist S

## 2023-09-10 NOTE — Progress Notes (Signed)
 Physical Therapy Treatment Patient Details Name: [name] MRN: [id number] DOB: November 10, 1960 Today's Date: 09/10/2023   History of Present Illness Pt is a 63 yr old male who presented 08/27/23 due to SOB, decrease in appetite. Pt the went into septic shock and was transferred into ICU and transferred out on 09/02/23.  Increased O2 requirement on 7/25 and noted chest CT with cavitation of R UL suggestive of necrosis so pt underwent bronchoscopy and transferred back to ICU with HHFNC to 35L 94% FiO2. PMH: HTN, COPD, PAF on eliquis , NICM, HFpEF    PT Comments  Patient in bed and declined OOB mobility due to up to [facility] with nursing and almost fell RN report had to use Stedy for back to bed.  Noted SpO2 drop with mobility to pull on rail up to long sitting in bed despite on HHFNC 35L 94%.  He was able to complete bed level LE therex.  Feel he may need post-acute inpatient rehab (<3 hours/day) depending on progress.  PT will continue to follow.     If plan is discharge home, recommend the following: Help with stairs or ramp for entrance;Assistance with cooking/housework;Assist for transportation;A lot of help with walking and/or transfers;A lot of help with bathing/dressing/bathroom   Can travel by private vehicle     No  Equipment Recommendations  Other (comment) (TBA)    Recommendations for Other Services       Precautions / Restrictions Precautions Precautions: Fall Precaution/Restrictions Comments: watch o2 and bp     Mobility  Bed Mobility               General bed mobility comments: sitting up in bed, declined mobility as just up to [facility] with nursing and almost fell (RN reports had to use Stedy for back to bed); able to pull up to long sit using rails x 5 reps with extended rest in between due to SpO2 drop to 88%    Transfers                   General transfer comment: declined OOB today    Ambulation/Gait                   Stairs              Wheelchair Mobility     Tilt Bed    Modified Rankin (Stroke Patients Only)       Balance                                            Communication Communication Communication: No apparent difficulties  Cognition Arousal: Alert Behavior During Therapy: WFL for tasks assessed/performed   PT - Cognitive impairments: No apparent impairments                         Following commands: Intact      Cueing Cueing Techniques: Verbal cues  Exercises General Exercises - Lower Extremity Ankle Circles/Pumps: AROM, Both, 10 reps, Supine Short Arc Quad: AROM, Both, 10 reps, Supine (5 sec hold) Hip ABduction/ADduction: AROM, Both, 10 reps, Supine, AAROM Other Exercises Other Exercises: hip adductor squeezes with 5 sec hold x 10 reps in supine    General Comments        Pertinent Vitals/Pain Pain Assessment Pain Score: 9  Pain Location: back and abdomen Pain Descriptors /  Indicators: Discomfort, Sore Pain Intervention(s): Monitored during session, Repositioned, Patient requesting pain meds-RN notified    Home Living                          Prior Function            PT Goals (current goals can now be found in the care plan section) Progress towards PT goals: Not progressing toward goals - comment    Frequency    Min 2X/week      PT Plan      Co-evaluation              AM-PAC PT 6 Clicks Mobility   Outcome Measure  Help needed turning from your back to your side while in a flat bed without using bedrails?: A Lot Help needed moving from lying on your back to sitting on the side of a flat bed without using bedrails?: A Lot Help needed moving to and from a bed to a chair (including a wheelchair)?: Total Help needed standing up from a chair using your arms (e.g., wheelchair or bedside chair)?: Total Help needed to walk in hospital room?: Total Help needed climbing 3-5 steps with a railing? : Total 6 Click Score:  8    End of Session   Activity Tolerance: Patient limited by pain;Treatment limited secondary to medical complications (Comment) (SpO2 drop while on 94% FiO2 HHFNC) Patient left: in bed   PT Visit Diagnosis: Muscle weakness (generalized) (M62.81);Other abnormalities of gait and mobility (R26.89)     Time: 8799-8782 PT Time Calculation (min) (ACUTE ONLY): 17 min  Charges:    $Therapeutic Activity: 8-22 mins PT General Charges $$ ACUTE PT VISIT: 1 Visit                     Micheline Portal, PT Acute Rehabilitation Services Office:[phone] 09/10/2023    Montie Portal 09/10/2023, 1:27 PM

## 2023-09-10 NOTE — Progress Notes (Signed)
 Nutrition Follow-up  DOCUMENTATION CODES:  Non-severe (moderate) malnutrition in context of chronic illness  INTERVENTION:  Continue TF via Cortrak: Osmolite 1.5 at 55ml/hr (1320ml per day) 60ml ProSource TF20 once daily Provides 2060 kcal, 103g protein, free water  daily  Continue MVI with minerals daily  Monitor ability to resume diet post MBS; adjust nutrition interventions as appropriate.   NUTRITION DIAGNOSIS:  Moderate Malnutrition related to chronic illness (CHF, COPD) as evidenced by mild fat depletion, mild muscle depletion, moderate muscle depletion, edema. - diagnosis updated 7/29   GOAL:  Patient will meet greater than or equal to 90% of their needs - goal met via TF  MONITOR:  TF tolerance, Diet advancement, I & O's, Labs  REASON FOR ASSESSMENT:  Consult Enteral/tube feeding initiation and management  ASSESSMENT:  Pt with hx of former alcohol abuse and tobacco abuse, CHF, COPD, HTN, HLD, and atrial fibrillation presented to ED with worsening SOB and poor appetite. Found to be septic on admission related to PNA.  7/14 - presented to ED 7/15 - admitted to [facility], SLP evaluation (regular, thin) 7/19 - transferred to 4E 7/24 - SLE BSE, DYS3/thin 7/25 - bronchoscopy, transferred to ICU post-procedure; NPO; Cortrak placed  Spoke with pt at bedside though notably short of breath. He state that his appetite comes and goes but reports eating alright. He mentions that within the last few weeks, he has lost about 22-28 lbs though suspect that some of this weight loss is r/t to fluid loss.   He is noted to have a history of esophageal dilations. SLP consulted to assess for swallow safety. SLP recommending MBS to assess for esophageal component however unable to perform yesterday d/t respiratory status. Pt declined FEES this morning. Spoke with SLP who reports pt planned for MBS this afternoon pending ability to wean oxygen.   Pt mentions that over the last few weeks,  he was primarily getting up and going from the bed to the chair.   TF infusing at goal rate. Endorses adequate tolerance. No n/v/d.   Admit weight: 77.8 kg Current weight: 88 kg +edema: very deep pitting generalized, LLE; deep pitting RLE  Drains/lines: UOP: x24 hours COrtrak   Medications: SSI 0-15 units q4h, MVI, lokelma  TID, thiamine   Labs:  Potassium 5.4 BUN 30 CBG's 98-109 x24 hours  NUTRITION - FOCUSED PHYSICAL EXAM: Flowsheet Row Most Recent Value  Orbital Region Unable to assess  [HHFNC]  Upper Arm Region Mild depletion  Thoracic and Lumbar Region Unable to assess  [edema]  Buccal Region Moderate depletion  Temple Region Severe depletion  Clavicle Bone Region Moderate depletion  Clavicle and Acromion Bone Region Moderate depletion  Scapular Bone Region Mild depletion  Dorsal Hand Unable to assess  [L hand moderate edema]  Patellar Region Mild depletion  Anterior Thigh Region Mild depletion  Posterior Calf Region Unable to assess  [severe pitting edema]  Edema (RD Assessment) Severe  Hair Reviewed  Eyes Reviewed  Mouth Reviewed  Skin Reviewed  Nails Reviewed    Diet Order:   Diet Order             Diet NPO time specified  Diet effective now                   EDUCATION NEEDS:   Not appropriate for education at this time  Skin:  Skin Assessment: Reviewed RN Assessment  Last BM:  7/29 type 6 large  Height:   Ht Readings from Last 1 Encounters:  08/27/23 5' 9 (1.753 m)    Weight:   Wt Readings from Last 1 Encounters:  09/11/23 88 kg    Ideal Body Weight:  72.7 kg  BMI:  Body mass index is 28.65 kg/m.  Estimated Nutritional Needs:   Kcal:  2000-2200 kcal/d  Protein:  100-120 g/d  Fluid:  2-2.2L/d  Allie Kennice Finnie, RDN, LDN Clinical Nutrition See AMiON for contact information.

## 2023-09-10 NOTE — Progress Notes (Signed)
 eLink Physician-Brief Progress Note Patient Name: [name] DOB: May 18, 1960 MRN: [id number]   Date of Service  09/10/2023  HPI/Events of Note  Hyperkalemia (recurrent issue)  eICU Interventions  Insulin , D50, glucose checks, Lokelma  and repeat K in 4 hours ordered     Intervention Category Major Interventions: Electrolyte abnormality - evaluation and management  Saliyah Gillin G Wanita Derenzo 09/10/2023, 4:52 AM

## 2023-09-11 ENCOUNTER — Inpatient Hospital Stay (HOSPITAL_COMMUNITY)

## 2023-09-11 DIAGNOSIS — A419 Sepsis, unspecified organism: Secondary | ICD-10-CM | POA: Diagnosis not present

## 2023-09-11 DIAGNOSIS — J9601 Acute respiratory failure with hypoxia: Secondary | ICD-10-CM | POA: Diagnosis not present

## 2023-09-11 DIAGNOSIS — I5030 Unspecified diastolic (congestive) heart failure: Secondary | ICD-10-CM

## 2023-09-11 DIAGNOSIS — R6521 Severe sepsis with septic shock: Secondary | ICD-10-CM | POA: Diagnosis not present

## 2023-09-11 DIAGNOSIS — J449 Chronic obstructive pulmonary disease, unspecified: Secondary | ICD-10-CM | POA: Diagnosis not present

## 2023-09-11 LAB — CYTOLOGY - NON PAP

## 2023-09-11 LAB — BASIC METABOLIC PANEL WITH GFR
Anion gap: 6 (ref 5–15)
BUN: 30 mg/dL — ABNORMAL HIGH (ref 8–23)
CO2: 33 mmol/L — ABNORMAL HIGH (ref 22–32)
Calcium: 8.4 mg/dL — ABNORMAL LOW (ref 8.9–10.3)
Chloride: 98 mmol/L (ref 98–111)
Creatinine, Ser: 0.97 mg/dL (ref 0.61–1.24)
GFR, Estimated: >60 mL/min (ref >60)
Glucose, Bld: 104 mg/dL — ABNORMAL HIGH (ref 70–99)
Potassium: 5.4 mmol/L — ABNORMAL HIGH (ref 3.5–5.1)
Sodium: 137 mmol/L (ref 135–145)

## 2023-09-11 LAB — GLUCOSE, CAPILLARY
Glucose-Capillary: 104 mg/dL — ABNORMAL HIGH (ref 70–99)
Glucose-Capillary: 100 mg/dL — ABNORMAL HIGH (ref 70–99)
Glucose-Capillary: 94 mg/dL (ref 70–99)
Glucose-Capillary: 83 mg/dL (ref 70–99)

## 2023-09-11 MED ORDER — IPRATROPIUM-ALBUTEROL 0.5-2.5 (3) MG/3ML IN SOLN
3.0000 mL | RESPIRATORY_TRACT | Status: DC | PRN
  Filled 2023-09-11: qty 3

## 2023-09-11 MED ORDER — BETHANECHOL CHLORIDE 10 MG PO TABS
10.0000 mg | ORAL_TABLET | Freq: Three times a day (TID) | ORAL | Status: DC
  Administered 2023-09-11 – 2023-09-26 (×52): 10 mg via ORAL
  Filled 2023-09-11 (×48): qty 1

## 2023-09-11 MED ORDER — INSULIN ASPART 100 UNIT/ML IJ SOLN
0.0000 [IU] | INTRAMUSCULAR | Status: DC
  Administered 2023-09-13: 2 [IU] via SUBCUTANEOUS
  Administered 2023-09-13 – 2023-09-14 (×2): 1 [IU] via SUBCUTANEOUS
  Administered 2023-09-18: 2 [IU] via SUBCUTANEOUS
  Administered 2023-09-18 – 2023-09-19 (×3): 1 [IU] via SUBCUTANEOUS
  Administered 2023-09-20: 3 [IU] via SUBCUTANEOUS
  Administered 2023-09-20: 2 [IU] via SUBCUTANEOUS
  Administered 2023-09-24 (×2): 1 [IU] via SUBCUTANEOUS

## 2023-09-11 NOTE — Progress Notes (Signed)
 Occupational Therapy Treatment Patient Details Name: [name] MRN: [id number] DOB: 07/19/1960 Today's Date: 09/11/2023   History of present illness Pt is a 63 yr old male who presented 08/27/23 due to SOB, decrease in appetite. Pt the went into septic shock and was transferred into ICU and transferred out on 09/02/23.  Increased O2 requirement on 7/25 and noted chest CT with cavitation of R UL suggestive of necrosis so pt underwent bronchoscopy and transferred back to ICU with HHFNC to 35L 94% FiO2. PMH: HTN, COPD, PAF on eliquis , NICM, HFpEF   OT comments  Pt now in ICU, but on 4 L supplemental O2 today as compared to [facility] yesterday. Pt motivated to participate in OT session, stating I need to get out of bed for my lungs, right?. Pt with fair insight into needs and mobility progression, however, requires dense cues to PLB today to maintain SpO2 during session as he reports being in pain from the heat of the [facility] yesterday. OT facilitating PLB with multimodal cues as needed. Pt able to transfer bed to chair with RW with min A, needing up to 1 min to recover SpO2 >90.       If plan is discharge home, recommend the following:  A lot of help with walking and/or transfers;A lot of help with bathing/dressing/bathroom;Assistance with cooking/housework;Assist for transportation;Help with stairs or ramp for entrance   Equipment Recommendations  Other (comment) (defer)    Recommendations for Other Services      Precautions / Restrictions Precautions Precautions: Fall Precaution/Restrictions Comments: watch o2 and bp Restrictions Weight Bearing Restrictions Per Provider Order: No       Mobility Bed Mobility Overal bed mobility: Needs Assistance Bed Mobility: Supine to Sit     Supine to sit: Contact guard          Transfers Overall transfer level: Needs assistance Equipment used: Rolling walker (2 wheels) Transfers: Sit to/from Stand, Bed to chair/wheelchair/BSC Sit to  Stand: Min assist     Step pivot transfers: Min assist     General transfer comment: min A for rise and steadying and for steadying/safety during pivotal steps to chair     Balance Overall balance assessment: Needs assistance Sitting-balance support: Feet supported, Bilateral upper extremity supported Sitting balance-[name]: Fair Sitting balance - Comments: fatiges easily   Standing balance support: Bilateral upper extremity supported, During functional activity, Reliant on assistive device for balance Standing balance-[name]: Poor Standing balance comment: reliant on RW                           ADL either performed or assessed with clinical judgement   ADL Overall ADL's : Needs assistance/impaired Eating/Feeding: NPO   Grooming: Wash/dry face;Set up;Sitting                   Toilet Transfer: Minimal assistance;Stand-pivot;Rolling walker (2 wheels)                  Extremity/Trunk Assessment Upper Extremity Assessment Upper Extremity Assessment: Generalized weakness   Lower Extremity Assessment Lower Extremity Assessment: Defer to PT evaluation        Vision       Perception     Praxis     Communication Communication Communication: No apparent difficulties   Cognition Arousal: Alert Behavior During Therapy: WFL for tasks assessed/performed Cognition: Cognition impaired     Awareness: Intellectual awareness intact, Online awareness impaired   Attention impairment (select first level  of impairment): Selective attention Executive functioning impairment (select all impairments): Reasoning, Problem solving OT - Cognition Comments: slowed processing today and asking same questions 2+ times                 Following commands: Intact        Cueing   Cueing Techniques: Verbal cues  Exercises      Shoulder Instructions       General Comments HR 100-122 with pt in afib. SpO2 reading poor; attempted hand held  finger pulse ox, but poor reading as well as pt with max thickness fingernails. When SpO2 reading, pt needing up to 5L supplemental O2 to maintain >90 during transition to EOB and transfer ; but on 4L at rest and SpO2 >91. dense cues throughout for PLB    Pertinent Vitals/ Pain       Pain Assessment Pain Assessment: No/denies pain  Home Living Family/patient expects to be discharged to:: Private residence Living Arrangements: Children Available Help at Discharge: Family Type of Home: House Home Access: Stairs to enter Secretary/administrator of Steps: 4 Entrance Stairs-Rails: Right;Left;Can reach both Home Layout: One level     Bathroom Shower/Tub: IT trainer: Standard     Home Equipment: Medical laboratory scientific officer - single point;Rollator (4 wheels)          Prior Functioning/Environment              Frequency  Min 2X/week        Progress Toward Goals  OT Goals(current goals can now be found in the care plan section)  Progress towards OT goals: Progressing toward goals (slowly as pt with transition back to ICU)  Acute Rehab OT Goals Patient Stated Goal: get better OT Goal Formulation: With patient Time For Goal Achievement: 09/18/23 Potential to Achieve Goals: Good ADL Goals Pt Will Perform Upper Body Bathing: Independently;sitting Pt Will Perform Lower Body Bathing: with modified independence;with adaptive equipment Pt Will Perform Upper Body Dressing: with modified independence;sitting Pt Will Perform Lower Body Dressing: with modified independence;sit to/from stand Pt Will Transfer to Toilet: with modified independence;ambulating  Plan      Co-evaluation                 AM-PAC OT 6 Clicks Daily Activity     Outcome Measure   Help from another person eating meals?: Total (NPO) Help from another person taking care of personal grooming?: A Little Help from another person toileting, which includes using toliet, bedpan, or urinal?: A  Lot Help from another person bathing (including washing, rinsing, drying)?: A Lot Help from another person to put on and taking off regular upper body clothing?: A Little Help from another person to put on and taking off regular lower body clothing?: A Lot 6 Click Score: 13    End of Session Equipment Utilized During Treatment: Gait belt;Rolling walker (2 wheels)  OT Visit Diagnosis: Unsteadiness on feet (R26.81);Other abnormalities of gait and mobility (R26.89);Repeated falls (R29.6);Muscle weakness (generalized) (M62.81);History of falling (Z91.81);Pain   Activity Tolerance Patient tolerated treatment well   Patient Left in chair;with call bell/phone within reach;with chair alarm set   Nurse Communication Mobility status        Time: 8580-8558 OT Time Calculation (min): 22 min  Charges: OT General Charges $OT Visit: 1 Visit OT Treatments $Self Care/Home Management : 8-22 mins  Elma JONETTA Lebron FREDERICK, OTR/L [facility] Acute Rehabilitation Office: [phone]   Elma JONETTA Lebron 09/11/2023, 3:14 PM

## 2023-09-11 NOTE — Progress Notes (Signed)
 SLP Cancellation Note  Patient Details Name: [name] MRN: [id number] DOB: [birth year]   Cancelled treatment:       Reason Eval/Treat Not Completed: Fatigue/lethargy limiting ability to participate. Patient declining to have FEES swallow test today for fear of pain. MD arrived into room and he and SLP discussed POC. Plan for weaning patient from HHFNC and MBS if tolerating this.    Norleen IVAR Blase, MA, CCC-SLP Speech Therapy

## 2023-09-11 NOTE — Progress Notes (Addendum)
 NAME:  [name], MRN:  [id number], DOB:  23-Nov-1960, LOS: 15 ADMISSION DATE:  08/27/2023, CONSULTATION DATE:  7/15 REFERRING MD:  [facility], CHIEF COMPLAINT:  hypotension    History of Present Illness:  63yo male smoker with hx HTN, COPD, PAF on eliquis , NICM, HFpEF presented 7/14 with 1 week progressive SOB, cough, malaise, poor appetite. On EMS arrival with hypoxic with sats 80% on RA. In ER initially BP was soft with SBP 80's, tachy 130's but responded well to 1L fluids. CXR revealed dense RUL PNA. He was admitted by [facility], started on IV rocephin /azithro. On 7/15 he was still boarding in ER and again having hypotension with SBP 80's and PCCM was consulted.  Lactate 1.4 WBC 34  Pertinent  Medical History   has a past medical history of Anxiety, Arthritis, Cardiomyopathy (HCC), CHF (congestive heart failure) (HCC), COPD (chronic obstructive pulmonary disease) (HCC), Dysphagia, Dysrhythmia, GERD (gastroesophageal reflux disease), History of blood transfusion, and Hypertension.  Significant Hospital Events: Including procedures, antibiotic start and stop dates in addition to other pertinent events   CT chest 7/14>> Extensive airspace consolidation in the right upper lobe with lesser changes in the right middle and lower lobes consistent with multifocal infiltrate. Short-term follow-up following appropriate therapy is recommended to assess for resolution. Echo 7/15>>>LVEF 55-60% no WMA, LA dilated 7/17 Worsening resp status this am, feels like he cannot catch breath, anxious as well. Got lasix , HHFO2, dex for restlessness.BLEUS neg for DVT 7/18 breathing better 70% FiO2 on heated high flow adding additional pulmonary hygiene measures 7/24 PCCM re-consulted due to increasing O2 needs and worsening chest x-ray 7/25 bronched. Txf to ICU post case w incr O2. Cortrak. RUQ US    7/26 [facility]  Interim History / Subjective:   On high flow nasal cannula Overall feeling better  Objective    Blood  pressure 104/70, pulse (!) 108, temperature 98.3 F (36.8 C), temperature source Oral, resp. rate 17, height 5' 9 (1.753 m), weight 88 kg, SpO2 93%.    FiO2 (%):  [50 %-90 %] 50 %   Intake/Output Summary (Last 24 hours) at 09/11/2023 0914 Last data filed at 09/11/2023 0800 Gross per 24 hour  Intake 2775.3 ml  Output 5850 ml  Net -3074.7 ml   Filed Weights   09/09/23 0424 09/10/23 0401 09/11/23 0304  Weight: 88.3 kg 88.1 kg 88 kg    Examination: General: Appears more comfortable today, chronically ill-appearing HEENT: Moist oral mucosa Pulmonary: Bilateral rales Cardiac: S1-S2 appreciated Abdomen: Soft, bowel sounds appreciated Extremities: No clubbing, no edema Neuro: Awake alert interactive, nonfocal  I reviewed last 24 h vitals and pain scores, last 48 h intake and output, last 24 h labs and trends, and last 24 h imaging results.  Chest x-ray reviewed   Resolved problem list  Septic shock Assessment and Plan   Septic shock - Off pressors - On linezolid , meropenem  -BAL negative so far - Fungitell results pending  Acute respiratory failure with hypoxia COPD Tobacco abuse - Weaned down from high flow nasal cannula - Continue pulmonary hygiene  -Small pleural effusions - Diuresing well - Will continue to monitor  Heart failure with preserved ejection fraction Coronary artery disease History of paroxysmal atrial fibrillation - On amiodarone , Eliquis   Chronic kidney disease stage III - Bactrim  was held for hyperkalemia  Urecholine  added for retention  The patient is critically ill with multiple organ systems failure and requires high complexity decision making for assessment and support, frequent evaluation and titration of therapies, application  of advanced monitoring technologies and extensive interpretation of multiple databases. Critical Care Time devoted to patient care services described in this note independent of APP/resident time (if applicable)  is 32  minutes.   Jennet Epley MD [facility] Personal pager: See Amion If unanswered, please page CCM On-call: #[phone]

## 2023-09-11 NOTE — Progress Notes (Signed)
 eLink Physician-Brief Progress Note Patient Name: [name] DOB: 11/15/1960 MRN: [id number]   Date of Service  09/11/2023  HPI/Events of Note  has been voiding all night w/external cath in place, >900 ml.  Started complaining of abdominal pain.  Bladder scan >1L.    eICU Interventions  Retention protocol  In and out cath as needed     Intervention Category Minor Interventions: Routine modifications to care plan (e.g. PRN medications for pain, fever)  Jayley Hustead 09/11/2023, 6:14 AM

## 2023-09-11 NOTE — Procedures (Signed)
 Modified Barium Swallow Study  Patient Details  Name: [name] MRN: [id number] Date of Birth: 07/11/1960  Today's Date: 09/11/2023  Modified Barium Swallow completed.  Full report located under Chart Review in the Imaging Section.  History of Present Illness [name] is a 63 yo male presenting to ED 7/14 with SOB, cough, malaise, and poor appetite x1 week. CXR showed dense RLL PNA. Complicated by septic shock with pressor needs and transfer to ICU 7/15. Transferred back to [facility] 7/20 but with worsening hypoxia on [facility] 7/24. CXR 7/24 shows increased R lung opacity, concerning for worsening R PNA. CCM re-consulted and CT Chest pending. Thought to have a primary esophageal dysphagia at the time of previous SLP evaluation 7/15 and had been scheduled for an OP esophageal dilation 7/28 PTA. PMH includes former EtOH abuse, tobacco abuse, paroxysmal A-fib, chronic HFpEF, nonischemic cardiomyopathy, HTN, HLD, COPD, esophageal dysphagia with history of stricture and dysmotility s/p multiple dilations   Clinical Impression Patient presents with an oropharyngeal swallow that is [facility] as per this MBS. Swallow initiated at level of vallecular sinus for liquids and solids. Mild amount of vallecular residuals remained s/p initial swallows with solids and liquids but cleared with subsequent swallows. Two instances of penetration, one flash (PAS 2) and one where penetrate did not immediately clear from laryngeal vestibule (PAS 3) observed with thin liquids. No instances of aspiration occured with any of the tested barium consistencies. PES opening appeared [facility]. During esophageal sweep, appearance of barium stasis observed throughout majority of esophagus with retrograde flow below PES. SLP informed MD of findings and will s/o at this time as patient's dysphagia is primarily esophageal in nature.   Factors that may increase risk of adverse event in presence of aspiration Noe & Lianne 2021): Frail or  deconditioned;Presence of tubes (ETT, trach, NG, etc.);Poor general health and/or compromised immunity  Swallow Evaluation Recommendations Recommendations: PO diet PO Diet Recommendation:  (thin liquids, and any type of solids; recs per GI) Liquid Administration via: Cup;Straw Postural changes: Position pt fully upright for meals;Stay upright 30-60 min after meals Oral care recommendations: Oral care BID (2x/day)    Norleen IVAR Blase, MA, CCC-SLP Speech Therapy

## 2023-09-11 NOTE — Progress Notes (Addendum)
 Abnormal barium study-most of the barium that he swallowed was contained in his esophagus  Contacted GI, on review of the records-patient has had issues with dysphagia previously, achalasia  Not a new problem  I will place a diet order  If any new issues with being able to tolerate orally, then GI can be reconsulted Can be followed as outpatient by GI  Appreciate GIs help  Will order liquid diet, can advance as tolerated

## 2023-09-12 DIAGNOSIS — J9601 Acute respiratory failure with hypoxia: Secondary | ICD-10-CM

## 2023-09-12 DIAGNOSIS — I5033 Acute on chronic diastolic (congestive) heart failure: Secondary | ICD-10-CM | POA: Insufficient documentation

## 2023-09-12 DIAGNOSIS — R1319 Other dysphagia: Secondary | ICD-10-CM

## 2023-09-12 DIAGNOSIS — R1310 Dysphagia, unspecified: Secondary | ICD-10-CM

## 2023-09-12 DIAGNOSIS — E440 Moderate protein-calorie malnutrition: Secondary | ICD-10-CM | POA: Insufficient documentation

## 2023-09-12 DIAGNOSIS — K220 Achalasia of cardia: Secondary | ICD-10-CM | POA: Diagnosis not present

## 2023-09-12 DIAGNOSIS — J449 Chronic obstructive pulmonary disease, unspecified: Secondary | ICD-10-CM | POA: Diagnosis not present

## 2023-09-12 DIAGNOSIS — R339 Retention of urine, unspecified: Secondary | ICD-10-CM

## 2023-09-12 DIAGNOSIS — R6521 Severe sepsis with septic shock: Secondary | ICD-10-CM | POA: Diagnosis not present

## 2023-09-12 DIAGNOSIS — A419 Sepsis, unspecified organism: Secondary | ICD-10-CM | POA: Diagnosis not present

## 2023-09-12 LAB — GLUCOSE, CAPILLARY
Glucose-Capillary: 102 mg/dL — ABNORMAL HIGH (ref 70–99)
Glucose-Capillary: 107 mg/dL — ABNORMAL HIGH (ref 70–99)
Glucose-Capillary: 107 mg/dL — ABNORMAL HIGH (ref 70–99)
Glucose-Capillary: 76 mg/dL (ref 70–99)
Glucose-Capillary: 93 mg/dL (ref 70–99)
Glucose-Capillary: 99 mg/dL (ref 70–99)

## 2023-09-12 LAB — AEROBIC/ANAEROBIC CULTURE W GRAM STAIN (SURGICAL/DEEP WOUND): Gram Stain: NONE SEEN

## 2023-09-12 MED ORDER — FUROSEMIDE 10 MG/ML IJ SOLN
40.0000 mg | Freq: Two times a day (BID) | INTRAMUSCULAR | Status: DC
  Administered 2023-09-12 (×2): 40 mg via INTRAVENOUS
  Filled 2023-09-12 (×2): qty 4

## 2023-09-12 MED ORDER — DOXAZOSIN MESYLATE 1 MG PO TABS
1.0000 mg | ORAL_TABLET | Freq: Every day | ORAL | Status: DC
  Administered 2023-09-12 – 2023-09-15 (×3): 1 mg
  Filled 2023-09-12 (×5): qty 1

## 2023-09-12 MED ORDER — TAMSULOSIN HCL 0.4 MG PO CAPS: 0.4000 mg | ORAL_CAPSULE | Freq: Every day | ORAL | Status: DC

## 2023-09-12 MED ORDER — DOXAZOSIN MESYLATE 1 MG PO TABS
1.0000 mg | ORAL_TABLET | Freq: Every day | ORAL | Status: DC
  Filled 2023-09-12: qty 1

## 2023-09-12 NOTE — Progress Notes (Addendum)
 Progress Note   Patient: [name] FMW:[id number] DOB: Apr 23, 1960 DOA: 08/27/2023  DOS: the patient was seen and examined on 09/12/2023   Brief hospital course:  63yo male smoker with hx HTN, COPD, PAF on eliquis , NICM, HFpEF presented 7/14 with 1 week progressive SOB, cough, malaise, poor appetite. On EMS arrival with hypoxic with sats 80% on RA. In ER initially BP was soft with SBP 80's, tachy 130's but responded well to 1L fluids. CXR revealed dense RUL PNA. He was admitted by [facility], started on IV rocephin /azithro. On 7/15 he was still boarding in ER and again having hypotension with SBP 80's and PCCM was consulted.   Assessment and Plan:  Septic shock - Initially requiring vasopressor medications to maintain MAP greater than 65.  Blood cultures, IV fluid bolus, empiric antibiotics on board.  Vasopressor medications weaned off.  Blood pressure stable.  Transferred from PCCM to [facility] 7/30.  Continues on linezolid  plus meropenem .  Acute hypoxic respiratory failure - Continues on 4-5 L nasal cannula.  Able to be weaned down from heated high flow.  Will continue to wean O2 as tolerated.  COPD exacerbation - Possible contributing etiology to hypoxia.  Continue nebulizers.  Acute HFpEF - Likely contributing etiology to patient's hypoxia.  Responding well to IV diuresis last couple days removing 3 L.  Will restart IV Lasix  40 mg twice daily.  Continue to monitor urine output.  Supplemental O2 as above.  Dysphagia - History of achalasia in the past.  NG tube in place.  Modified barium showing retention in the esophagus.  No overt vomiting at this time.  Will start clear liquid diet.  If worsen, can consider consult to GI.  Acute urinary retention - In-N-Out catheter yesterday secondary to retention.  Initiate Flomax  0.4 mg daily.  Monitor closely for retention.  May need repeat I/O or Foley.  Paroxysmal atrial fibrillation - Continue amiodarone , Eliquis .  Telemetry on  board.   Subjective: Patient resting comfortably this morning.  Still having some dyspnea and overall feels weak.  Urine output appears adequate.  Blood pressure stable.  Patient concerned about eating and having difficulty with swallowing food.  NG tube in place.  Denies fever, purulent sputum, chest pain, nausea, vomiting, abdominal pain.  Physical Exam:  Vitals:   09/12/23 0433 09/12/23 0810 09/12/23 0826 09/12/23 0830  BP:  118/65    Pulse:  (!) 110    Resp:  18    Temp: 97.6 F (36.4 C) 97.7 F (36.5 C)    TempSrc: Oral     SpO2: 93% 92% 93% 93%  Weight:      Height:        GENERAL:  Alert, pleasant, no acute distress, ill-appearing HEENT:  EOMI, NG tube, nasal cannula CARDIOVASCULAR:  RRR, no murmurs appreciated RESPIRATORY: Poor air movement bilaterally GASTROINTESTINAL:  Soft, nontender, nondistended EXTREMITIES: Mild BL LE pitting edema NEURO:  No new focal deficits appreciated SKIN:  No rashes noted PSYCH:  Appropriate mood and affect     Data Reviewed:  Imaging Studies: DG Swallowing Func-Speech Pathology Result Date: 09/11/2023 Table formatting from the original result was not included. Images from the original result were not included. Modified Barium Swallow Study Patient Details Name: [name] MRN: [id number] Date of Birth: [birth year] Today's Date: 09/11/2023 HPI/PMH: HPI: [name] is a 63 yo male presenting to ED 7/14 with SOB, cough, malaise, and poor appetite x1 week. CXR showed dense RLL PNA. Complicated by septic shock with pressor needs and transfer to  ICU 7/15. Transferred back to [facility] 7/20 but with worsening hypoxia on [facility] 7/24. CXR 7/24 shows increased R lung opacity, concerning for worsening R PNA. CCM re-consulted and CT Chest pending. Thought to have a primary esophageal dysphagia at the time of previous SLP evaluation 7/15 and had been scheduled for an OP esophageal dilation 7/28 PTA. PMH includes former EtOH abuse, tobacco abuse,  paroxysmal A-fib, chronic HFpEF, nonischemic cardiomyopathy, HTN, HLD, COPD, esophageal dysphagia with history of stricture and dysmotility s/p multiple dilations Clinical Impression: Clinical Impression: Patient presents with an oropharyngeal swallow that is [facility] as per this MBS. Swallow initiated at level of vallecular sinus for liquids and solids. Mild amount of vallecular residuals remained s/p initial swallows with solids and liquids but cleared with subsequent swallows. Two instances of penetration, one flash (PAS 2) and one where penetrate did not immediately clear from laryngeal vestibule (PAS 3) observed with thin liquids. No instances of aspiration occured with any of the tested barium consistencies. PES opening appeared [facility]. During esophageal sweep, appearance of barium stasis observed throughout majority of esophagus with retrograde flow below PES. SLP informed MD of findings and will s/o at this time as patient's dysphagia is primarily esophageal in nature. Factors that may increase risk of adverse event in presence of aspiration Noe & Lianne 2021): Factors that may increase risk of adverse event in presence of aspiration Noe & Lianne 2021): Frail or deconditioned; Presence of tubes (ETT, trach, NG, etc.); Poor general health and/or compromised immunity Recommendations/Plan: Swallowing Evaluation Recommendations Swallowing Evaluation Recommendations Recommendations: PO diet PO Diet Recommendation: -- (thin liquids, and any type of solids; recs per GI) Liquid Administration via: Cup; Straw Postural changes: Position pt fully upright for meals; Stay upright 30-60 min after meals Oral care recommendations: Oral care BID (2x/day) Treatment Plan Treatment Plan Treatment recommendations: No treatment recommended at this time Follow-up recommendations: No SLP follow up Functional status assessment: Patient has not had a recent decline in their functional status. Recommendations Recommendations for  follow up therapy are one component of a multi-disciplinary discharge planning process, led by the attending physician.  Recommendations may be updated based on patient status, additional functional criteria and insurance authorization. Assessment: Orofacial Exam: Orofacial Exam Oral Cavity: Oral Hygiene: WFL Orofacial Anatomy: WFL Oral Motor/Sensory Function: WFL Anatomy: Anatomy: Prominent cricopharyngeus Boluses Administered: Boluses Administered Boluses Administered: Thin liquids (Level 0); Mildly thick liquids (Level 2, nectar thick); Moderately thick liquids (Level 3, honey thick); Puree; Solid  Oral Impairment Domain: Oral Impairment Domain Lip Closure: No labial escape Tongue control during bolus hold: Not tested Bolus preparation/mastication: Timely and efficient chewing and mashing Bolus transport/lingual motion: Brisk tongue motion Oral residue: Complete oral clearance Location of oral residue : N/A Initiation of pharyngeal swallow : Valleculae  Pharyngeal Impairment Domain: Pharyngeal Impairment Domain Soft palate elevation: No bolus between soft palate (SP)/pharyngeal wall (PW) Laryngeal elevation: Complete superior movement of thyroid cartilage with complete approximation of arytenoids to epiglottic petiole Anterior hyoid excursion: Partial anterior movement Epiglottic movement: Complete inversion Laryngeal vestibule closure: Complete, no air/contrast in laryngeal vestibule Pharyngeal stripping wave : Present - complete Pharyngeal contraction (A/P view only): N/A Pharyngoesophageal segment opening: Complete distension and complete duration, no obstruction of flow Tongue base retraction: No contrast between tongue base and posterior pharyngeal wall (PPW) Pharyngeal residue: Collection of residue within or on pharyngeal structures Location of pharyngeal residue: Valleculae  Esophageal Impairment Domain: Esophageal Impairment Domain Esophageal clearance upright position: Esophageal retention with  retrograde flow below pharyngoesophageal segment (PES); Esophageal  retention Pill: Pill Consistency administered: Thin liquids (Level 0) Thin liquids (Level 0): [facility] Penetration/Aspiration Scale Score: Penetration/Aspiration Scale Score 1.  Material does not enter airway: Mildly thick liquids (Level 2, nectar thick); Moderately thick liquids (Level 3, honey thick); Puree; Solid; Pill 2.  Material enters airway, remains ABOVE vocal cords then ejected out: Thin liquids (Level 0) Compensatory Strategies: Compensatory Strategies Compensatory strategies: Yes Straw: Effective Effective Straw: Thin liquid (Level 0)   General Information: No data recorded Diet Prior to this Study: NPO   Temperature : Normal   No data recorded  Supplemental O2: Nasal cannula   History of Recent Intubation: No  Behavior/Cognition: Alert; Cooperative; Pleasant mood Self-Feeding Abilities: Needs assist with self-feeding Baseline vocal quality/speech: Normal Volitional Cough: Able to elicit Volitional Swallow: Able to elicit Exam Limitations: No limitations Goal Planning: Prognosis for improved oropharyngeal function: Good No data recorded No data recorded Patient/Family Stated Goal: none stated Consulted and agree with results and recommendations: Patient Pain: Pain Assessment Pain Assessment: No/denies pain Pain Score: 0 Pain Location: back and abdomen Pain Descriptors / Indicators: Discomfort; Sore Pain Intervention(s): Monitored during session; Repositioned; Patient requesting pain meds-RN notified End of Session: Start Time:SLP Start Time (ACUTE ONLY): 1545 Stop Time: SLP Stop Time (ACUTE ONLY): 1405 Time Calculation:SLP Time Calculation (min) (ACUTE ONLY): 1340 min Charges: SLP Evaluations $ SLP Speech Visit: 1 Visit SLP Evaluations $MBS Swallow: 1 Procedure SLP visit diagnosis: SLP Visit Diagnosis: Dysphagia, pharyngoesophageal phase (R13.14) Past Medical History: Past Medical History: Diagnosis Date  Anxiety   Arthritis   Cardiomyopathy  (HCC)   CHF (congestive heart failure) (HCC)   COPD (chronic obstructive pulmonary disease) (HCC)   Dysphagia   with esophageal stricture  Dysrhythmia   atrial fib  GERD (gastroesophageal reflux disease)   History of blood transfusion   Hypertension  Past Surgical History: Past Surgical History: Procedure Laterality Date  ACETABULAR REVISION Right 05/15/2012  Procedure: ACETABULAR REVISION;  Surgeon: Dempsey LULLA Moan, MD;  Location: [facility];  Service: Orthopedics;  Laterality: Right;  RIGHT HIP BEARING SURFACE VS ACETABULAR REVISION   ESOPHAGOGASTRODUODENOSCOPY    with dilitation  EYE SURGERY Right   cataract extraction with IOL  FLEXIBLE BRONCHOSCOPY Bilateral 09/07/2023  Procedure: BRONCHOSCOPY, FLEXIBLE;  Surgeon: Kara Dorn NOVAK, MD;  Location: [facility]COPY;  Service: Pulmonary;  Laterality: Bilateral;  JOINT REPLACEMENT Bilateral   hips/revision right x 4 Norleen IVAR Blase, MA, CCC-SLP Speech Therapy   DG Chest Port 1 View Result Date: 09/09/2023 CLINICAL DATA:  Hypoxia EXAM: PORTABLE CHEST 1 VIEW COMPARISON:  09/08/2023 FINDINGS: Diffuse airspace disease throughout the right lung. Left lower lobe airspace opacities also noted. Findings similar to prior study. Heart and mediastinal contours within normal limits. Aortic atherosclerosis. No effusions or pneumothorax. Right apical pleural thickening, stable. IMPRESSION: Diffuse right lung airspace disease with right apical pleural thickening, stable. Stable left lower lobe airspace disease. Electronically Signed   By: Franky Crease M.D.   On: 09/09/2023 21:37   US  Abdomen Limited RUQ (LIVER/GB) Result Date: 09/08/2023 CLINICAL DATA:  Elevated liver enzymes. EXAM: ULTRASOUND ABDOMEN LIMITED RIGHT UPPER QUADRANT COMPARISON:  Last study which covers the area was CT chest without contrast 08/27/2023. FINDINGS: Gallbladder: The gallbladder is dilated up 12 cm in length. There is no wall thickening, stones, pericholecystic fluid or positive sonographic Murphy sign.  Common bile duct: Diameter: Prominent measuring 8.7 mm. There is at least mild intrahepatic bile duct dilatation as well. This was not seen on July 14. Liver: No focal lesion identified.  Parenchymal echogenicity is increased consistent with at least mild steatosis. Portal vein is patent on color Doppler imaging with normal direction of blood flow towards the liver. Other: Small layering right pleural effusion. IMPRESSION: 1. Dilated gallbladder with no wall thickening, stones, pericholecystic fluid or positive sonographic Murphy sign. 2. Prominent common bile duct measuring 8.7 mm with at least mild intrahepatic bile duct dilatation as well. This was not seen on July 14. Recommend MRCP or ERCP. 3. At least mild hepatic steatosis. 4. Small layering right pleural effusion. Electronically Signed   By: Francis Quam M.D.   On: 09/08/2023 06:13   DG CHEST PORT 1 VIEW Result Date: 09/08/2023 CLINICAL DATA:  [id number]. Respiratory failure. Extensive right-sided pneumonia. EXAM: PORTABLE CHEST 1 VIEW COMPARISON:  Chest CT and portable chest both 09/06/2023. FINDINGS: Extensive airspace disease in the right lung continues to be seen with small layering pleural effusion and apparently a small loculated effusion in the right apex. There is increased small to moderate size left pleural effusion. The left lung is otherwise clear. There is a normal heart size with stable cardiomediastinal silhouette. Aortic atherosclerosis. No new osseous abnormality.  Degenerative change thoracic spine. Feeding tube extends well into the stomach towards the right new from the prior studies, but the radiopaque tip is out of view. IMPRESSION: 1. Extensive airspace disease in the right lung continues to be seen with small layering pleural effusion and apparently a small loculated effusion in the right apex. 2. Increased small to moderate size left pleural effusion. Left chest otherwise clear. 3. Feeding tube extends well into the stomach towards  the right new from the prior studies, but the radiopaque tip is out of view. 4. Aortic atherosclerosis. Electronically Signed   By: Francis Quam M.D.   On: 09/08/2023 06:07   DG Abd Portable 1V Result Date: 09/07/2023 CLINICAL DATA:  [id number] Encounter for feeding tube placement [id number] EXAM: PORTABLE ABDOMEN - 1 VIEW COMPARISON:  None Available. FINDINGS: Weighted feeding tube terminates in the region of the gastric antrum/duodenal bulb. Nonobstructive bowel gas pattern. Surgical staple line in the right lower quadrant and left pelvis. No pneumoperitoneum. No organomegaly or radiopaque calculi. Multilevel degenerative disc disease of the spine. Bilateral hip arthroplasties, partially visualized. IMPRESSION: Weighted feeding tube terminates in the region of the gastric antrum/duodenal bulb. Continued advancement recommended for postpyloric positioning. Electronically Signed   By: Rogelia Myers M.D.   On: 09/07/2023 15:54   CT CHEST WO CONTRAST Result Date: 09/06/2023 CLINICAL DATA:  Pneumonia EXAM: CT CHEST WITHOUT CONTRAST TECHNIQUE: Multidetector CT imaging of the chest was performed following the standard protocol without IV contrast. RADIATION DOSE REDUCTION: This exam was performed according to the departmental dose-optimization program which includes automated exposure control, adjustment of the mA and/or kV according to patient size and/or use of iterative reconstruction technique. COMPARISON:  Chest x-ray 09/06/2023, CT chest 08/27/2023, 08/29/2021, 09/07/2022 FINDINGS: Cardiovascular: Limited evaluation without intravenous contrast. Moderate aortic atherosclerosis. No aneurysm. Coronary vascular calcification. Normal cardiac size. Trace pericardial effusion. Mediastinum/Nodes: Patent trachea. No suspicious thyroid mass. Mild mediastinal lymph nodes as before. Low right paratracheal node measuring 12 mm, previously 12 mm. Esophagus shows small amount of debris within the mid lumen. Small hiatal hernia  and mild circumferential distal esophageal thickening Lungs/Pleura: Emphysema. Extensive airspace disease and consolidation in the right upper lobe with slightly progressive foci of cavitation in the right upper lobe consolidation. Slightly distorted appearance of consolidation on series 4, image 38. Heterogeneous consolidation and density in the right middle  lobe. Progressive heterogeneous consolidation and airspace disease in the right lower lobe. New small right greater than left pleural effusions. Upper Abdomen: No acute finding in the upper abdomen Musculoskeletal: No acute osseous abnormality IMPRESSION: 1. Extensive airspace disease and consolidation in the right upper lobe with slightly progressive foci of cavitation in the right upper lobe consolidation suggestive of necrosis. Slightly distorted appearance of right upper lobe consolidation, underlying right upper lobe lung mass is not excluded and imaging follow-up to resolution is recommended. Significant progression of airspace disease in the right lower lobe, suspicious for pneumonia. 2. New small right greater than left pleural effusions. 3. Aortic atherosclerosis. Aortic Atherosclerosis (ICD10-I70.0) and Emphysema (ICD10-J43.9). Electronically Signed   By: Luke Bun M.D.   On: 09/06/2023 23:23   DG CHEST PORT 1 VIEW Result Date: 09/06/2023 CLINICAL DATA:  Pneumonia. EXAM: PORTABLE CHEST 1 VIEW COMPARISON:  August 30, 2023.  August 27, 2023. FINDINGS: The heart size and mediastinal contours are within normal limits. Left lung is clear. Diffuse airspace and interstitial opacities are noted throughout the lung which are increased compared to prior exam most consistent with pneumonia. Small right pleural effusion may be present. The visualized skeletal structures are unremarkable. IMPRESSION: Increased right lung opacity is noted diffusely concerning for worsening right-sided pneumonia. Small right pleural effusion may be present. Electronically Signed    By: Lynwood Landy Raddle M.D.   On: 09/06/2023 13:15   VAS US  LOWER EXTREMITY VENOUS (DVT) Result Date: 08/30/2023  Lower Venous DVT Study Patient Name:  JOANNE BRANDER  Date of Exam:   08/30/2023 Medical Rec #: [id number]         Accession #:    [id number] Date of Birth: October 04, 1960          Patient Gender: M Patient Age:   35 years Exam Location:  [facility] Procedure:      VAS US  LOWER EXTREMITY VENOUS (DVT) Referring Phys: TORIBIO SHARPS --------------------------------------------------------------------------------  Indications: Edema.  Comparison Study: No previous exams Performing Technologist: Jody Hill RVT, RDMS  Examination Guidelines: A complete evaluation includes B-mode imaging, spectral Doppler, color Doppler, and power Doppler as needed of all accessible portions of each vessel. Bilateral testing is considered an integral part of a complete examination. Limited examinations for reoccurring indications may be performed as noted. The reflux portion of the exam is performed with the patient in reverse Trendelenburg.  +---------+---------------+---------+-----------+----------+--------------+ RIGHT    CompressibilityPhasicitySpontaneityPropertiesThrombus Aging +---------+---------------+---------+-----------+----------+--------------+ CFV      Full           Yes      Yes                                 +---------+---------------+---------+-----------+----------+--------------+ SFJ      Full                                                        +---------+---------------+---------+-----------+----------+--------------+ FV Prox  Full           Yes      Yes                                 +---------+---------------+---------+-----------+----------+--------------+ FV Mid   Full  Yes      Yes                                 +---------+---------------+---------+-----------+----------+--------------+ FV DistalFull           Yes      Yes                                  +---------+---------------+---------+-----------+----------+--------------+ PFV      Full                                                        +---------+---------------+---------+-----------+----------+--------------+ POP      Full           Yes      Yes                                 +---------+---------------+---------+-----------+----------+--------------+ PTV      Full                                                        +---------+---------------+---------+-----------+----------+--------------+ PERO     Full                                                        +---------+---------------+---------+-----------+----------+--------------+   +---------+---------------+---------+-----------+----------+--------------+ LEFT     CompressibilityPhasicitySpontaneityPropertiesThrombus Aging +---------+---------------+---------+-----------+----------+--------------+ CFV      Full           Yes      Yes                                 +---------+---------------+---------+-----------+----------+--------------+ SFJ      Full                                                        +---------+---------------+---------+-----------+----------+--------------+ FV Prox  Full           Yes      Yes                                 +---------+---------------+---------+-----------+----------+--------------+ FV Mid   Full           Yes      Yes                                 +---------+---------------+---------+-----------+----------+--------------+ FV DistalFull           Yes      Yes                                 +---------+---------------+---------+-----------+----------+--------------+  PFV      Full                                                        +---------+---------------+---------+-----------+----------+--------------+ POP      Full           Yes      Yes                                  +---------+---------------+---------+-----------+----------+--------------+ PTV      Full                                                        +---------+---------------+---------+-----------+----------+--------------+ PERO     Full                                                        +---------+---------------+---------+-----------+----------+--------------+     Summary: BILATERAL: - No evidence of deep vein thrombosis seen in the lower extremities, bilaterally. -No evidence of popliteal cyst, bilaterally.   *See table(s) above for measurements and observations. Electronically signed by Debby Robertson on 08/30/2023 at 5:04:31 PM.    Final    DG Chest Port 1 View Result Date: 08/30/2023 CLINICAL DATA:  Pneumonia EXAM: PORTABLE CHEST 1 VIEW COMPARISON:  08/29/2023 FINDINGS: Stable consolidation of the right upper lobe. Increased right perihilar and infrahilar airspace opacity suspicious for multilobar pneumonia on the right. Interstitial accentuation noted at the right lung base, mildly worsened. Atherosclerotic calcification of the aortic arch. IMPRESSION: 1. Stable consolidation of the right upper lobe. 2. Increased right perihilar and infrahilar airspace opacity suspicious for multilobar pneumonia. 3. Interstitial accentuation at the right lung base, mildly worsened. 4. Aortic Atherosclerosis (ICD10-I70.0). Electronically Signed   By: Ryan Salvage M.D.   On: 08/30/2023 09:04   DG CHEST PORT 1 VIEW Result Date: 08/30/2023 CLINICAL DATA:  Pneumonia, shortness of breath EXAM: PORTABLE CHEST 1 VIEW COMPARISON:  None Available. FINDINGS: 08/30/2023 at 6:12 a.m. persistent dense consolidation of most of the right upper lobe. Hazy right perihilar and infrahilar airspace opacity along with stable indistinct interstitial opacities throughout the remainder of the right lung. The left lung appears clear. Atherosclerotic calcification of the aortic arch. Heart size within normal limits. IMPRESSION:  1. Persistent dense consolidation of most of the right upper lobe, with hazy right perihilar and infrahilar airspace opacity along with indistinct interstitial opacities throughout the remainder of the right lung. Followup chest imaging is recommended in 3-4 weeks following presumed antibiotic therapy to ensure resolution and exclude underlying malignancy. 2. Aortic Atherosclerosis (ICD10-I70.0). Electronically Signed   By: Ryan Salvage M.D.   On: 08/30/2023 09:03   DG Chest Port 1 View Result Date: 08/29/2023 CLINICAL DATA:  Follow-up pneumonia EXAM: PORTABLE CHEST 1 VIEW COMPARISON:  08/27/23 FINDINGS: Stable cardiomediastinal contours. Aortic atherosclerotic calcifications. No significant pleural effusion or signs of pneumothorax. Airspace consolidation is identified within the right upper lobe, persistent right  upper lobe airspace consolidation appears unchanged from the previous exam. Mild increased interstitial markings in the right lower lobe also unchanged. Left lung remains clear. IMPRESSION: 1. No change in right upper lobe airspace consolidation compatible with pneumonia. 2. Stable increased interstitial markings in the right lower lobe. Electronically Signed   By: Waddell Calk M.D.   On: 08/29/2023 05:51   ECHOCARDIOGRAM COMPLETE Result Date: 08/28/2023    ECHOCARDIOGRAM REPORT   Patient Name:   Ermon Sagan Date of Exam: 08/28/2023 Medical Rec #:  [id number]        Height:       69.0 in Accession #:    [id number]       Weight:       190.0 lb Date of Birth:  [birth year]         BSA:          2.021 m Patient Age:    63 years         BP:           85/58 mmHg Patient Gender: M                HR:           111 bpm. Exam Location:  Inpatient Procedure: 2D Echo, Cardiac Doppler and Color Doppler (Both Spectral and Color            Flow Doppler were utilized during procedure). Indications:    Elevated Troponin  History:        Patient has no prior history of Echocardiogram examinations. CHF                  and Cardiomyopathy, CAD, COPD, Arrythmias:Atrial Fibrillation,                 Signs/Symptoms:Dyspnea; Risk Factors:Current Smoker and                 Dyslipidemia.  Sonographer:    Thea Norlander RCS Referring Phys: TERRY LOISE HURST  Sonographer Comments: Image acquisition challenging due to patient behavioral factors., Image acquisition challenging due to patient body habitus and Image acquisition challenging due to uncooperative patient. IMPRESSIONS  1. Left ventricular ejection fraction, by estimation, is 55 to 60%. The left ventricle has normal function. The left ventricle has no regional wall motion abnormalities.  2. Right ventricular systolic function is normal. The right ventricular size is normal. Tricuspid regurgitation signal is inadequate for assessing PA pressure.  3. Left atrial size was moderately dilated.  4. The mitral valve is normal in structure. No evidence of mitral valve regurgitation. No evidence of mitral stenosis.  5. The aortic valve is normal in structure. Aortic valve regurgitation is trivial. Comparison(s): Only apical views were obtained. FINDINGS  Left Ventricle: Left ventricular ejection fraction, by estimation, is 55 to 60%. The left ventricle has normal function. The left ventricle has no regional wall motion abnormalities. The left ventricular internal cavity size was normal in size. There is  no left ventricular hypertrophy. Left ventricular diastolic function could not be evaluated due to atrial fibrillation. Right Ventricle: The right ventricular size is normal. No increase in right ventricular wall thickness. Right ventricular systolic function is normal. Tricuspid regurgitation signal is inadequate for assessing PA pressure. Left Atrium: Left atrial size was moderately dilated. Right Atrium: Right atrial size was normal in size. Pericardium: There is no evidence of pericardial effusion. Mitral Valve: The mitral valve is normal in structure. No evidence of mitral valve  regurgitation. No evidence  of mitral valve stenosis. Tricuspid Valve: The tricuspid valve is normal in structure. Tricuspid valve regurgitation is mild. Aortic Valve: The aortic valve is normal in structure. Aortic valve regurgitation is trivial. Aortic valve peak gradient measures 5.6 mmHg. Pulmonic Valve: The pulmonic valve was not well visualized. Aorta: The aortic root was not well visualized. Venous: The inferior vena cava was not well visualized. IAS/Shunts: No atrial level shunt detected by color flow Doppler.   Diastology LV e' medial:    11.40 cm/s LV E/e' medial:  6.4 LV e' lateral:   15.70 cm/s LV E/e' lateral: 4.7  RIGHT VENTRICLE RV S prime:     18.60 cm/s TAPSE (M-mode): 1.2 cm LEFT ATRIUM           Index        RIGHT ATRIUM           Index LA Vol (A4C): 56.6 ml 28.00 ml/m  RA Area:     16.30 cm                                    RA Volume:   41.90 ml  20.73 ml/m  AORTIC VALVE AV Vmax:      118.00 cm/s AV Peak Grad: 5.6 mmHg LVOT Vmax:    121.00 cm/s LVOT Vmean:   76.500 cm/s LVOT VTI:     0.165 m MITRAL VALVE               TRICUSPID VALVE MV Area (PHT): 3.62 cm    TR Peak grad:   19.4 mmHg MV Decel Time: 210 msec    TR Vmax:        220.00 cm/s MV E velocity: 73.40 cm/s MV A velocity: 52.10 cm/s  SHUNTS MV E/A ratio:  1.41        Systemic VTI: 0.16 m Jerel Croitoru MD Electronically signed by Jerel Balding MD Signature Date/Time: 08/28/2023/3:58:50 PM    Final    CT CHEST WO CONTRAST Result Date: 08/27/2023 CLINICAL DATA:  Increased right apical airspace opacity EXAM: CT CHEST WITHOUT CONTRAST TECHNIQUE: Multidetector CT imaging of the chest was performed following the standard protocol without IV contrast. RADIATION DOSE REDUCTION: This exam was performed according to the departmental dose-optimization program which includes automated exposure control, adjustment of the mA and/or kV according to patient size and/or use of iterative reconstruction technique. COMPARISON:  Chest x-ray from earlier  in the same day. FINDINGS: Cardiovascular: Atherosclerotic calcifications of the thoracic aorta are noted. Absence of contrast material somewhat limits the vascular evaluation. Heart is not significantly enlarged in size. Heavy coronary calcifications are noted. Mediastinum/Nodes: Thoracic inlet is within normal limits. No hilar or mediastinal adenopathy is noted. At least two tablets are noted within the mid to distal esophagus. Lungs/Pleura: Significant emphysematous changes are noted. Left lung is well aerated. Right lung is well aerated but demonstrate significant airspace consolidation. Patchy airspace opacity is noted in the right middle and right lower lobes medially. These changes are felt to represent extensive pneumonia. The possibility of a neoplasm however cannot be totally excluded. No sizable effusion is seen. Upper Abdomen: No acute abnormality. Musculoskeletal: No chest wall mass or suspicious bone lesions identified. IMPRESSION: Extensive airspace consolidation in the right upper lobe with lesser changes in the right middle and lower lobes consistent with multifocal infiltrate. Short-term follow-up following appropriate therapy is recommended to assess for resolution. Aortic Atherosclerosis (ICD10-I70.0) and Emphysema (ICD10-J43.9).  Electronically Signed   By: Oneil Devonshire M.D.   On: 08/27/2023 23:07   DG Chest Port 1 View Result Date: 08/27/2023 CLINICAL DATA:  Shortness of breath for 2-3 days. History of congestive heart failure and COPD. EXAM: PORTABLE CHEST 1 VIEW COMPARISON:  Radiographs 04/04/2018 and 11/11/2015. Chest CT 09/07/2022. FINDINGS: 1524 hours. Two AP semi erect views of the chest are submitted. There is new dense right upper lobe airspace disease with associated volume loss. The left lung appears clear. No evidence of pleural effusion or pneumothorax. The visualized heart size and mediastinal contours are stable with aortic atherosclerosis. No acute osseous findings are evident.  Telemetry leads overlie the chest. IMPRESSION: New dense right upper lobe airspace disease with associated volume loss, suspicious for pneumonia. Differential includes reactivation tuberculosis and malignancy. Correlate clinically. Followup PA and lateral chest X-ray is recommended in 4-6 weeks following appropriate therapy to ensure resolution and exclude underlying malignancy. Electronically Signed   By: Elsie Perone M.D.   On: 08/27/2023 15:37    There are no new results to review at this time.  Previous records (including but not limited to H&P, progress notes, nursing notes, TOC management) were reviewed in assessment of this patient.  Labs: CBC: Recent Labs  Lab 09/06/23 1240 09/07/23 1317 09/08/23 0423 09/09/23 0442 09/10/23 0351  WBC 20.2* 16.1* 15.5* 18.2* 14.3*  NEUTROABS 17.3* 14.1*  --  16.1* 11.9*  HGB 10.9* 10.8* 10.2* 9.2* 9.2*  HCT 34.7* 34.9* 31.6* 28.9* 29.0*  MCV 85.5 85.5 84.3 83.3 84.3  PLT 294 234 246 217 219   Basic Metabolic Panel: Recent Labs  Lab 09/07/23 1446 09/08/23 0423 09/08/23 1205 09/09/23 0442 09/09/23 0635 09/09/23 1548 09/10/23 0351 09/10/23 1311 09/11/23 0327  NA  --   --    < > 134* 134* 135 133* 136 137  K  --   --    < > 5.5* 5.7*  5.7* 5.5* 5.8* 5.8* 5.4*  CL  --   --    < > 98 97* 98 97* 98 98  CO2  --   --    < > 28 28 30 29 31  33*  GLUCOSE  --   --    < > 113* 82 113* 108* 100* 104*  BUN  --   --    < > 32* 32* 38* 37* 36* 30*  CREATININE  --   --    < > 1.10 1.08 1.24 1.20 1.16 0.97  CALCIUM   --   --    < > 8.3* 8.3* 8.1* 8.3* 8.5* 8.4*  MG 1.9 2.3  --  2.2  --   --  2.3  --   --   PHOS 3.1 2.9  --  2.7  --   --  3.1  --   --    < > = values in this interval not displayed.   Liver Function Tests: Recent Labs  Lab 09/08/23 1205 09/09/23 0442 09/10/23 0351  AST 25 38 47*  ALT 42 50* 62*  ALKPHOS 74 77 78  BILITOT 0.4 0.2 0.2  PROT 5.4* 5.7* 5.5*  ALBUMIN  2.9* 2.9* 2.8*   CBG: Recent Labs  Lab 09/11/23 1129  09/11/23 1625 09/12/23 0044 09/12/23 0446 09/12/23 0811  GLUCAP 94 83 76 93 107*    Scheduled Meds:  amiodarone   200 mg Per Tube Daily   apixaban   5 mg Per Tube BID   arformoterol   15 mcg Nebulization BID   atorvastatin   40 mg Per Tube Daily   bethanechol   10 mg Oral TID   Chlorhexidine  Gluconate Cloth  6 each Topical Daily   feeding supplement (PROSource TF20)  60 mL Per Tube Daily   insulin  aspart  0-9 Units Subcutaneous Q4H   multivitamin with minerals  1 tablet Per Tube Daily   mouth rinse  15 mL Mouth Rinse 4 times per day   pantoprazole  (PROTONIX ) IV  40 mg Intravenous Q12H   revefenacin   175 mcg Nebulization Daily   sodium chloride  flush  10-40 mL Intracatheter Q12H   sodium zirconium cyclosilicate   10 g Per Tube TID   thiamine   100 mg Per Tube Daily   Continuous Infusions:  feeding supplement (OSMOLITE 1.5 CAL) 55 mL/hr at 09/11/23 2300   linezolid  (ZYVOX ) IV 600 mg (09/12/23 0854)   meropenem  (MERREM ) IV 1 g (09/12/23 0607)   PRN Meds:.diazepam , fluticasone , guaiFENesin -dextromethorphan , hydrOXYzine , ipratropium-albuterol , lip balm, melatonin, mouth rinse, oxyCODONE -acetaminophen , phenol, polyethylene glycol, prochlorperazine , sodium chloride   Family Communication: None at bedside  Disposition: Status is: Inpatient Remains inpatient appropriate because: Acute hypoxic respiratory failure, HFPEF exacerbation     Time spent: 50 minutes  Length of inpatient stay: 16 days  Author: Carliss LELON Canales, DO 09/12/2023 11:41 AM  For on call review [email or web address].

## 2023-09-12 NOTE — Progress Notes (Signed)
 Nutrition Follow-up  DOCUMENTATION CODES:   Non-severe (moderate) malnutrition in context of chronic illness  INTERVENTION:  -Continue CL diet as ordered -Assess PO intake, will assess decreasing EN tomorrow @ f/u -Continue MVI w/min -Continue Osmolite @ goal rate of 55 ml/hr via Cortrak -60ml ProSource TF20 once daily Provides 2060 kcal, 103g protein, free water  daily    NUTRITION DIAGNOSIS:   Moderate Malnutrition related to chronic illness (CHF, COPD) as evidenced by mild fat depletion, mild muscle depletion, moderate muscle depletion, edema.  Ongoing  GOAL:   Patient will meet greater than or equal to 90% of their needs  Met via EN  MONITOR:   TF tolerance, Diet advancement, I & O's, Labs  REASON FOR ASSESSMENT:   Consult Enteral/tube feeding initiation and management  ASSESSMENT:   Pt with hx of former alcohol abuse and tobacco abuse, CHF, COPD, HTN, HLD, and atrial fibrillation presented to ED with worsening SOB and poor appetite. Found to be septic on admission related to PNA.  7/14 - presented to ED 7/15 - admitted to [facility], SLP evaluation (regular, thin) 7/19 - transferred to 4E 7/24 - SLE BSE, DYS3/thin 7/25 - bronchoscopy, transferred to ICU post-procedure; NPO; Cortrak placed 7/29 MBS showed esophageal dysphagia r/t stricture, dysmotility. SLP signed off. Diet upgraded to CL  Attempted to speak to pt at bedside. Pt sleeping and did not rouse to knocking/voice attempts; continues to snore. Pt with no noted s/sx of EN intolerance. Last BM 7/30. Pt upgraded to CL diet, 1 meal @ 20% documented. Per MD note, pt is worried about PO intake with dysphagia. Will continue to assess PO intake and possibly decrease EN tomorrow? Weight overall increased from admission, though, seems to be stabilizing. Continue PS and MVI. F/u tomorrow. Will continue to monitor, RDN available prn.   Labs Potassium CO2 33 BG 104 BUN 30 Calcium  8.4 Albumin  2.8 AST 47 ALT  62 A1c 5.6  Medications  amiodarone   200 mg Per Tube Daily   apixaban   5 mg Per Tube BID   arformoterol   15 mcg Nebulization BID   atorvastatin   40 mg Per Tube Daily   bethanechol   10 mg Oral TID   Chlorhexidine  Gluconate Cloth  6 each Topical Daily   doxazosin   1 mg Per Tube Daily   feeding supplement (PROSource TF20)  60 mL Per Tube Daily   furosemide   40 mg Intravenous BID   insulin  aspart  0-9 Units Subcutaneous Q4H   multivitamin with minerals  1 tablet Per Tube Daily   mouth rinse  15 mL Mouth Rinse 4 times per day   pantoprazole  (PROTONIX ) IV  40 mg Intravenous Q12H   revefenacin   175 mcg Nebulization Daily   sodium chloride  flush  10-40 mL Intracatheter Q12H   sodium zirconium cyclosilicate   10 g Per Tube TID   thiamine   100 mg Per Tube Daily     NUTRITION - FOCUSED PHYSICAL EXAM:  Flowsheet Row Most Recent Value  Orbital Region Unable to assess  [HHFNC]  Upper Arm Region Mild depletion  Thoracic and Lumbar Region Unable to assess  [edema]  Buccal Region Moderate depletion  Temple Region Severe depletion  Clavicle Bone Region Moderate depletion  Clavicle and Acromion Bone Region Moderate depletion  Scapular Bone Region Mild depletion  Dorsal Hand Unable to assess  [L hand moderate edema]  Patellar Region Mild depletion  Anterior Thigh Region Mild depletion  Posterior Calf Region Unable to assess  [severe pitting edema]  Edema (RD Assessment)  Severe  Hair Reviewed  Eyes Reviewed  Mouth Reviewed  Skin Reviewed  Nails Reviewed    Diet Order:   Diet Order             Diet clear liquid Room service appropriate? Yes; Fluid consistency: Thin  Diet effective now                   EDUCATION NEEDS:   Not appropriate for education at this time  Skin:  Skin Assessment: Reviewed RN Assessment  Last BM:  7/29 type 6 large  Height:   Ht Readings from Last 1 Encounters:  08/27/23 5' 9 (1.753 m)    Weight:   Wt Readings from Last 1 Encounters:   09/12/23 88 kg    Ideal Body Weight:  72.7 kg  BMI:  Body mass index is 28.65 kg/m.  Estimated Nutritional Needs:   Kcal:  2000-2200 kcal/d  Protein:  100-120 g/d  Fluid:  2-2.2L/d   [name], RDN, LDN Registered Dietitian Nutritionist RD Inpatient Contact Info in [place]

## 2023-09-12 NOTE — Progress Notes (Addendum)
 PT Cancellation Note  Patient Details Name: [name] MRN: [id number] DOB: 08/16/1960   Cancelled Treatment:    Reason Eval/Treat Not Completed:   12:27- Patient declined stating that he was tired and did not want to have therapy at this time. Acute PT to re-attempt as schedule allows.  15:55- Pt sleeping and difficult to arouse. Acute PT to follow.   Kate ORN, PT, DPT Secure Chat Preferred  Rehab Office [phone]  Kate BRAVO [name] 09/12/2023, 12:27 PM

## 2023-09-12 NOTE — TOC Progression Note (Addendum)
 Transition of Care [facility]) - Progression Note    Patient Details  Name: [name] MRN: [id number] Date of Birth: [birth year]  [facility] of [facility]) CM/SW Contact  Egon Dittus A [place], LCSW Phone Number: 09/12/2023, 11:29 AM  Clinical Narrative:     CSW met with pt at bedside. He said that he would prefer home but was agreeable for CSW to work up up for SNF at discharge. Pt lives alone, not sure if family can assist him if he returns home with home health at DC.   CSW  to send out referral in [place] and [place] areas. Pt currently has coretrak, CSW to send out once coretrak is removed as pt cannot DC to facility with it to SNF.   CSW will continue to follow.   Expected Discharge Plan: Home w Home Health Services Barriers to Discharge: Continued Medical Work up               Expected Discharge Plan and Services In-house Referral: NA Discharge Planning Services: CM Consult Post Acute Care Choice: Durable Medical Equipment, Home Health Living arrangements for the past 2 months: Mobile Home                   DME Agency: NA       HH Arranged: PT, OT HH Agency: [facility] Date [facility] Agency Contacted: 09/06/23 Time HH Agency Contacted: 1414 Representative spoke with at [facility] Agency: Amy   Social Drivers of Health (SDOH) Interventions SDOH Screenings   Food Insecurity: No Food Insecurity (08/28/2023)  Housing: Low Risk  (08/28/2023)  Transportation Needs: No Transportation Needs (08/28/2023)  Utilities: Not At Risk (08/28/2023)  Tobacco Use: High Risk (09/07/2023)    Readmission Risk Interventions    08/30/2023    1:18 PM  Readmission Risk Prevention Plan  Transportation Screening Complete  HRI or Home Care Consult Complete  Social Work Consult for Recovery Care Planning/Counseling Complete  Palliative Care Screening Not Applicable  Medication Review Oceanographer) Referral to Pharmacy

## 2023-09-12 NOTE — Progress Notes (Signed)
   NAME:  [name], MRN:  [id number], DOB:  1961-01-18, LOS: 16 ADMISSION DATE:  08/27/2023, CONSULTATION DATE:  7/15 REFERRING MD:  TRH, CHIEF COMPLAINT:  hypotension    History of Present Illness:  63yo male smoker with hx HTN, COPD, PAF on eliquis , NICM, HFpEF presented 7/14 with 1 week progressive SOB, cough, malaise, poor appetite. On EMS arrival with hypoxic with sats 80% on RA. In ER initially BP was soft with SBP 80's, tachy 130's but responded well to 1L fluids. CXR revealed dense RUL PNA. He was admitted by TRH, started on IV rocephin /azithro. On 7/15 he was still boarding in ER and again having hypotension with SBP 80's and PCCM was consulted.  Lactate 1.4 WBC 34  Pertinent  Medical History   has a past medical history of Anxiety, Arthritis, Cardiomyopathy (HCC), CHF (congestive heart failure) (HCC), COPD (chronic obstructive pulmonary disease) (HCC), Dysphagia, Dysrhythmia, GERD (gastroesophageal reflux disease), History of blood transfusion, and Hypertension.  Significant Hospital Events: Including procedures, antibiotic start and stop dates in addition to other pertinent events   CT chest 7/14>> Extensive airspace consolidation in the right upper lobe with lesser changes in the right middle and lower lobes consistent with multifocal infiltrate. Short-term follow-up following appropriate therapy is recommended to assess for resolution. Echo 7/15>>>LVEF 55-60% no WMA, LA dilated 7/17 Worsening resp status this am, feels like he cannot catch breath, anxious as well. Got lasix , HHFO2, dex for restlessness.BLEUS neg for DVT 7/18 breathing better 70% FiO2 on heated high flow adding additional pulmonary hygiene measures 7/24 PCCM re-consulted due to increasing O2 needs and worsening chest x-ray 7/25 bronched. Txf to ICU post case w incr O2. Cortrak. RUQ US    7/26 HHFNC  Interim History / Subjective:   Remains on oxygen supplementation  Objective    Blood pressure 118/78,  pulse (!) 102, temperature 97.7 F (36.5 C), resp. rate 18, height 5' 9 (1.753 m), weight 88 kg, SpO2 91%.        Intake/Output Summary (Last 24 hours) at 09/12/2023 1831 Last data filed at 09/12/2023 0902 Gross per 24 hour  Intake 671.76 ml  Output 1650 ml  Net -978.24 ml   Filed Weights   09/10/23 0401 09/11/23 0304 09/12/23 0412  Weight: 88.1 kg 88 kg 88 kg    Examination: General: Chronically ill-appearing, frail HEENT: Dry oral mucosa Pulmonary: Bilateral rales Cardiac: S1-S2 appreciated Abdomen: Bowel sounds appreciated Extremities: No clubbing, no edema Neuro: Awake alert interactive, nonfocal  I reviewed labs  Resolved problem list  Septic shock Assessment and Plan   Septic shock resolved - Plan to stop antibiotics at 7 days - BAL negative  Hypoxemic respiratory failure - Continue oxygen supplementation Advanced chronic obstructive pulmonary disease - Continue pulmonary hygiene  Heart failure with preserved ejection fraction Coronary artery disease History of paroxysmal atrial fibrillation - On amiodarone , Eliquis   Stage III chronic kidney disease  Urinary retention  Risk of decompensation remains significant as he does not have significant reserves

## 2023-09-12 NOTE — Plan of Care (Signed)

## 2023-09-13 DIAGNOSIS — J449 Chronic obstructive pulmonary disease, unspecified: Secondary | ICD-10-CM | POA: Diagnosis not present

## 2023-09-13 DIAGNOSIS — J9601 Acute respiratory failure with hypoxia: Secondary | ICD-10-CM | POA: Diagnosis not present

## 2023-09-13 DIAGNOSIS — R6521 Severe sepsis with septic shock: Secondary | ICD-10-CM | POA: Diagnosis not present

## 2023-09-13 DIAGNOSIS — K220 Achalasia of cardia: Secondary | ICD-10-CM | POA: Diagnosis not present

## 2023-09-13 DIAGNOSIS — I5033 Acute on chronic diastolic (congestive) heart failure: Secondary | ICD-10-CM | POA: Diagnosis not present

## 2023-09-13 DIAGNOSIS — A419 Sepsis, unspecified organism: Secondary | ICD-10-CM | POA: Diagnosis not present

## 2023-09-13 LAB — COMPREHENSIVE METABOLIC PANEL WITH GFR
ALT: 50 U/L — ABNORMAL HIGH (ref 0–44)
AST: 41 U/L (ref 15–41)
Albumin: 2.1 g/dL — ABNORMAL LOW (ref 3.5–5.0)
Alkaline Phosphatase: 83 U/L (ref 38–126)
Anion gap: 6 (ref 5–15)
BUN: 22 mg/dL (ref 8–23)
CO2: 30 mmol/L (ref 22–32)
Calcium: 7.8 mg/dL — ABNORMAL LOW (ref 8.9–10.3)
Chloride: 100 mmol/L (ref 98–111)
Creatinine, Ser: 0.82 mg/dL (ref 0.61–1.24)
GFR, Estimated: >60 mL/min (ref >60)
Glucose, Bld: 108 mg/dL — ABNORMAL HIGH (ref 70–99)
Potassium: 4 mmol/L (ref 3.5–5.1)
Sodium: 136 mmol/L (ref 135–145)
Total Bilirubin: 0.8 mg/dL (ref 0.0–1.2)
Total Protein: 4.9 g/dL — ABNORMAL LOW (ref 6.5–8.1)

## 2023-09-13 LAB — CBC
HCT: 29.3 % — ABNORMAL LOW (ref 39.0–52.0)
HCT: 29.9 % — ABNORMAL LOW (ref 39.0–52.0)
Hemoglobin: 9.4 g/dL — ABNORMAL LOW (ref 13.0–17.0)
Hemoglobin: 9.6 g/dL — ABNORMAL LOW (ref 13.0–17.0)
MCH: 26.4 pg (ref 26.0–34.0)
MCH: 26.5 pg (ref 26.0–34.0)
MCHC: 32.1 g/dL (ref 30.0–36.0)
MCHC: 32.1 g/dL (ref 30.0–36.0)
MCV: 82.4 fL (ref 80.0–100.0)
MCV: 82.5 fL (ref 80.0–100.0)
Platelets: 180 K/uL (ref 150–400)
Platelets: 184 K/uL (ref 150–400)
RBC: 3.55 MIL/uL — ABNORMAL LOW (ref 4.22–5.81)
RBC: 3.63 MIL/uL — ABNORMAL LOW (ref 4.22–5.81)
RDW: 19.9 % — ABNORMAL HIGH (ref 11.5–15.5)
RDW: 19.9 % — ABNORMAL HIGH (ref 11.5–15.5)
WBC: 10.5 K/uL (ref 4.0–10.5)
WBC: 9.3 K/uL (ref 4.0–10.5)
nRBC: 0 % (ref 0.0–0.2)
nRBC: 0 % (ref 0.0–0.2)

## 2023-09-13 LAB — BASIC METABOLIC PANEL WITH GFR
Anion gap: 5 (ref 5–15)
BUN: 22 mg/dL (ref 8–23)
CO2: 28 mmol/L (ref 22–32)
Calcium: 7.8 mg/dL — ABNORMAL LOW (ref 8.9–10.3)
Chloride: 104 mmol/L (ref 98–111)
Creatinine, Ser: 0.87 mg/dL (ref 0.61–1.24)
GFR, Estimated: >60 mL/min (ref >60)
Glucose, Bld: 125 mg/dL — ABNORMAL HIGH (ref 70–99)
Potassium: 3.8 mmol/L (ref 3.5–5.1)
Sodium: 137 mmol/L (ref 135–145)

## 2023-09-13 LAB — GLUCOSE, CAPILLARY
Glucose-Capillary: 103 mg/dL — ABNORMAL HIGH (ref 70–99)
Glucose-Capillary: 116 mg/dL — ABNORMAL HIGH (ref 70–99)
Glucose-Capillary: 116 mg/dL — ABNORMAL HIGH (ref 70–99)
Glucose-Capillary: 125 mg/dL — ABNORMAL HIGH (ref 70–99)
Glucose-Capillary: 153 mg/dL — ABNORMAL HIGH (ref 70–99)
Glucose-Capillary: 88 mg/dL (ref 70–99)
Glucose-Capillary: 90 mg/dL (ref 70–99)

## 2023-09-13 LAB — LACTIC ACID, PLASMA: Lactic Acid, Venous: 1 mmol/L (ref 0.5–1.9)

## 2023-09-13 LAB — MAGNESIUM: Magnesium: 1.8 mg/dL (ref 1.7–2.4)

## 2023-09-13 NOTE — Progress Notes (Signed)
 Nutrition Follow-up  DOCUMENTATION CODES:   Non-severe (moderate) malnutrition in context of chronic illness  INTERVENTION:  -Continue CL diet as ordered -Assess PO intake, will assess decreasing EN prn. At this time continue with EN meeting 100% of pt's needs.  -Continue MVI w/min -Continue Osmolite @ goal rate of 55 ml/hr via Cortrak -60ml ProSource TF20 once daily Provides 2060 kcal, 103g protein, free water  daily   NUTRITION DIAGNOSIS:   Moderate Malnutrition related to chronic illness (CHF, COPD) as evidenced by mild fat depletion, mild muscle depletion, moderate muscle depletion, edema.  Ongoing  GOAL:   Patient will meet greater than or equal to 90% of their needs  Met with EN  MONITOR:   TF tolerance, Diet advancement, I & O's, Labs  REASON FOR ASSESSMENT:   Consult Enteral/tube feeding initiation and management  ASSESSMENT:   Pt with hx of former alcohol abuse and tobacco abuse, CHF, COPD, HTN, HLD, and atrial fibrillation presented to ED with worsening SOB and poor appetite. Found to be septic on admission related to PNA.  Spoke with pt at bedside. Pt only awake long enough to say he didn't remember if he had anything for breakfast or lunch today. Spoke to nursing, had ~197ml this morning and nothing for lunch. Pt not staying awake long enough to get PO intake in. No noted s/sx of EN intolerance. Last BM 7/30. Continue with enteral nutrition to meet 100% of pt's needs at this time, will continue to monitor for ability to decrease EN with increased PO intake prn.  Pt's documented weight changes since admission (7/14) include gaining from 77.8 kg to 91.3 kg and now losing to 83.8 kg. Suspect fluid fluctuations and/or scale inaccuracies present. No changes to nutritional POC at this time, will continue to monitor, RDN available prn.   Labs BG 88-153 Calcium  7.8 Albumin  2.1 ALT 50 H/H 9.6/29.9 A1c 5.6  Medications  amiodarone   200 mg Per Tube Daily    apixaban   5 mg Per Tube BID   arformoterol   15 mcg Nebulization BID   atorvastatin   40 mg Per Tube Daily   bethanechol   10 mg Oral TID   Chlorhexidine  Gluconate Cloth  6 each Topical Daily   doxazosin   1 mg Per Tube Daily   feeding supplement (PROSource TF20)  60 mL Per Tube Daily   insulin  aspart  0-9 Units Subcutaneous Q4H   multivitamin with minerals  1 tablet Per Tube Daily   mouth rinse  15 mL Mouth Rinse 4 times per day   pantoprazole  (PROTONIX ) IV  40 mg Intravenous Q12H   revefenacin   175 mcg Nebulization Daily   sodium chloride  flush  10-40 mL Intracatheter Q12H   thiamine   100 mg Per Tube Daily     NUTRITION - FOCUSED PHYSICAL EXAM:  Flowsheet Row Most Recent Value  Orbital Region Unable to assess  [HHFNC]  Upper Arm Region Mild depletion  Thoracic and Lumbar Region Unable to assess  [edema]  Buccal Region Moderate depletion  Temple Region Severe depletion  Clavicle Bone Region Moderate depletion  Clavicle and Acromion Bone Region Moderate depletion  Scapular Bone Region Mild depletion  Dorsal Hand Unable to assess  [L hand moderate edema]  Patellar Region Mild depletion  Anterior Thigh Region Mild depletion  Posterior Calf Region Unable to assess  [severe pitting edema]  Edema (RD Assessment) Severe  Hair Reviewed  Eyes Reviewed  Mouth Reviewed  Skin Reviewed  Nails Reviewed    Diet Order:   Diet  Order             Diet clear liquid Room service appropriate? Yes; Fluid consistency: Thin  Diet effective now                   EDUCATION NEEDS:   Not appropriate for education at this time  Skin:  Skin Assessment: Reviewed RN Assessment  Last BM:  7/30 type 7  Height:   Ht Readings from Last 1 Encounters:  08/27/23 5' 9 (1.753 m)    Weight:   Wt Readings from Last 1 Encounters:  09/13/23 83.8 kg    Ideal Body Weight:  72.7 kg  BMI:  Body mass index is 27.28 kg/m.  Estimated Nutritional Needs:   Kcal:  2000-2200 kcal/d  Protein:   100-120 g/d  Fluid:  2-2.2L/d    Nadalie Laughner Daml-Budig, RDN, LDN Registered Dietitian Nutritionist RD Inpatient Contact Info in [place]

## 2023-09-13 NOTE — Hospital Course (Addendum)
 63yo male smoker with hx HTN, COPD, PAF on eliquis , NICM, HFpEF presented 7/14 with 1 week progressive SOB, cough, malaise, poor appetite. On EMS arrival with hypoxic with sats 80% on RA. In ER initially BP was soft with SBP 80's, tachy 130's but responded well to 1L fluids. CXR revealed dense RUL PNA. He was admitted by [facility], started on IV rocephin /azithro. On 7/15 he was still boarding in ER and again having hypotension with SBP 80's and PCCM was consulted.    Assessment and Plan:   Septic shock - Initially requiring vasopressor medications to maintain MAP greater than 65.  Blood cultures, IV fluid bolus, empiric antibiotics on board.  Vasopressor medications weaned off.  Blood pressure stable.  Transferred from PCCM to [facility] 7/30.  Continues on linezolid  plus meropenem  (to complete 7 days).   Acute hypoxic respiratory failure - Continues on 4-5 L nasal cannula.  Able to be weaned down from heated high flow.  Will continue to wean O2 as tolerated.   COPD exacerbation - Possible contributing etiology to hypoxia.  Continue nebulizers.   Acute HFpEF - Likely contributing etiology to patient's hypoxia.  Responding well to IV diuresis.  Given mild hypotension will hold IV Lasix  for today.  Continue to monitor urine output.  Supplemental O2 as above.   Dysphagia - History of achalasia in the past.  NG tube in place.  Modified barium showing retention in the esophagus.  No overt vomiting at this time.  Continues on clear liquid diet.  Awaiting improvement in hypoxia and hypotension prior to consulting GI.     Acute urinary retention - In&Out catheter 7/29 secondary to retention.  Initiate Flomax  0.4 mg daily.  Monitor closely for retention.  May need repeat I/O or Foley.   Paroxysmal atrial fibrillation - Continue amiodarone , Eliquis .  Telemetry on board.   Moderate protein calorie malnutrition - Dietary on board.  Currently has NG tube in place receiving tube feeds.   Physical debilitation  muscle weakness - PT/OT following.  Given patient's hospitalization, likely that he will need rehab once his hypoxia and illness improved.

## 2023-09-13 NOTE — Progress Notes (Signed)
 Physical Therapy Treatment Patient Details Name: [name] MRN: [id number] DOB: August 06, 1960 Today's Date: 09/13/2023   History of Present Illness Pt is a 63 yr old male who presented 08/27/23 due to SOB, decrease in appetite. Pt the went into septic shock and was transferred into ICU and transferred out on 09/02/23.  Increased O2 requirement on 7/25 and noted chest CT with cavitation of R UL suggestive of necrosis so pt underwent bronchoscopy and transferred back to ICU with HHFNC to 35L 94% FiO2. PMH: HTN, COPD, PAF on eliquis , NICM, HFpEF    PT Comments  Pt needed encouragement to participate due to not feeling well today, reporting frequent bouts of diarrhea. He was agreeable to sitting up EOB to perform exercises and to try to improve pulmonary function, but upon sitting EOB he had to have a BM again. Pt transferred to commode with min-modA and HHA, displaying deficits in power, strength, balance, and endurance. Pt sat up on the commode ~20 minutes, which assisted in challenging his pulmonary function. See SpO2 levels below. Notified RN of sats, pt report of frequent diarrhea, and pt report of feeling clogged up in the nostrils which is impacting him from breathing in the supplemental O2. Educated pt on AROM of legs as able/tolerated when supine and not with PT and on use of provided IS 10x/hour to improve pulmonary function. He verbalized understanding. He was unable to progress OOB mobility further or perform exercises due to frequent diarrhea and fatigue this date. Will continue to follow acutely.  SpO2 88% on 5L at start of session with pt resting supine, down to low 80s% with mobility on 5L, up to 89% on 7L resting supine end of session   If plan is discharge home, recommend the following: Help with stairs or ramp for entrance;Assistance with cooking/housework;Assist for transportation;A lot of help with walking and/or transfers;A lot of help with bathing/dressing/bathroom   Can travel by  private vehicle     No  Equipment Recommendations  Other (comment) (TBA)    Recommendations for Other Services       Precautions / Restrictions Precautions Precautions: Fall Precaution/Restrictions Comments: watch SpO2 and BP; cortrak Restrictions Weight Bearing Restrictions Per Provider Order: No     Mobility  Bed Mobility Overal bed mobility: Needs Assistance Bed Mobility: Supine to Sit, Sit to Supine     Supine to sit: Contact guard, HOB elevated, Used rails Sit to supine: Contact guard assist, HOB elevated, Used rails   General bed mobility comments: CGA for safety with bed mobility with pt relying on bed rails with HOB elevated. MaxA to scoot pt superiorly in bed when placing bed in trendelenburg position.    Transfers Overall transfer level: Needs assistance Equipment used: 1 person hand held assist Transfers: Sit to/from Stand, Bed to chair/wheelchair/BSC Sit to Stand: Min assist, Mod assist Stand pivot transfers: Mod assist         General transfer comment: Cued pt to hold onto therapist for support with stand and pivot, 1x to R EOB > commode, 1x to L commode > EOB. MinA-modA to power up to stand and modA for balance to pivot. Pt maintains a flexed posture.    Ambulation/Gait               General Gait Details: deferred due to frequent diarrhea with mobility this date   Stairs             Wheelchair Mobility     Tilt Bed    Modified  Rankin (Stroke Patients Only)       Balance Overall balance assessment: Needs assistance Sitting-balance support: No upper extremity supported, Feet supported Sitting balance-[name]: Fair Sitting balance - Comments: sits EOB several minutes and then on commode ~20 min with intermittent UE support without LOB   Standing balance support: Bilateral upper extremity supported, During functional activity Standing balance-[name]: Poor Standing balance comment: reliant on UE support and external  physical assistance                            Communication Communication Communication: No apparent difficulties  Cognition Arousal: Alert Behavior During Therapy: Flat affect   PT - Cognitive impairments: No apparent impairments                       PT - Cognition Comments: Pt understandably frustrated with frequent diarrhea and not feeling well. Needed encouragement to participate/sit up EOB to work on pulmonary function/endurance. Following commands: Intact      Cueing Cueing Techniques: Verbal cues  Exercises      General Comments General comments (skin integrity, edema, etc.): SpO2 88% on 5L at start of session with pt resting supine, down to low 80s% with mobility on 5L, up to 89% on 7L resting supine end of session; notified RN of sats, pt reports of frequent diarrhea, and pt report of feeling clogged up in the nostrils which is impacting him from breathing in the supplemental O2; educated pt on AROM of legs as able/tolerated when supine and not with PT and on use of IS 10x/hour      Pertinent Vitals/Pain Pain Assessment Pain Assessment: Faces Faces Pain Scale: Hurts little more Pain Location: stomach, buttocks with diarrhea Pain Descriptors / Indicators: Discomfort, Grimacing, Sore Pain Intervention(s): Limited activity within patient's tolerance, Monitored during session, Repositioned    Home Living                          Prior Function            PT Goals (current goals can now be found in the care plan section) Acute Rehab PT Goals Patient Stated Goal: to go home PT Goal Formulation: With patient Time For Goal Achievement: 09/19/23 Potential to Achieve Goals: Fair Progress towards PT goals: Progressing toward goals    Frequency    Min 2X/week      PT Plan      Co-evaluation              AM-PAC PT 6 Clicks Mobility   Outcome Measure  Help needed turning from your back to your side while in a flat bed  without using bedrails?: A Little Help needed moving from lying on your back to sitting on the side of a flat bed without using bedrails?: A Little Help needed moving to and from a bed to a chair (including a wheelchair)?: A Lot Help needed standing up from a chair using your arms (e.g., wheelchair or bedside chair)?: A Lot Help needed to walk in hospital room?: Total Help needed climbing 3-5 steps with a railing? : Total 6 Click Score: 12    End of Session   Activity Tolerance: Patient limited by pain;Treatment limited secondary to medical complications (Comment);Other (comment) (SpO2 dropping; frequent diarrhea) Patient left: in bed;with call bell/phone within reach;with bed alarm set Nurse Communication: Mobility status;Other (comment) (sats, pt reporting diarrhea and clogged  nostrils) PT Visit Diagnosis: Muscle weakness (generalized) (M62.81);Other abnormalities of gait and mobility (R26.89);Unsteadiness on feet (R26.81);Difficulty in walking, not elsewhere classified (R26.2)     Time: 8896-8861 PT Time Calculation (min) (ACUTE ONLY): 35 min  Charges:    $Therapeutic Exercise: 8-22 mins $Therapeutic Activity: 8-22 mins PT General Charges $$ ACUTE PT VISIT: 1 Visit                     Theo Ferretti, PT, DPT Acute Rehabilitation Services  Office: [phone]    Theo CHRISTELLA Ferretti 09/13/2023, 12:54 PM

## 2023-09-13 NOTE — Progress Notes (Addendum)
 Progress Note   Patient: [name] FMW:[id number] DOB: Jan 07, 1961 DOA: 08/27/2023  DOS: the patient was seen and examined on 09/13/2023   Brief hospital course:  63yo male smoker with hx HTN, COPD, PAF on eliquis , NICM, HFpEF presented 7/14 with 1 week progressive SOB, cough, malaise, poor appetite. On EMS arrival with hypoxic with sats 80% on RA. In ER initially BP was soft with SBP 80's, tachy 130's but responded well to 1L fluids. CXR revealed dense RUL PNA. He was admitted by [facility], started on IV rocephin /azithro. On 7/15 he was still boarding in ER and again having hypotension with SBP 80's and PCCM was consulted.    Assessment and Plan:   Septic shock - Initially requiring vasopressor medications to maintain MAP greater than 65.  Blood cultures, IV fluid bolus, empiric antibiotics on board.  Vasopressor medications weaned off.  Blood pressure stable.  Transferred from PCCM to [facility] 7/30.  Continues on linezolid  plus meropenem  (to complete 7 days).   Acute hypoxic respiratory failure - Continues on 4-5 L nasal cannula.  Able to be weaned down from heated high flow.  Will continue to wean O2 as tolerated.   COPD exacerbation - Possible contributing etiology to hypoxia.  Continue nebulizers.   Acute HFpEF - Likely contributing etiology to patient's hypoxia.  Responding well to IV diuresis.  Given mild hypotension will hold IV Lasix  for today.  Continue to monitor urine output.  Supplemental O2 as above.   Dysphagia - History of achalasia in the past.  NG tube in place.  Modified barium showing retention in the esophagus.  No overt vomiting at this time.  Continues on clear liquid diet.  Awaiting improvement in hypoxia and hypotension prior to consulting GI.    Acute urinary retention - In&Out catheter 7/29 secondary to retention.  Initiate Flomax  0.4 mg daily.  Monitor closely for retention.  May need repeat I/O or Foley.   Paroxysmal atrial fibrillation - Continue amiodarone ,  Eliquis .  Telemetry on board.  Moderate protein calorie malnutrition - Dietary on board.  Currently has NG tube in place receiving tube feeds.  Physical debilitation muscle weakness - PT/OT following.  Given patient's hospitalization, likely that he will need rehab once his hypoxia and illness improved.     Subjective: Patient resting comfortably this morning.  States he continues to feel weak.  Lasix  held overnight secondary to mild hypotension.  Family at bedside, appreciated update since being transferred out of the ICU.  Patient himself currently denies any fevers, purulent sputum, nausea, vomiting, abdominal pain.  Does admit still that he feels liquids are stuck in his throat when he drinks.  Physical Exam:  Vitals:   09/13/23 0331 09/13/23 0401 09/13/23 0724 09/13/23 0829  BP: (!) 89/63  94/69   Pulse:   (!) 109 (!) 107  Resp: 18  18 18   Temp:   98.7 F (37.1 C)   TempSrc:   Oral   SpO2: 94%  93% 92%  Weight:  83.8 kg    Height:        GENERAL:  Alert, pleasant, no acute distress, ill-appearing HEENT:  EOMI, NG tube, nasal cannula CARDIOVASCULAR:  RRR, no murmurs appreciated RESPIRATORY: Poor air movement bilaterally, mild expiratory wheezing appreciated GASTROINTESTINAL:  Soft, nontender, nondistended EXTREMITIES: Mild BL LE pitting edema NEURO:  No new focal deficits appreciated SKIN:  No rashes noted PSYCH:  Appropriate mood and affect    Data Reviewed:  Imaging Studies: DG Swallowing Func-Speech Pathology Result Date: 09/11/2023 Table  formatting from the original result was not included. Images from the original result were not included. Modified Barium Swallow Study Patient Details Name: [name] MRN: [id number] Date of Birth: 1961-02-07 Today's Date: 09/11/2023 HPI/PMH: HPI: [name] is a 63 yo male presenting to ED 7/14 with SOB, cough, malaise, and poor appetite x1 week. CXR showed dense RLL PNA. Complicated by septic shock with pressor needs and  transfer to ICU 7/15. Transferred back to [facility] 7/20 but with worsening hypoxia on [facility] 7/24. CXR 7/24 shows increased R lung opacity, concerning for worsening R PNA. CCM re-consulted and CT Chest pending. Thought to have a primary esophageal dysphagia at the time of previous SLP evaluation 7/15 and had been scheduled for an OP esophageal dilation 7/28 PTA. PMH includes former EtOH abuse, tobacco abuse, paroxysmal A-fib, chronic HFpEF, nonischemic cardiomyopathy, HTN, HLD, COPD, esophageal dysphagia with history of stricture and dysmotility s/p multiple dilations Clinical Impression: Clinical Impression: Patient presents with an oropharyngeal swallow that is [facility] as per this MBS. Swallow initiated at level of vallecular sinus for liquids and solids. Mild amount of vallecular residuals remained s/p initial swallows with solids and liquids but cleared with subsequent swallows. Two instances of penetration, one flash (PAS 2) and one where penetrate did not immediately clear from laryngeal vestibule (PAS 3) observed with thin liquids. No instances of aspiration occured with any of the tested barium consistencies. PES opening appeared [facility]. During esophageal sweep, appearance of barium stasis observed throughout majority of esophagus with retrograde flow below PES. SLP informed MD of findings and will s/o at this time as patient's dysphagia is primarily esophageal in nature. Factors that may increase risk of adverse event in presence of aspiration Noe & Lianne 2021): Factors that may increase risk of adverse event in presence of aspiration Noe & Lianne 2021): Frail or deconditioned; Presence of tubes (ETT, trach, NG, etc.); Poor general health and/or compromised immunity Recommendations/Plan: Swallowing Evaluation Recommendations Swallowing Evaluation Recommendations Recommendations: PO diet PO Diet Recommendation: -- (thin liquids, and any type of solids; recs per GI) Liquid Administration via: Cup; Straw  Postural changes: Position pt fully upright for meals; Stay upright 30-60 min after meals Oral care recommendations: Oral care BID (2x/day) Treatment Plan Treatment Plan Treatment recommendations: No treatment recommended at this time Follow-up recommendations: No SLP follow up Functional status assessment: Patient has not had a recent decline in their functional status. Recommendations Recommendations for follow up therapy are one component of a multi-disciplinary discharge planning process, led by the attending physician.  Recommendations may be updated based on patient status, additional functional criteria and insurance authorization. Assessment: Orofacial Exam: Orofacial Exam Oral Cavity: Oral Hygiene: WFL Orofacial Anatomy: WFL Oral Motor/Sensory Function: WFL Anatomy: Anatomy: Prominent cricopharyngeus Boluses Administered: Boluses Administered Boluses Administered: Thin liquids (Level 0); Mildly thick liquids (Level 2, nectar thick); Moderately thick liquids (Level 3, honey thick); Puree; Solid  Oral Impairment Domain: Oral Impairment Domain Lip Closure: No labial escape Tongue control during bolus hold: Not tested Bolus preparation/mastication: Timely and efficient chewing and mashing Bolus transport/lingual motion: Brisk tongue motion Oral residue: Complete oral clearance Location of oral residue : N/A Initiation of pharyngeal swallow : Valleculae  Pharyngeal Impairment Domain: Pharyngeal Impairment Domain Soft palate elevation: No bolus between soft palate (SP)/pharyngeal wall (PW) Laryngeal elevation: Complete superior movement of thyroid cartilage with complete approximation of arytenoids to epiglottic petiole Anterior hyoid excursion: Partial anterior movement Epiglottic movement: Complete inversion Laryngeal vestibule closure: Complete, no air/contrast in laryngeal vestibule Pharyngeal stripping wave :  Present - complete Pharyngeal contraction (A/P view only): N/A Pharyngoesophageal segment opening:  Complete distension and complete duration, no obstruction of flow Tongue base retraction: No contrast between tongue base and posterior pharyngeal wall (PPW) Pharyngeal residue: Collection of residue within or on pharyngeal structures Location of pharyngeal residue: Valleculae  Esophageal Impairment Domain: Esophageal Impairment Domain Esophageal clearance upright position: Esophageal retention with retrograde flow below pharyngoesophageal segment (PES); Esophageal retention Pill: Pill Consistency administered: Thin liquids (Level 0) Thin liquids (Level 0): [facility] Penetration/Aspiration Scale Score: Penetration/Aspiration Scale Score 1.  Material does not enter airway: Mildly thick liquids (Level 2, nectar thick); Moderately thick liquids (Level 3, honey thick); Puree; Solid; Pill 2.  Material enters airway, remains ABOVE vocal cords then ejected out: Thin liquids (Level 0) Compensatory Strategies: Compensatory Strategies Compensatory strategies: Yes Straw: Effective Effective Straw: Thin liquid (Level 0)   General Information: No data recorded Diet Prior to this Study: NPO   Temperature : Normal   No data recorded  Supplemental O2: Nasal cannula   History of Recent Intubation: No  Behavior/Cognition: Alert; Cooperative; Pleasant mood Self-Feeding Abilities: Needs assist with self-feeding Baseline vocal quality/speech: Normal Volitional Cough: Able to elicit Volitional Swallow: Able to elicit Exam Limitations: No limitations Goal Planning: Prognosis for improved oropharyngeal function: Good No data recorded No data recorded Patient/Family Stated Goal: none stated Consulted and agree with results and recommendations: Patient Pain: Pain Assessment Pain Assessment: No/denies pain Pain Score: 0 Pain Location: back and abdomen Pain Descriptors / Indicators: Discomfort; Sore Pain Intervention(s): Monitored during session; Repositioned; Patient requesting pain meds-RN notified End of Session: Start Time:SLP Start Time (ACUTE  ONLY): 1545 Stop Time: SLP Stop Time (ACUTE ONLY): 1405 Time Calculation:SLP Time Calculation (min) (ACUTE ONLY): 1340 min Charges: SLP Evaluations $ SLP Speech Visit: 1 Visit SLP Evaluations $MBS Swallow: 1 Procedure SLP visit diagnosis: SLP Visit Diagnosis: Dysphagia, pharyngoesophageal phase (R13.14) Past Medical History: Past Medical History: Diagnosis Date  Anxiety   Arthritis   Cardiomyopathy (HCC)   CHF (congestive heart failure) (HCC)   COPD (chronic obstructive pulmonary disease) (HCC)   Dysphagia   with esophageal stricture  Dysrhythmia   atrial fib  GERD (gastroesophageal reflux disease)   History of blood transfusion   Hypertension  Past Surgical History: Past Surgical History: Procedure Laterality Date  ACETABULAR REVISION Right 05/15/2012  Procedure: ACETABULAR REVISION;  Surgeon: Dempsey LULLA Moan, MD;  Location: [facility] ORS;  Service: Orthopedics;  Laterality: Right;  RIGHT HIP BEARING SURFACE VS ACETABULAR REVISION   ESOPHAGOGASTRODUODENOSCOPY    with dilitation  EYE SURGERY Right   cataract extraction with IOL  FLEXIBLE BRONCHOSCOPY Bilateral 09/07/2023  Procedure: BRONCHOSCOPY, FLEXIBLE;  Surgeon: Kara Dorn NOVAK, MD;  Location: [facility] ENDOSCOPY;  Service: Pulmonary;  Laterality: Bilateral;  JOINT REPLACEMENT Bilateral   hips/revision right x 4 Norleen IVAR Blase, MA, CCC-SLP Speech Therapy   DG Chest Port 1 View Result Date: 09/09/2023 CLINICAL DATA:  Hypoxia EXAM: PORTABLE CHEST 1 VIEW COMPARISON:  09/08/2023 FINDINGS: Diffuse airspace disease throughout the right lung. Left lower lobe airspace opacities also noted. Findings similar to prior study. Heart and mediastinal contours within normal limits. Aortic atherosclerosis. No effusions or pneumothorax. Right apical pleural thickening, stable. IMPRESSION: Diffuse right lung airspace disease with right apical pleural thickening, stable. Stable left lower lobe airspace disease. Electronically Signed   By: Franky Crease M.D.   On: 09/09/2023 21:37   US   Abdomen Limited RUQ (LIVER/GB) Result Date: 09/08/2023 CLINICAL DATA:  Elevated liver enzymes. EXAM: ULTRASOUND ABDOMEN LIMITED RIGHT  UPPER QUADRANT COMPARISON:  Last study which covers the area was CT chest without contrast 08/27/2023. FINDINGS: Gallbladder: The gallbladder is dilated up 12 cm in length. There is no wall thickening, stones, pericholecystic fluid or positive sonographic Murphy sign. Common bile duct: Diameter: Prominent measuring 8.7 mm. There is at least mild intrahepatic bile duct dilatation as well. This was not seen on July 14. Liver: No focal lesion identified. Parenchymal echogenicity is increased consistent with at least mild steatosis. Portal vein is patent on color Doppler imaging with normal direction of blood flow towards the liver. Other: Small layering right pleural effusion. IMPRESSION: 1. Dilated gallbladder with no wall thickening, stones, pericholecystic fluid or positive sonographic Murphy sign. 2. Prominent common bile duct measuring 8.7 mm with at least mild intrahepatic bile duct dilatation as well. This was not seen on July 14. Recommend MRCP or ERCP. 3. At least mild hepatic steatosis. 4. Small layering right pleural effusion. Electronically Signed   By: Francis Quam M.D.   On: 09/08/2023 06:13   DG CHEST PORT 1 VIEW Result Date: 09/08/2023 CLINICAL DATA:  [id number]. Respiratory failure. Extensive right-sided pneumonia. EXAM: PORTABLE CHEST 1 VIEW COMPARISON:  Chest CT and portable chest both 09/06/2023. FINDINGS: Extensive airspace disease in the right lung continues to be seen with small layering pleural effusion and apparently a small loculated effusion in the right apex. There is increased small to moderate size left pleural effusion. The left lung is otherwise clear. There is a normal heart size with stable cardiomediastinal silhouette. Aortic atherosclerosis. No new osseous abnormality.  Degenerative change thoracic spine. Feeding tube extends well into the stomach  towards the right new from the prior studies, but the radiopaque tip is out of view. IMPRESSION: 1. Extensive airspace disease in the right lung continues to be seen with small layering pleural effusion and apparently a small loculated effusion in the right apex. 2. Increased small to moderate size left pleural effusion. Left chest otherwise clear. 3. Feeding tube extends well into the stomach towards the right new from the prior studies, but the radiopaque tip is out of view. 4. Aortic atherosclerosis. Electronically Signed   By: Francis Quam M.D.   On: 09/08/2023 06:07   DG Abd Portable 1V Result Date: 09/07/2023 CLINICAL DATA:  [id number] Encounter for feeding tube placement [id number] EXAM: PORTABLE ABDOMEN - 1 VIEW COMPARISON:  None Available. FINDINGS: Weighted feeding tube terminates in the region of the gastric antrum/duodenal bulb. Nonobstructive bowel gas pattern. Surgical staple line in the right lower quadrant and left pelvis. No pneumoperitoneum. No organomegaly or radiopaque calculi. Multilevel degenerative disc disease of the spine. Bilateral hip arthroplasties, partially visualized. IMPRESSION: Weighted feeding tube terminates in the region of the gastric antrum/duodenal bulb. Continued advancement recommended for postpyloric positioning. Electronically Signed   By: Rogelia Myers M.D.   On: 09/07/2023 15:54   CT CHEST WO CONTRAST Result Date: 09/06/2023 CLINICAL DATA:  Pneumonia EXAM: CT CHEST WITHOUT CONTRAST TECHNIQUE: Multidetector CT imaging of the chest was performed following the standard protocol without IV contrast. RADIATION DOSE REDUCTION: This exam was performed according to the departmental dose-optimization program which includes automated exposure control, adjustment of the mA and/or kV according to patient size and/or use of iterative reconstruction technique. COMPARISON:  Chest x-ray 09/06/2023, CT chest 08/27/2023, 08/29/2021, 09/07/2022 FINDINGS: Cardiovascular: Limited evaluation  without intravenous contrast. Moderate aortic atherosclerosis. No aneurysm. Coronary vascular calcification. Normal cardiac size. Trace pericardial effusion. Mediastinum/Nodes: Patent trachea. No suspicious thyroid mass. Mild mediastinal lymph nodes as before.  Low right paratracheal node measuring 12 mm, previously 12 mm. Esophagus shows small amount of debris within the mid lumen. Small hiatal hernia and mild circumferential distal esophageal thickening Lungs/Pleura: Emphysema. Extensive airspace disease and consolidation in the right upper lobe with slightly progressive foci of cavitation in the right upper lobe consolidation. Slightly distorted appearance of consolidation on series 4, image 38. Heterogeneous consolidation and density in the right middle lobe. Progressive heterogeneous consolidation and airspace disease in the right lower lobe. New small right greater than left pleural effusions. Upper Abdomen: No acute finding in the upper abdomen Musculoskeletal: No acute osseous abnormality IMPRESSION: 1. Extensive airspace disease and consolidation in the right upper lobe with slightly progressive foci of cavitation in the right upper lobe consolidation suggestive of necrosis. Slightly distorted appearance of right upper lobe consolidation, underlying right upper lobe lung mass is not excluded and imaging follow-up to resolution is recommended. Significant progression of airspace disease in the right lower lobe, suspicious for pneumonia. 2. New small right greater than left pleural effusions. 3. Aortic atherosclerosis. Aortic Atherosclerosis (ICD10-I70.0) and Emphysema (ICD10-J43.9). Electronically Signed   By: Luke Bun M.D.   On: 09/06/2023 23:23   DG CHEST PORT 1 VIEW Result Date: 09/06/2023 CLINICAL DATA:  Pneumonia. EXAM: PORTABLE CHEST 1 VIEW COMPARISON:  August 30, 2023.  August 27, 2023. FINDINGS: The heart size and mediastinal contours are within normal limits. Left lung is clear. Diffuse airspace  and interstitial opacities are noted throughout the lung which are increased compared to prior exam most consistent with pneumonia. Small right pleural effusion may be present. The visualized skeletal structures are unremarkable. IMPRESSION: Increased right lung opacity is noted diffusely concerning for worsening right-sided pneumonia. Small right pleural effusion may be present. Electronically Signed   By: Lynwood Landy Raddle M.D.   On: 09/06/2023 13:15   VAS US  LOWER EXTREMITY VENOUS (DVT) Result Date: 08/30/2023  Lower Venous DVT Study Patient Name:  BRENON ANTOSH  Date of Exam:   08/30/2023 Medical Rec #: [id number]         Accession #:    [id number] Date of Birth: 1960-12-18          Patient Gender: M Patient Age:   80 years Exam Location:  [facility] Procedure:      VAS US  LOWER EXTREMITY VENOUS (DVT) Referring Phys: TORIBIO SHARPS --------------------------------------------------------------------------------  Indications: Edema.  Comparison Study: No previous exams Performing Technologist: Jody Hill RVT, RDMS  Examination Guidelines: A complete evaluation includes B-mode imaging, spectral Doppler, color Doppler, and power Doppler as needed of all accessible portions of each vessel. Bilateral testing is considered an integral part of a complete examination. Limited examinations for reoccurring indications may be performed as noted. The reflux portion of the exam is performed with the patient in reverse Trendelenburg.  +---------+---------------+---------+-----------+----------+--------------+ RIGHT    CompressibilityPhasicitySpontaneityPropertiesThrombus Aging +---------+---------------+---------+-----------+----------+--------------+ CFV      Full           Yes      Yes                                 +---------+---------------+---------+-----------+----------+--------------+ SFJ      Full                                                         +---------+---------------+---------+-----------+----------+--------------+  FV Prox  Full           Yes      Yes                                 +---------+---------------+---------+-----------+----------+--------------+ FV Mid   Full           Yes      Yes                                 +---------+---------------+---------+-----------+----------+--------------+ FV DistalFull           Yes      Yes                                 +---------+---------------+---------+-----------+----------+--------------+ PFV      Full                                                        +---------+---------------+---------+-----------+----------+--------------+ POP      Full           Yes      Yes                                 +---------+---------------+---------+-----------+----------+--------------+ PTV      Full                                                        +---------+---------------+---------+-----------+----------+--------------+ PERO     Full                                                        +---------+---------------+---------+-----------+----------+--------------+   +---------+---------------+---------+-----------+----------+--------------+ LEFT     CompressibilityPhasicitySpontaneityPropertiesThrombus Aging +---------+---------------+---------+-----------+----------+--------------+ CFV      Full           Yes      Yes                                 +---------+---------------+---------+-----------+----------+--------------+ SFJ      Full                                                        +---------+---------------+---------+-----------+----------+--------------+ FV Prox  Full           Yes      Yes                                 +---------+---------------+---------+-----------+----------+--------------+ FV Mid   Full  Yes      Yes                                  +---------+---------------+---------+-----------+----------+--------------+ FV DistalFull           Yes      Yes                                 +---------+---------------+---------+-----------+----------+--------------+ PFV      Full                                                        +---------+---------------+---------+-----------+----------+--------------+ POP      Full           Yes      Yes                                 +---------+---------------+---------+-----------+----------+--------------+ PTV      Full                                                        +---------+---------------+---------+-----------+----------+--------------+ PERO     Full                                                        +---------+---------------+---------+-----------+----------+--------------+     Summary: BILATERAL: - No evidence of deep vein thrombosis seen in the lower extremities, bilaterally. -No evidence of popliteal cyst, bilaterally.   *See table(s) above for measurements and observations. Electronically signed by Debby Robertson on 08/30/2023 at 5:04:31 PM.    Final    DG Chest Port 1 View Result Date: 08/30/2023 CLINICAL DATA:  Pneumonia EXAM: PORTABLE CHEST 1 VIEW COMPARISON:  08/29/2023 FINDINGS: Stable consolidation of the right upper lobe. Increased right perihilar and infrahilar airspace opacity suspicious for multilobar pneumonia on the right. Interstitial accentuation noted at the right lung base, mildly worsened. Atherosclerotic calcification of the aortic arch. IMPRESSION: 1. Stable consolidation of the right upper lobe. 2. Increased right perihilar and infrahilar airspace opacity suspicious for multilobar pneumonia. 3. Interstitial accentuation at the right lung base, mildly worsened. 4. Aortic Atherosclerosis (ICD10-I70.0). Electronically Signed   By: Ryan Salvage M.D.   On: 08/30/2023 09:04   DG CHEST PORT 1 VIEW Result Date: 08/30/2023 CLINICAL DATA:   Pneumonia, shortness of breath EXAM: PORTABLE CHEST 1 VIEW COMPARISON:  None Available. FINDINGS: 08/30/2023 at 6:12 a.m. persistent dense consolidation of most of the right upper lobe. Hazy right perihilar and infrahilar airspace opacity along with stable indistinct interstitial opacities throughout the remainder of the right lung. The left lung appears clear. Atherosclerotic calcification of the aortic arch. Heart size within normal limits. IMPRESSION: 1. Persistent dense consolidation of most of the right upper lobe, with hazy right perihilar and infrahilar airspace opacity along with indistinct interstitial opacities throughout  the remainder of the right lung. Followup chest imaging is recommended in 3-4 weeks following presumed antibiotic therapy to ensure resolution and exclude underlying malignancy. 2. Aortic Atherosclerosis (ICD10-I70.0). Electronically Signed   By: Ryan Salvage M.D.   On: 08/30/2023 09:03   DG Chest Port 1 View Result Date: 08/29/2023 CLINICAL DATA:  Follow-up pneumonia EXAM: PORTABLE CHEST 1 VIEW COMPARISON:  08/27/23 FINDINGS: Stable cardiomediastinal contours. Aortic atherosclerotic calcifications. No significant pleural effusion or signs of pneumothorax. Airspace consolidation is identified within the right upper lobe, persistent right upper lobe airspace consolidation appears unchanged from the previous exam. Mild increased interstitial markings in the right lower lobe also unchanged. Left lung remains clear. IMPRESSION: 1. No change in right upper lobe airspace consolidation compatible with pneumonia. 2. Stable increased interstitial markings in the right lower lobe. Electronically Signed   By: Waddell Calk M.D.   On: 08/29/2023 05:51   ECHOCARDIOGRAM COMPLETE Result Date: 08/28/2023    ECHOCARDIOGRAM REPORT   Patient Name:   Taeshawn Helfman Date of Exam: 08/28/2023 Medical Rec #:  [id number]        Height:       69.0 in Accession #:    [id number]       Weight:       190.0  lb Date of Birth:  06/18/1960         BSA:          2.021 m Patient Age:    63 years         BP:           85/58 mmHg Patient Gender: M                HR:           111 bpm. Exam Location:  Inpatient Procedure: 2D Echo, Cardiac Doppler and Color Doppler (Both Spectral and Color            Flow Doppler were utilized during procedure). Indications:    Elevated Troponin  History:        Patient has no prior history of Echocardiogram examinations. CHF                 and Cardiomyopathy, CAD, COPD, Arrythmias:Atrial Fibrillation,                 Signs/Symptoms:Dyspnea; Risk Factors:Current Smoker and                 Dyslipidemia.  Sonographer:    Thea Norlander RCS Referring Phys: TERRY LOISE HURST  Sonographer Comments: Image acquisition challenging due to patient behavioral factors., Image acquisition challenging due to patient body habitus and Image acquisition challenging due to uncooperative patient. IMPRESSIONS  1. Left ventricular ejection fraction, by estimation, is 55 to 60%. The left ventricle has normal function. The left ventricle has no regional wall motion abnormalities.  2. Right ventricular systolic function is normal. The right ventricular size is normal. Tricuspid regurgitation signal is inadequate for assessing PA pressure.  3. Left atrial size was moderately dilated.  4. The mitral valve is normal in structure. No evidence of mitral valve regurgitation. No evidence of mitral stenosis.  5. The aortic valve is normal in structure. Aortic valve regurgitation is trivial. Comparison(s): Only apical views were obtained. FINDINGS  Left Ventricle: Left ventricular ejection fraction, by estimation, is 55 to 60%. The left ventricle has normal function. The left ventricle has no regional wall motion abnormalities. The left ventricular internal cavity size was normal in size.  There is  no left ventricular hypertrophy. Left ventricular diastolic function could not be evaluated due to atrial fibrillation. Right  Ventricle: The right ventricular size is normal. No increase in right ventricular wall thickness. Right ventricular systolic function is normal. Tricuspid regurgitation signal is inadequate for assessing PA pressure. Left Atrium: Left atrial size was moderately dilated. Right Atrium: Right atrial size was normal in size. Pericardium: There is no evidence of pericardial effusion. Mitral Valve: The mitral valve is normal in structure. No evidence of mitral valve regurgitation. No evidence of mitral valve stenosis. Tricuspid Valve: The tricuspid valve is normal in structure. Tricuspid valve regurgitation is mild. Aortic Valve: The aortic valve is normal in structure. Aortic valve regurgitation is trivial. Aortic valve peak gradient measures 5.6 mmHg. Pulmonic Valve: The pulmonic valve was not well visualized. Aorta: The aortic root was not well visualized. Venous: The inferior vena cava was not well visualized. IAS/Shunts: No atrial level shunt detected by color flow Doppler.   Diastology LV e' medial:    11.40 cm/s LV E/e' medial:  6.4 LV e' lateral:   15.70 cm/s LV E/e' lateral: 4.7  RIGHT VENTRICLE RV S prime:     18.60 cm/s TAPSE (M-mode): 1.2 cm LEFT ATRIUM           Index        RIGHT ATRIUM           Index LA Vol (A4C): 56.6 ml 28.00 ml/m  RA Area:     16.30 cm                                    RA Volume:   41.90 ml  20.73 ml/m  AORTIC VALVE AV Vmax:      118.00 cm/s AV Peak Grad: 5.6 mmHg LVOT Vmax:    121.00 cm/s LVOT Vmean:   76.500 cm/s LVOT VTI:     0.165 m MITRAL VALVE               TRICUSPID VALVE MV Area (PHT): 3.62 cm    TR Peak grad:   19.4 mmHg MV Decel Time: 210 msec    TR Vmax:        220.00 cm/s MV E velocity: 73.40 cm/s MV A velocity: 52.10 cm/s  SHUNTS MV E/A ratio:  1.41        Systemic VTI: 0.16 m Jerel Croitoru MD Electronically signed by Jerel Balding MD Signature Date/Time: 08/28/2023/3:58:50 PM    Final    CT CHEST WO CONTRAST Result Date: 08/27/2023 CLINICAL DATA:  Increased right  apical airspace opacity EXAM: CT CHEST WITHOUT CONTRAST TECHNIQUE: Multidetector CT imaging of the chest was performed following the standard protocol without IV contrast. RADIATION DOSE REDUCTION: This exam was performed according to the departmental dose-optimization program which includes automated exposure control, adjustment of the mA and/or kV according to patient size and/or use of iterative reconstruction technique. COMPARISON:  Chest x-ray from earlier in the same day. FINDINGS: Cardiovascular: Atherosclerotic calcifications of the thoracic aorta are noted. Absence of contrast material somewhat limits the vascular evaluation. Heart is not significantly enlarged in size. Heavy coronary calcifications are noted. Mediastinum/Nodes: Thoracic inlet is within normal limits. No hilar or mediastinal adenopathy is noted. At least two tablets are noted within the mid to distal esophagus. Lungs/Pleura: Significant emphysematous changes are noted. Left lung is well aerated. Right lung is well aerated but demonstrate significant airspace consolidation.  Patchy airspace opacity is noted in the right middle and right lower lobes medially. These changes are felt to represent extensive pneumonia. The possibility of a neoplasm however cannot be totally excluded. No sizable effusion is seen. Upper Abdomen: No acute abnormality. Musculoskeletal: No chest wall mass or suspicious bone lesions identified. IMPRESSION: Extensive airspace consolidation in the right upper lobe with lesser changes in the right middle and lower lobes consistent with multifocal infiltrate. Short-term follow-up following appropriate therapy is recommended to assess for resolution. Aortic Atherosclerosis (ICD10-I70.0) and Emphysema (ICD10-J43.9). Electronically Signed   By: Oneil Devonshire M.D.   On: 08/27/2023 23:07   DG Chest Port 1 View Result Date: 08/27/2023 CLINICAL DATA:  Shortness of breath for 2-3 days. History of congestive heart failure and  COPD. EXAM: PORTABLE CHEST 1 VIEW COMPARISON:  Radiographs 04/04/2018 and 11/11/2015. Chest CT 09/07/2022. FINDINGS: 1524 hours. Two AP semi erect views of the chest are submitted. There is new dense right upper lobe airspace disease with associated volume loss. The left lung appears clear. No evidence of pleural effusion or pneumothorax. The visualized heart size and mediastinal contours are stable with aortic atherosclerosis. No acute osseous findings are evident. Telemetry leads overlie the chest. IMPRESSION: New dense right upper lobe airspace disease with associated volume loss, suspicious for pneumonia. Differential includes reactivation tuberculosis and malignancy. Correlate clinically. Followup PA and lateral chest X-ray is recommended in 4-6 weeks following appropriate therapy to ensure resolution and exclude underlying malignancy. Electronically Signed   By: Elsie Perone M.D.   On: 08/27/2023 15:37    There are no new results to review at this time.  Previous records (including but not limited to H&P, progress notes, nursing notes, TOC management) were reviewed in assessment of this patient.  Labs: CBC: Recent Labs  Lab 09/06/23 1240 09/07/23 1317 09/08/23 0423 09/09/23 0442 09/10/23 0351 09/13/23 0024 09/13/23 0342  WBC 20.2* 16.1* 15.5* 18.2* 14.3* 10.5 9.3  NEUTROABS 17.3* 14.1*  --  16.1* 11.9*  --   --   HGB 10.9* 10.8* 10.2* 9.2* 9.2* 9.4* 9.6*  HCT 34.7* 34.9* 31.6* 28.9* 29.0* 29.3* 29.9*  MCV 85.5 85.5 84.3 83.3 84.3 82.5 82.4  PLT 294 234 246 217 219 184 180   Basic Metabolic Panel: Recent Labs  Lab 09/07/23 1446 09/08/23 0423 09/08/23 1205 09/09/23 0442 09/09/23 0635 09/10/23 0351 09/10/23 1311 09/11/23 0327 09/13/23 0024 09/13/23 0342  NA  --   --    < > 134*   < > 133* 136 137 137 136  K  --   --    < > 5.5*   < > 5.8* 5.8* 5.4* 3.8 4.0  CL  --   --    < > 98   < > 97* 98 98 104 100  CO2  --   --    < > 28   < > 29 31 33* 28 30  GLUCOSE  --   --     < > 113*   < > 108* 100* 104* 125* 108*  BUN  --   --    < > 32*   < > 37* 36* 30* 22 22  CREATININE  --   --    < > 1.10   < > 1.20 1.16 0.97 0.87 0.82  CALCIUM   --   --    < > 8.3*   < > 8.3* 8.5* 8.4* 7.8* 7.8*  MG 1.9 2.3  --  2.2  --  2.3  --   --   --  1.8  PHOS 3.1 2.9  --  2.7  --  3.1  --   --   --   --    < > = values in this interval not displayed.   Liver Function Tests: Recent Labs  Lab 09/08/23 1205 09/09/23 0442 09/10/23 0351 09/13/23 0342  AST 25 38 47* 41  ALT 42 50* 62* 50*  ALKPHOS 74 77 78 83  BILITOT 0.4 0.2 0.2 0.8  PROT 5.4* 5.7* 5.5* 4.9*  ALBUMIN  2.9* 2.9* 2.8* 2.1*   CBG: Recent Labs  Lab 09/12/23 1924 09/13/23 0104 09/13/23 0333 09/13/23 0725 09/13/23 1144  GLUCAP 99 153* 116* 116* 88    Scheduled Meds:  amiodarone   200 mg Per Tube Daily   apixaban   5 mg Per Tube BID   arformoterol   15 mcg Nebulization BID   atorvastatin   40 mg Per Tube Daily   bethanechol   10 mg Oral TID   Chlorhexidine  Gluconate Cloth  6 each Topical Daily   doxazosin   1 mg Per Tube Daily   feeding supplement (PROSource TF20)  60 mL Per Tube Daily   insulin  aspart  0-9 Units Subcutaneous Q4H   multivitamin with minerals  1 tablet Per Tube Daily   mouth rinse  15 mL Mouth Rinse 4 times per day   pantoprazole  (PROTONIX ) IV  40 mg Intravenous Q12H   revefenacin   175 mcg Nebulization Daily   sodium chloride  flush  10-40 mL Intracatheter Q12H   sodium zirconium cyclosilicate   10 g Per Tube TID   thiamine   100 mg Per Tube Daily   Continuous Infusions:  feeding supplement (OSMOLITE 1.5 CAL) 55 mL/hr at 09/11/23 2300   PRN Meds:.diazepam , fluticasone , guaiFENesin -dextromethorphan , hydrOXYzine , ipratropium-albuterol , lip balm, melatonin, mouth rinse, oxyCODONE -acetaminophen , phenol, polyethylene glycol, prochlorperazine , sodium chloride   Family Communication: Family at bedside  Disposition: Status is: Inpatient Remains inpatient appropriate because: Respiratory  failure     Time spent: 50 minutes  Length of inpatient stay: 17 days  Author: Carliss LELON Canales, DO 09/13/2023 11:50 AM  For on call review [email or web address].

## 2023-09-13 NOTE — Plan of Care (Signed)

## 2023-09-13 NOTE — Progress Notes (Signed)
   NAME:  [name], MRN:  [id number], DOB:  [birth year], LOS: 17 ADMISSION DATE:  08/27/2023, CONSULTATION DATE:  7/15 REFERRING MD:  TRH, CHIEF COMPLAINT:  hypotension    History of Present Illness:  63yo male smoker with hx HTN, COPD, PAF on eliquis , NICM, HFpEF presented 7/14 with 1 week progressive SOB, cough, malaise, poor appetite. On EMS arrival with hypoxic with sats 80% on RA. In ER initially BP was soft with SBP 80's, tachy 130's but responded well to 1L fluids. CXR revealed dense RUL PNA. He was admitted by [facility], started on IV rocephin /azithro. On 7/15 he was still boarding in ER and again having hypotension with SBP 80's and PCCM was consulted.  Lactate 1.4 WBC 34  Pertinent  Medical History   has a past medical history of Anxiety, Arthritis, Cardiomyopathy (HCC), CHF (congestive heart failure) (HCC), COPD (chronic obstructive pulmonary disease) (HCC), Dysphagia, Dysrhythmia, GERD (gastroesophageal reflux disease), History of blood transfusion, and Hypertension.  Significant Hospital Events: Including procedures, antibiotic start and stop dates in addition to other pertinent events   CT chest 7/14>> Extensive airspace consolidation in the right upper lobe with lesser changes in the right middle and lower lobes consistent with multifocal infiltrate. Short-term follow-up following appropriate therapy is recommended to assess for resolution. Echo 7/15>>>LVEF 55-60% no WMA, LA dilated 7/17 Worsening resp status this am, feels like he cannot catch breath, anxious as well. Got lasix , HHFO2, dex for restlessness.BLEUS neg for DVT 7/18 breathing better 70% FiO2 on heated high flow adding additional pulmonary hygiene measures 7/24 PCCM re-consulted due to increasing O2 needs and worsening chest x-ray 7/25 bronched. Txf to ICU post case w incr O2. Cortrak. RUQ US    7/26 HHFNC 7/31-still with significant FiO2 requirement  Interim History / Subjective:   Frail, No overnight  events  Objective    Blood pressure 94/69, pulse (!) 107, temperature 98.7 F (37.1 C), temperature source Oral, resp. rate 18, height 5' 9 (1.753 m), weight 83.8 kg, SpO2 92%.        Intake/Output Summary (Last 24 hours) at 09/13/2023 1442 Last data filed at 09/13/2023 1320 Gross per 24 hour  Intake --  Output 2600 ml  Net -2600 ml   Filed Weights   09/11/23 0304 09/12/23 0412 09/13/23 0401  Weight: 88 kg 88 kg 83.8 kg    Examination: General: Chronically ill-appearing frail HEENT: Dry oral mucosa Pulmonary: Bilateral rales Cardiac: S1-S2 appreciated Abdomen: Bowel sounds appreciated Extremities: No clubbing, no edema Neuro: Awake alert interactive, nonfocal  I reviewed last 24 h vitals and pain scores, last 48 h intake and output, last 24 h labs and trends, and last 24 h imaging results.  Resolved problem list  Septic shock Assessment and Plan   Septic shock resolved - Completed 7 days of antibiotics  Hypoxemic respiratory failure - Continue oxygen supplementation  Chronic obstructive pulmonary disease - Continue bronchodilators  Heart failure with preserved ejection fraction Coronary artery disease Paroxysmal atrial fibrillation - On amiodarone , on Eliquis   Stage III chronic kidney disease  Urinary retention  Continue other lines of care

## 2023-09-14 DIAGNOSIS — A419 Sepsis, unspecified organism: Secondary | ICD-10-CM | POA: Diagnosis not present

## 2023-09-14 DIAGNOSIS — E440 Moderate protein-calorie malnutrition: Secondary | ICD-10-CM | POA: Diagnosis not present

## 2023-09-14 DIAGNOSIS — K220 Achalasia of cardia: Secondary | ICD-10-CM | POA: Diagnosis not present

## 2023-09-14 DIAGNOSIS — J9601 Acute respiratory failure with hypoxia: Secondary | ICD-10-CM | POA: Diagnosis not present

## 2023-09-14 LAB — BASIC METABOLIC PANEL WITH GFR
Anion gap: 8 (ref 5–15)
BUN: 24 mg/dL — ABNORMAL HIGH (ref 8–23)
CO2: 26 mmol/L (ref 22–32)
Calcium: 7.9 mg/dL — ABNORMAL LOW (ref 8.9–10.3)
Chloride: 104 mmol/L (ref 98–111)
Creatinine, Ser: 0.75 mg/dL (ref 0.61–1.24)
GFR, Estimated: >60 mL/min (ref >60)
Glucose, Bld: 102 mg/dL — ABNORMAL HIGH (ref 70–99)
Potassium: 4.4 mmol/L (ref 3.5–5.1)
Sodium: 138 mmol/L (ref 135–145)

## 2023-09-14 LAB — CBC
HCT: 29.9 % — ABNORMAL LOW (ref 39.0–52.0)
Hemoglobin: 9.5 g/dL — ABNORMAL LOW (ref 13.0–17.0)
MCH: 26.4 pg (ref 26.0–34.0)
MCHC: 31.8 g/dL (ref 30.0–36.0)
MCV: 83.1 fL (ref 80.0–100.0)
Platelets: 169 K/uL (ref 150–400)
RBC: 3.6 MIL/uL — ABNORMAL LOW (ref 4.22–5.81)
RDW: 20.2 % — ABNORMAL HIGH (ref 11.5–15.5)
WBC: 7.1 K/uL (ref 4.0–10.5)
nRBC: 0 % (ref 0.0–0.2)

## 2023-09-14 LAB — GLUCOSE, CAPILLARY
Glucose-Capillary: 108 mg/dL — ABNORMAL HIGH (ref 70–99)
Glucose-Capillary: 123 mg/dL — ABNORMAL HIGH (ref 70–99)
Glucose-Capillary: 109 mg/dL — ABNORMAL HIGH (ref 70–99)
Glucose-Capillary: 100 mg/dL — ABNORMAL HIGH (ref 70–99)

## 2023-09-14 LAB — MAGNESIUM: Magnesium: 1.9 mg/dL (ref 1.7–2.4)

## 2023-09-14 NOTE — Plan of Care (Signed)

## 2023-09-14 NOTE — Progress Notes (Signed)
 PT Cancellation Note  Patient Details Name: [name] MRN: [id number] DOB: 1960-10-24   Cancelled Treatment:    Reason Eval/Treat Not Completed: (P) Patient declined, no reason specified Pt does not want to participate in PT until his diarrhea is under control. PT will follow back next week.  [name] B. Fleeta Lapidus PT, DPT Acute Rehabilitation Services Please use secure chat or  Call Office [phone]    [name] [facility] 09/14/2023, 11:54 AM

## 2023-09-14 NOTE — Progress Notes (Signed)
 Occupational Therapy Treatment Patient Details Name: [name] MRN: [id number] DOB: 02-12-1961 Today's Date: 09/14/2023   History of present illness Pt is a 63 yr old male who presented 08/27/23 due to SOB, decrease in appetite. Pt the went into septic shock and was transferred into ICU and transferred out on 09/02/23.  Increased O2 requirement on 7/25 and noted chest CT with cavitation of R UL suggestive of necrosis so pt underwent bronchoscopy and transferred back to ICU with HHFNC to 35L 94% FiO2. PMH: HTN, COPD, PAF on eliquis , NICM, HFpEF   OT comments  Patient received in supine and stated he did not want to get up due to diarrhea. Patient willing to participate with encouragement and when explained benefits of increasing mobility. Once on EOB patient needing to use BSC and was able to transfer to [facility] with one person HHA and min assist. Bed linen changed while patient on BSC. Patient was able to stand for toilet hygiene and required max assist before returning to supine. Nursing aware of patient's concerns over diarrhea. Patient will benefit from continued inpatient follow up therapy, <3 hours/day. Acute OT to continue to follow to address established goals to facilitate DC to next venue of care.        If plan is discharge home, recommend the following:  A lot of help with walking and/or transfers;A lot of help with bathing/dressing/bathroom;Assistance with cooking/housework;Assist for transportation;Help with stairs or ramp for entrance   Equipment Recommendations  Other (comment) (defer)    Recommendations for Other Services      Precautions / Restrictions Precautions Precautions: Fall Precaution/Restrictions Comments: watch SpO2 and BP; cortrak Restrictions Weight Bearing Restrictions Per Provider Order: No       Mobility Bed Mobility Overal bed mobility: Needs Assistance Bed Mobility: Supine to Sit, Sit to Supine     Supine to sit: Contact guard, HOB elevated, Used  rails Sit to supine: Contact guard assist, HOB elevated, Used rails   General bed mobility comments: increased time to get to EOB and use of bed rails    Transfers Overall transfer level: Needs assistance Equipment used: 1 person hand held assist Transfers: Sit to/from Stand, Bed to chair/wheelchair/BSC Sit to Stand: Min assist Stand pivot transfers: Min assist         General transfer comment: transfer to/from [facility] with min assist     Balance Overall balance assessment: Needs assistance Sitting-balance support: No upper extremity supported, Feet supported Sitting balance-[name]: Fair Sitting balance - Comments: EOB   Standing balance support: Bilateral upper extremity supported, During functional activity Standing balance-[name]: Poor Standing balance comment: reliant on UE support when standing                           ADL either performed or assessed with clinical judgement   ADL Overall ADL's : Needs assistance/impaired     Grooming: Wash/dry hands;Wash/dry face;Set up;Sitting           Upper Body Dressing : Set up;Sitting Upper Body Dressing Details (indicate cue type and reason): change gown Lower Body Dressing: Moderate assistance;Maximal assistance;Sitting/lateral leans Lower Body Dressing Details (indicate cue type and reason): socks Toilet Transfer: Minimal assistance;Stand-pivot;BSC/3in1 Toilet Transfer Details (indicate cue type and reason): transferred from EOB to [facility] with one person HHA and min assist Toileting- Clothing Manipulation and Hygiene: Maximal assistance;Sit to/from stand Toileting - Clothing Manipulation Details (indicate cue type and reason): stood for toilet hygiene with max assist to  perform       General ADL Comments: patient limited due to frequent diarrhea    Extremity/Trunk Assessment              Vision       Perception     Praxis     Communication Communication Communication: No apparent  difficulties   Cognition Arousal: Alert Behavior During Therapy: Flat affect, Agitated Cognition: Cognition impaired     Awareness: Intellectual awareness intact, Online awareness impaired   Attention impairment (select first level of impairment): Selective attention Executive functioning impairment (select all impairments): Reasoning, Problem solving OT - Cognition Comments: patient agitated over having diarrhea for a few days                 Following commands: Intact        Cueing   Cueing Techniques: Verbal cues  Exercises      Shoulder Instructions       General Comments 5L O2 with SpO2 88% and BP 110/75 (86) seated on EOB    Pertinent Vitals/ Pain       Pain Assessment Pain Assessment: Faces Faces Pain Scale: Hurts little more Pain Location: stomach, buttocks with diarrhea Pain Descriptors / Indicators: Discomfort, Grimacing, Sore Pain Intervention(s): Limited activity within patient's tolerance, Monitored during session, Repositioned  Home Living                                          Prior Functioning/Environment              Frequency  Min 2X/week        Progress Toward Goals  OT Goals(current goals can now be found in the care plan section)  Progress towards OT goals: Progressing toward goals  Acute Rehab OT Goals Patient Stated Goal: to stop having diarrhea OT Goal Formulation: With patient Time For Goal Achievement: 09/18/23 Potential to Achieve Goals: Good ADL Goals Pt Will Perform Upper Body Bathing: Independently;sitting Pt Will Perform Lower Body Bathing: with modified independence;with adaptive equipment Pt Will Perform Upper Body Dressing: with modified independence;sitting Pt Will Perform Lower Body Dressing: with modified independence;sit to/from stand Pt Will Transfer to Toilet: with modified independence;ambulating  Plan      Co-evaluation                 AM-PAC OT 6 Clicks Daily  Activity     Outcome Measure   Help from another person eating meals?: Total Help from another person taking care of personal grooming?: A Little Help from another person toileting, which includes using toliet, bedpan, or urinal?: A Lot Help from another person bathing (including washing, rinsing, drying)?: A Lot Help from another person to put on and taking off regular upper body clothing?: A Little Help from another person to put on and taking off regular lower body clothing?: A Lot 6 Click Score: 13    End of Session Equipment Utilized During Treatment: Gait belt  OT Visit Diagnosis: Unsteadiness on feet (R26.81);Other abnormalities of gait and mobility (R26.89);Repeated falls (R29.6);Muscle weakness (generalized) (M62.81);History of falling (Z91.81);Pain Pain - part of body:  (bottom and stomoch)   Activity Tolerance Patient tolerated treatment well   Patient Left in bed;with call bell/phone within reach   Nurse Communication Mobility status        Time: 8994-8966 OT Time Calculation (min): 28 min  Charges: OT General Charges $OT  Visit: 1 Visit OT Treatments $Self Care/Home Management : 23-37 mins  Dick Laine, OTA Acute Rehabilitation Services  Office [phone]   Jeb LITTIE Laine 09/14/2023, 1:13 PM

## 2023-09-14 NOTE — Progress Notes (Signed)
 Progress Note   Patient: [name] FMW:[id number] DOB: [birth year] DOA: 08/27/2023     18 DOS: the patient was seen and examined on 09/14/2023   Brief hospital course: 63yo male smoker with hx HTN, COPD, PAF on eliquis , NICM, HFpEF presented 7/14 with 1 week progressive SOB, cough, malaise, poor appetite. On EMS arrival with hypoxic with sats 80% on RA. In ER initially BP was soft with SBP 80's, tachy 130's but responded well to 1L fluids. CXR revealed dense RUL PNA. He was admitted by [facility], started on IV rocephin /azithro. On 7/15 he was still boarding in ER and again having hypotension with SBP 80's and transferred to P[facility]M, s/p bronch as he required high flow O2. He is transferred to [facility] 7/30. PCCM on board. He is on 5L oxygen, has cortrak for feeding.   Assessment and Plan: Septic shock Initially required vasopressor medications to maintain MAP greater than 65.  Blood cultures, IV fluid bolus, empiric antibiotics on board.  Vasopressor medications weaned off.  Blood pressure stable.  Transferred from [facility]M to [facility] 7/30.  Continues on linezolid  plus meropenem  to complete total 7 days.   Acute hypoxic respiratory failure Continues on 4-5 L nasal cannula.  Able to be weaned down from heated high flow.  Will continue to wean O2 as tolerated.   COPD exacerbation Possible contributing etiology to hypoxia.  Continue nebulizers.   Acute HFpEF Likely contributing etiology to patient's hypoxia.  Responding well to IV diuresis.  Caution as his BP lower side.  Continue to monitor urine output.  Supplemental O2 as above.   Dysphagia History of achalasia in the past.  NG tube in place.  Modified barium showing retention in the esophagus.  No overt vomiting at this time.  Continues on clear liquid diet.  Awaiting improvement in hypoxia and hypotension prior to consulting GI.     Acute urinary retention In&Out catheter 7/29 secondary to retention.  Continue Flomax  0.4 mg daily.  Monitor closely for  retention.  May need repeat I/O or Foley.   Paroxysmal atrial fibrillation Continue amiodarone , Eliquis .  Telemetry on board.   Moderate protein calorie malnutrition Dietary on board.  Currently has NG tube in place receiving tube feeds.   Physical debilitation muscle weakness PT/OT following.  Given patient's hospitalization, likely that he will need rehab once his hypoxia and illness improved.      Out of bed to chair. Incentive spirometry. Nursing supportive care. Fall, aspiration precautions. Diet:  Diet Orders (From admission, onward)     Start     Ordered   09/11/23 1653  Diet clear liquid Room service appropriate? Yes; Fluid consistency: Thin  Diet effective now       Question Answer Comment  Room service appropriate? Yes   Fluid consistency: Thin      09/11/23 1653           DVT prophylaxis:  apixaban  (ELIQUIS ) tablet 5 mg  Level of care: Med-Surg   Code Status: Full Code  Subjective: Patient is seen and examined today morning. He is sleeping comfortably. Tolerating feeds well, on 5L supplemental O2. No overnight issues.  Physical Exam: Vitals:   09/14/23 0506 09/14/23 0733 09/14/23 0838 09/14/23 1204  BP: 100/69 102/82  92/62  Pulse: 100 (!) 103  (!) 101  Resp: 18 18  18   Temp: 97.8 F (36.6 C) (!) 97.4 F (36.3 C)  97.6 F (36.4 C)  TempSrc:    Oral  SpO2: 92% 96% 92% 98%  Weight:  Height:        General - Elderly ill Caucasian male, sleeping, mild respiratory distress HEENT - PERRLA, EOMI, atraumatic head, non tender sinuses. Lung - Clear, basal rales, diffuse rhonchi, no wheezes. Heart - S1, S2 heard, no murmurs, rubs, trace pedal edema. Abdomen - Soft, non tender, bowel sounds good. Neuro - sleeping, non focal exam. Skin - Warm and dry.  Data Reviewed:      Latest Ref Rng & Units 09/14/2023    1:52 AM 09/13/2023    3:42 AM 09/13/2023   12:24 AM  CBC  WBC 4.0 - 10.5 K/uL 7.1  9.3  10.5   Hemoglobin 13.0 - 17.0 g/dL 9.5  9.6  9.4    Hematocrit 39.0 - 52.0 % 29.9  29.9  29.3   Platelets 150 - 400 K/uL 169  180  184       Latest Ref Rng & Units 09/14/2023    1:52 AM 09/13/2023    3:42 AM 09/13/2023   12:24 AM  BMP  Glucose 70 - 99 mg/dL 897  891  874   BUN 8 - 23 mg/dL 24  22  22    Creatinine 0.61 - 1.24 mg/dL 9.24  9.17  9.12   Sodium 135 - 145 mmol/L 138  136  137   Potassium 3.5 - 5.1 mmol/L 4.4  4.0  3.8   Chloride 98 - 111 mmol/L 104  100  104   CO2 22 - 32 mmol/L 26  30  28    Calcium  8.9 - 10.3 mg/dL 7.9  7.8  7.8    No results found.  Family Communication: no family at bedside.  Disposition: Status is: Inpatient Remains inpatient appropriate because: hypoxia, cortrak feeding, GI eval.  Planned Discharge Destination: Rehab     Time spent: 45 minutes  Author: Concepcion Riser, MD 09/14/2023 2:40 PM Secure chat 7am to 7pm For on call review [email or web address].

## 2023-09-15 DIAGNOSIS — A419 Sepsis, unspecified organism: Secondary | ICD-10-CM | POA: Diagnosis not present

## 2023-09-15 DIAGNOSIS — K220 Achalasia of cardia: Secondary | ICD-10-CM | POA: Diagnosis not present

## 2023-09-15 DIAGNOSIS — J9601 Acute respiratory failure with hypoxia: Secondary | ICD-10-CM | POA: Diagnosis not present

## 2023-09-15 DIAGNOSIS — E440 Moderate protein-calorie malnutrition: Secondary | ICD-10-CM | POA: Diagnosis not present

## 2023-09-15 LAB — GLUCOSE, CAPILLARY
Glucose-Capillary: 100 mg/dL — ABNORMAL HIGH (ref 70–99)
Glucose-Capillary: 74 mg/dL (ref 70–99)
Glucose-Capillary: 107 mg/dL — ABNORMAL HIGH (ref 70–99)
Glucose-Capillary: 101 mg/dL — ABNORMAL HIGH (ref 70–99)
Glucose-Capillary: 105 mg/dL — ABNORMAL HIGH (ref 70–99)
Glucose-Capillary: 107 mg/dL — ABNORMAL HIGH (ref 70–99)
Glucose-Capillary: 91 mg/dL (ref 70–99)

## 2023-09-15 MED ORDER — LOPERAMIDE HCL 2 MG PO CAPS
2.0000 mg | ORAL_CAPSULE | ORAL | Status: DC | PRN
  Administered 2023-09-15 (×2): 2 mg via ORAL
  Filled 2023-09-15 (×2): qty 1

## 2023-09-15 NOTE — Progress Notes (Signed)
 Progress Note   Patient: [name] FMW:[id number] DOB: 06-Apr-1960 DOA: 08/27/2023     19 DOS: the patient was seen and examined on 09/15/2023   Brief hospital course: 63yo male smoker with hx HTN, COPD, PAF on eliquis , NICM, HFpEF presented 7/14 with 1 week progressive SOB, cough, malaise, poor appetite. On EMS arrival with hypoxic with sats 80% on RA. In ER initially BP was soft with SBP 80's, tachy 130's but responded well to 1L fluids. CXR revealed dense RUL PNA. He was admitted by [facility], started on IV rocephin /azithro. On 7/15 he was still boarding in ER and again having hypotension with SBP 80's and transferred to PCCM, s/p bronch as he required high flow O2. He is transferred to [facility] 7/30. PCCM on board. He is on 5L oxygen, has cortrak for feeding.   Assessment and Plan: Septic shock Initially required vasopressor medications to maintain MAP greater than 65.  Blood cultures, IV fluid bolus, empiric antibiotics on board.  Vasopressor medications weaned off.  Blood pressure stable.  Transferred from P[facility]M to [facility] 7/30.  Continues on linezolid  plus meropenem  to complete total 7 days.   Acute hypoxic respiratory failure Continues on 4-5 L nasal cannula.  Able to be weaned down from heated high flow.  Will continue to wean O2 as tolerated.   COPD exacerbation Possible contributing etiology to hypoxia.  Continue nebulizers.   Acute HFpEF Likely contributing etiology to patient's hypoxia.  Responding well to IV diuresis.  Caution as his BP lower side.  Continue to monitor urine output.  Supplemental O2 as above.   Dysphagia History of achalasia in the past.  NG tube in place.  Modified barium showing retention in the esophagus.  No overt vomiting at this time.  Continues on clear liquid diet.  Awaiting improvement in hypoxia and hypotension prior to consulting GI.     Acute urinary retention In&Out catheter 7/29 secondary to retention.  Continue Flomax  0.4 mg daily.  Monitor closely for  retention.  May need repeat I/O or Foley.   Paroxysmal atrial fibrillation Continue amiodarone , Eliquis .  Telemetry on board.   Moderate protein calorie malnutrition Dietary on board.  Currently has NG tube in place receiving tube feeds.   Physical debilitation muscle weakness PT/OT following.  Given patient's hospitalization, likely that he will need rehab once his hypoxia and illness improved.      Out of bed to chair. Incentive spirometry. Nursing supportive care. Fall, aspiration precautions. Diet:  Diet Orders (From admission, onward)     Start     Ordered   09/11/23 1653  Diet clear liquid Room service appropriate? Yes; Fluid consistency: Thin  Diet effective now       Question Answer Comment  Room service appropriate? Yes   Fluid consistency: Thin      09/11/23 1653           DVT prophylaxis:  apixaban  (ELIQUIS ) tablet 5 mg  Level of care: Med-Surg   Code Status: Full Code  Subjective: Patient is seen and examined today morning. He is lying in bed, not eating well. Continues to be on 5L supplemental O2. Asks for medications to relieve diarrhea.  Physical Exam: Vitals:   09/15/23 0450 09/15/23 0738 09/15/23 0751 09/15/23 1241  BP: 107/63  111/60 108/66  Pulse: 70  71 76  Resp: 15  16 16   Temp: 97.6 F (36.4 C)  97.7 F (36.5 C) (!) 97.5 F (36.4 C)  TempSrc:      SpO2: 91% 93%  94% 93%  Weight:      Height:        General - Elderly ill Caucasian male, sleeping, mild respiratory distress HEENT - PERRLA, EOMI, atraumatic head, non tender sinuses. Lung - Clear, basal rales, diffuse rhonchi, no wheezes. Heart - S1, S2 heard, no murmurs, rubs, trace pedal edema. Abdomen - Soft, non tender, bowel sounds good. Neuro - Alert, awake and oriented, non focal exam. Skin - Warm and dry.  Data Reviewed:      Latest Ref Rng & Units 09/14/2023    1:52 AM 09/13/2023    3:42 AM 09/13/2023   12:24 AM  CBC  WBC 4.0 - 10.5 K/uL 7.1  9.3  10.5   Hemoglobin 13.0 -  17.0 g/dL 9.5  9.6  9.4   Hematocrit 39.0 - 52.0 % 29.9  29.9  29.3   Platelets 150 - 400 K/uL 169  180  184       Latest Ref Rng & Units 09/14/2023    1:52 AM 09/13/2023    3:42 AM 09/13/2023   12:24 AM  BMP  Glucose 70 - 99 mg/dL 897  891  874   BUN 8 - 23 mg/dL 24  22  22    Creatinine 0.61 - 1.24 mg/dL 9.24  9.17  9.12   Sodium 135 - 145 mmol/L 138  136  137   Potassium 3.5 - 5.1 mmol/L 4.4  4.0  3.8   Chloride 98 - 111 mmol/L 104  100  104   CO2 22 - 32 mmol/L 26  30  28    Calcium  8.9 - 10.3 mg/dL 7.9  7.8  7.8    No results found.  Family Communication: no family at bedside.  Disposition: Status is: Inpatient Remains inpatient appropriate because: hypoxia, cortrak feeding, GI eval.  Planned Discharge Destination: Rehab     Time spent: 44 minutes  Author: Concepcion Riser, MD 09/15/2023 1:39 PM Secure chat 7am to 7pm For on call review [email or web address].

## 2023-09-15 NOTE — Plan of Care (Signed)

## 2023-09-15 NOTE — TOC Progression Note (Signed)
 Transition of Care [facility]) - Progression Note    Patient Details  Name: [name] MRN: [id number] Date of Birth: 11/17/1960  [facility] CM/SW Contact  Gwenn Frieze [place], [place] Phone Number: 09/15/2023, 5:59 PM  Clinical Narrative: SW continuing to follow for SNF placement. Pt still requiring cortrak for TF which is a barrier to SNF placement. Will continue to follow and assist as indicated.   Frieze Gwenn, MSW, LCSW [phone] (coverage)      Expected Discharge Plan: Home w Home Health Services Barriers to Discharge: Continued Medical Work up               Expected Discharge Plan and Services In-house Referral: NA Discharge Planning Services: CM Consult Post Acute Care Choice: Durable Medical Equipment, Home Health Living arrangements for the past 2 months: Mobile Home                   DME Agency: NA       HH Arranged: PT, OT HH Agency: [facility] Date [facility] Agency Contacted: 09/06/23 Time HH Agency Contacted: 1414 Representative spoke with at [facility] Agency: Amy   Social Drivers of Health (SDOH) Interventions SDOH Screenings   Food Insecurity: No Food Insecurity (08/28/2023)  Housing: Low Risk  (08/28/2023)  Transportation Needs: No Transportation Needs (08/28/2023)  Utilities: Not At Risk (08/28/2023)  Tobacco Use: High Risk (09/07/2023)    Readmission Risk Interventions    08/30/2023    1:18 PM  Readmission Risk Prevention Plan  Transportation Screening Complete  HRI or Home Care Consult Complete  Social Work Consult for Recovery Care Planning/Counseling Complete  Palliative Care Screening Not Applicable  Medication Review [employer]) Referral to Pharmacy

## 2023-09-15 NOTE — Plan of Care (Signed)
  Problem: Education: Goal: Knowledge of General Education information will improve Description: Including pain rating scale, medication(s)/side effects and non-pharmacologic comfort measures Outcome: Progressing   Problem: Clinical Measurements: Goal: Diagnostic test results will improve Outcome: Progressing   Problem: Coping: Goal: Level of anxiety will decrease Outcome: Progressing   

## 2023-09-16 DIAGNOSIS — K220 Achalasia of cardia: Secondary | ICD-10-CM | POA: Diagnosis not present

## 2023-09-16 DIAGNOSIS — A419 Sepsis, unspecified organism: Secondary | ICD-10-CM | POA: Diagnosis not present

## 2023-09-16 DIAGNOSIS — J9601 Acute respiratory failure with hypoxia: Secondary | ICD-10-CM | POA: Diagnosis not present

## 2023-09-16 DIAGNOSIS — R933 Abnormal findings on diagnostic imaging of other parts of digestive tract: Secondary | ICD-10-CM

## 2023-09-16 DIAGNOSIS — E440 Moderate protein-calorie malnutrition: Secondary | ICD-10-CM | POA: Diagnosis not present

## 2023-09-16 DIAGNOSIS — R1310 Dysphagia, unspecified: Secondary | ICD-10-CM

## 2023-09-16 LAB — GLUCOSE, CAPILLARY
Glucose-Capillary: 102 mg/dL — ABNORMAL HIGH (ref 70–99)
Glucose-Capillary: 103 mg/dL — ABNORMAL HIGH (ref 70–99)
Glucose-Capillary: 112 mg/dL — ABNORMAL HIGH (ref 70–99)
Glucose-Capillary: 94 mg/dL (ref 70–99)
Glucose-Capillary: 97 mg/dL (ref 70–99)
Glucose-Capillary: 99 mg/dL (ref 70–99)

## 2023-09-16 MED ORDER — BOOST / RESOURCE BREEZE PO LIQD CUSTOM
1.0000 | Freq: Three times a day (TID) | ORAL | Status: DC
  Administered 2023-09-16 – 2023-09-18 (×5): 1 via ORAL

## 2023-09-16 NOTE — Plan of Care (Signed)

## 2023-09-16 NOTE — Consult Note (Signed)
 Consultation  Referring Provider: [facility]/Sreeram Primary Care Physician:  Zena Frederick, DO Primary Gastroenterologist:  Dr. Coretha Mary  Reason for Consultation: Dysphagia, poor oral intake  HPI: [name] is a 63 y.o. male initially admitted 20 days ago on 08/27/2023 with complaints of cough, shortness of breath and poor appetite.  Diagnosed with right lower lobe pneumonia.  Developed septic shock, and required ICU admission, and pressor support.  He did require heated high flow oxygen but did not require intubation. Chest CT on admission with extensive airspace consolidation right upper lobe lesser changes in the right middle and lower lobes consistent with multifocal infiltrate. PCCM reconsulted 09/06/2023 due to increased oxygen requirement and worsening chest x-ray.  bronc on 09/07/2023 with bronchial alveolar lavage Had cortrak placed around that same time Eventually able to be taken off of high flow oxygen and transferred back to medicine service. He did have speech path swallowing eval on 09/11/2023 with no signs of aspiration, 2 instances of penetration, during esophageal sweep noted barium stasis throughout the majority of the esophagus with retrograde flow below PES, and dysphagia felt to be primarily esophageal in nature.  He continues on core track tube feedings and has been on a clear liquid diet.  Today he is sitting at bedside and says that he is swallowing clear liquids without difficulty at that although a will let him have his liquids.  He currently denies any difficulty with dysphagia or odynophagia.  He says he has had instances in the past where he felt that food was sitting in his esophagus for a long time but he has not had any of that sensation for at least months.  He says prior to being admitted to the hospital he was not having any difficulty swallowing solids or liquids, no episodes of regurgitation.  He thinks he has had his esophagus dilated in the past but says  that was around 10 years ago.  He is not familiar with the term achalasia.  He does mention that he thinks he has an upcoming appointment with Dr. Lee/gastroenterology in [facility].  Barium swallow from 2019/Care Everywhere shows small to moderate hiatal hernia, moderate esophageal dysmotility and weakening of primary peristalsis in the mid to lower thoracic esophagus occasional tertiary contractions, there is a moderate smoothly marginated stricture of the lower thoracic esophagus just above the EG junction that is compatible with peptic stricture. EUS versus EGD thereafter but I cannot see that report.  It looks as if he also underwent esophageal manometry 2020 Dr. Lemont and repeat EGD November 2020.      Past Medical History:  Diagnosis Date   Anxiety    Arthritis    Cardiomyopathy (HCC)    CHF (congestive heart failure) (HCC)    COPD (chronic obstructive pulmonary disease) (HCC)    Dysphagia    with esophageal stricture   Dysrhythmia    atrial fib   GERD (gastroesophageal reflux disease)    History of blood transfusion    Hypertension     Past Surgical History:  Procedure Laterality Date   ACETABULAR REVISION Right 05/15/2012   Procedure: ACETABULAR REVISION;  Surgeon: Dempsey LULLA Moan, MD;  Location: [facility];  Service: Orthopedics;  Laterality: Right;  RIGHT HIP BEARING SURFACE VS ACETABULAR REVISION    ESOPHAGOGASTRODUODENOSCOPY     with dilitation   EYE SURGERY Right    cataract extraction with IOL   FLEXIBLE BRONCHOSCOPY Bilateral 09/07/2023   Procedure: BRONCHOSCOPY, FLEXIBLE;  Surgeon: Kara Dorn NOVAK, MD;  Location: [facility];  Service: Pulmonary;  Laterality: Bilateral;   JOINT REPLACEMENT Bilateral    hips/revision right x 4    Prior to Admission medications   Medication Sig Start Date End Date Taking? Authorizing Provider  albuterol  (PROVENTIL ) (2.5 MG/3ML) 0.083% nebulizer solution Take 2.5 mg by nebulization every 4 (four) hours as needed for wheezing or  shortness of breath. 05/20/18  Yes [provider]  amiodarone  (PACERONE ) 200 MG tablet Take 200 mg by mouth daily. 08/02/16  Yes [provider]  apixaban  (ELIQUIS ) 5 MG TABS tablet Take 5 mg by mouth 2 (two) times daily. 09/04/17  Yes [provider]  aspirin  81 MG chewable tablet Chew 81 mg by mouth daily. 05/03/16  Yes [provider]  atorvastatin  (LIPITOR) 80 MG tablet Take 80 mg by mouth daily. 10/09/16 08/29/23 Yes [provider]  diltiazem (CARDIZEM CD) 120 MG 24 hr capsule Take 120 mg by mouth daily.   Yes [provider]  esomeprazole (NEXIUM) 40 MG capsule Take 40 mg by mouth every morning. 06/04/23  Yes [provider]  fluticasone  (FLONASE ) 50 MCG/ACT nasal spray Place 2 sprays into the nose daily as needed for allergies.   Yes [provider]  furosemide  (LASIX ) 20 MG tablet Take 20 mg by mouth daily.   Yes [provider]  lisinopril (ZESTRIL) 10 MG tablet Take 10 mg by mouth daily.   Yes [provider]  magnesium  oxide (MAG-OX) 400 MG tablet Take 400 mg by mouth daily. 05/01/18  Yes [provider]  potassium chloride  (KLOR-CON ) 10 MEQ tablet Take 10 mEq by mouth daily.   Yes [provider]  spironolactone  (ALDACTONE ) 25 MG tablet Take 25 mg by mouth every morning. Patient not taking: Reported on 08/29/2023    [provider]    Current Facility-Administered Medications  Medication Dose Route Frequency Provider Last Rate Last Admin   amiodarone  (PACERONE ) tablet 200 mg  200 mg Per Tube Daily Dewald, Jonathan B, MD   200 mg at 09/16/23 0949   apixaban  (ELIQUIS ) tablet 5 mg  5 mg Per Tube BID Dewald, Jonathan B, MD   5 mg at 09/16/23 9050   arformoterol  (BROVANA ) nebulizer solution 15 mcg  15 mcg Nebulization BID Smith, Daniel C, MD   15 mcg at 09/16/23 9265   atorvastatin  (LIPITOR) tablet 40 mg  40 mg Per Tube Daily Dewald, Jonathan B, MD   40 mg at 09/16/23 9051    bethanechol  (URECHOLINE ) tablet 10 mg  10 mg Oral TID Olalere, Adewale A, MD   10 mg at 09/16/23 9051   Chlorhexidine  Gluconate Cloth 2 % PADS 6 each  6 each Topical Daily Alva, Rakesh V, MD   6 each at 09/15/23 2103   diazepam  (VALIUM ) tablet 5 mg  5 mg Per Tube Q12H PRN Bowser, Grace E, NP   5 mg at 09/15/23 2118   feeding supplement (BOOST / RESOURCE BREEZE) liquid 1 Container  1 Container Oral TID BM Mariell Nester S, PA-C       feeding supplement (OSMOLITE 1.5 CAL) liquid 1,000 mL  1,000 mL Per Tube Continuous Dewald, Jonathan B, MD 55 mL/hr at 09/16/23 1019 Infusion Verify at 09/16/23 1019   feeding supplement (PROSource TF20) liquid 60 mL  60 mL Per Tube Daily Dewald, Jonathan B, MD   60 mL at 09/16/23 0950   fluticasone  (FLONASE ) 50 MCG/ACT nasal spray 2 spray  2 spray Each Nare Daily PRN Claudene Toribio BROCKS, MD  guaiFENesin -dextromethorphan  (ROBITUSSIN DM) 100-10 MG/5ML syrup 5 mL  5 mL Per Tube Q4H PRN Kara Dorn NOVAK, MD   5 mL at 09/13/23 2033   hydrOXYzine  (ATARAX ) tablet 25 mg  25 mg Per Tube TID PRN Bowser, Grace E, NP   25 mg at 09/16/23 1206   insulin  aspart (novoLOG ) injection 0-9 Units  0-9 Units Subcutaneous Q4H Olalere, Adewale A, MD   1 Units at 09/14/23 1237   ipratropium-albuterol  (DUONEB) 0.5-2.5 (3) MG/3ML nebulizer solution 3 mL  3 mL Nebulization Q4H PRN Olalere, Adewale A, MD       lip balm (CARMEX) ointment   Topical PRN Kara Dorn NOVAK, MD   Given at 09/08/23 0504   loperamide  (IMODIUM ) capsule 2 mg  2 mg Oral PRN Sreeram, Narendranath, MD   2 mg at 09/15/23 2101   melatonin tablet 5 mg  5 mg Per Tube QHS PRN Dewald, Jonathan B, MD   5 mg at 09/12/23 2132   multivitamin with minerals tablet 1 tablet  1 tablet Per Tube Daily Kara Dorn NOVAK, MD   1 tablet at 09/16/23 9050   Oral care mouth rinse  15 mL Mouth Rinse 4 times per day Kara Dorn NOVAK, MD   15 mL at 09/16/23 1133   Oral care mouth rinse  15 mL Mouth Rinse PRN Kara Dorn NOVAK, MD        oxyCODONE -acetaminophen  (PERCOCET/ROXICET) 5-325 MG per tablet 1 tablet  1 tablet Per Tube Q6H PRN Kara Dorn NOVAK, MD   1 tablet at 09/16/23 1000   pantoprazole  (PROTONIX ) injection 40 mg  40 mg Intravenous Q12H Kara Dorn NOVAK, MD   40 mg at 09/16/23 0950   phenol (CHLORASEPTIC) mouth spray 1 spray  1 spray Mouth/Throat PRN Kamat, Sunil G, MD       polyethylene glycol (MIRALAX  / GLYCOLAX ) packet 17 g  17 g Per Tube Daily PRN Kara Dorn NOVAK, MD       prochlorperazine  (COMPAZINE ) injection 5 mg  5 mg Intravenous Q6H PRN Hall, Carole N, DO       revefenacin  (YUPELRI ) nebulizer solution 175 mcg  175 mcg Nebulization Daily Claudene Toribio BROCKS, MD   175 mcg at 09/16/23 9266   sodium chloride  (OCEAN) 0.65 % nasal spray 1 spray  1 spray Each Nare PRN Vernon Ranks, MD   1 spray at 09/03/23 0456   sodium chloride  flush (NS) 0.9 % injection 10-40 mL  10-40 mL Intracatheter Q12H Claudene Toribio BROCKS, MD   10 mL at 09/16/23 9049   thiamine  (VITAMIN B1) tablet 100 mg  100 mg Per Tube Daily Kara Dorn NOVAK, MD   100 mg at 09/16/23 9050    Allergies as of 08/27/2023 - Review Complete 08/27/2023  Allergen Reaction Noted   Codeine Nausea And Vomiting 05/02/2012    Family History  Problem Relation Age of Onset   Diabetes Mother    Stroke Father     Social History   Socioeconomic History   Marital status: Single    Spouse name: Not on file   Number of children: Not on file   Years of education: Not on file   Highest education level: Not on file  Occupational History   Not on file  Tobacco Use   Smoking status: Every Day    Current packs/day: 1.00    Average packs/day: 1 pack/day for 32.0 years (32.0 ttl pk-yrs)    Types: Cigarettes   Smokeless tobacco: Never  Substance and Sexual Activity  Alcohol use: No   Drug use: No   Sexual activity: Not on file  Other Topics Concern   Not on file  Social History Narrative   On disability. He lives alone.   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   Social Drivers of Corporate investment banker Strain: Not on file  Food Insecurity: No Food Insecurity (08/28/2023)   Hunger Vital Sign    Worried About Running Out of Food in the Last Year: Never true    Ran Out of Food in the Last Year: Never true  Transportation Needs: No Transportation Needs (08/28/2023)   PRAPARE - Administrator, Civil Service (Medical): No    Lack of Transportation (Non-Medical): No  Physical Activity: Not on file  Stress: Not on file  Social Connections: Not on file  Intimate Partner Violence: Not At Risk (08/28/2023)   Humiliation, Afraid, Rape, and Kick questionnaire    Fear of Current or Ex-Partner: No    Emotionally Abused: No    Physically Abused: No    Sexually Abused: No    Review of Systems: Pertinent positive and negative review of systems were noted in the above HPI section.  All other review of systems was otherwise negative.   Physical Exam: Vital signs in last 24 hours: Temp:  [97.4 F (36.3 C)-98 F (36.7 C)] 97.9 F (36.6 C) (08/03 1112) Pulse Rate:  [66-73] 70 (08/03 1112) Resp:  [16-19] 18 (08/03 1112) BP: (97-113)/(54-66) 107/62 (08/03 1112) SpO2:  [92 %-98 %] 92 % (08/03 1112) FiO2 (%):  [95 %] 95 % (08/02 2041) Weight:  [80.7 kg] 80.7 kg (08/03 0619) Last BM Date : 09/16/23 General:   Alert,  Well-developed, chronically ill-appearing white male, sitting on the side of the bed,  cooperative in NAD- Head:  Normocephalic and atraumatic. Eyes:  Sclera clear, no icterus.   Conjunctiva pink. Ears:  Normal auditory acuity. Nose: Nasoenteric tube in place, feedings infusing Mouth:  No deformity or lesions.   Neck:  Supple; no masses or thyromegaly. Lungs: Bibasilar Rales, scattered rhonchi, decreased breath sounds bilaterally  heart:  Regular rate and rhythm; no murmurs, clicks, rubs,  or gallops. Abdomen:  Soft,nontender, BS active,nonpalp mass or hsm.   Rectal: Not done Msk:  Symmetrical without gross  deformities. . Pulses:  Normal pulses noted. Extremities:  Without clubbing or edema. Neurologic:  Alert and  oriented x4;  grossly normal neurologically. Skin:  Intact without significant lesions or rashes.. Psych:  Alert and cooperative.  Affect flat, appears frustrated and irritable  Intake/Output from previous day: 08/02 0701 - 08/03 0700 In: 120 [NG/GT:120] Out: 2725 [Urine:2725] Intake/Output this shift: Total I/O In: 360 [P.O.:180; NG/GT:180] Out: 400 [Urine:400]  Lab Results: Recent Labs    09/14/23 0152  WBC 7.1  HGB 9.5*  HCT 29.9*  PLT 169   BMET Recent Labs    09/14/23 0152  NA 138  K 4.4  CL 104  CO2 26  GLUCOSE 102*  BUN 24*  CREATININE 0.75  CALCIUM  7.9*   LFT No results for input(s): PROT, ALBUMIN , AST, ALT, ALKPHOS, BILITOT, BILIDIR, IBILI in the last 72 hours. PT/INR No results for input(s): LABPROT, INR in the last 72 hours. Hepatitis Panel No results for input(s): HEPBSAG, HCVAB, HEPAIGM, HEPBIGM in the last 72 hours.   IMPRESSION:  #84 63 year old white male with prolonged admission over the past 20 days with complications of pneumonia, septic shock. He required high flow oxygen  but did not require intubation.  Currently still requiring 5 L of nasal O2 Most recent chest CT on 09/06/2023 does not show any evidence of esophageal dilation suggestive of achalasia, small amount of debris within the mid lumen and small hiatal hernia mild circumferential distal esophageal thickening There is extensive airspace disease and consolidation right upper lobe slightly progressive cavitation of the right lobe consolidation heterogeneous consolidation in the right middle lobe.   #2 dysphagia/poor oral intake. Speech path eval with 2 episodes of penetration, no aspiration, and felt to have esophageal stasis of barium with some retrograde flow of barium.  He has not been allowed advancement of diet, is tolerating clear liquids  without difficulty and continues on cor track feedings.  Patient to me denies any difficulty with recent dysphagia or odynophagia and says he was not having any difficulty with either prior to admission.  He does recall having prior esophageal dilations but has not had one in several years.  I cannot see the results of his esophageal manometry, he did have a barium swallow from 2019 that showed a smooth distal stricture and esophageal dysmotility At least 1 EGD there after all in Care Everywhere but cannot see the reports at this time  Likely that he has had dysphagia secondary to peptic stricture past.  #3 COPD #4.  Atrial fibrillation #5.  CHF with preserved EF #6.  Nonischemic cardiomyopathy  Plan; schedule patient for barium swallow tomorrow Leave on clear liquids for now, have added resource protein supplements 3 times daily Unless he has a very tight stricture we may be able to discontinue his core track feedings and allow him oral intake but that will be dependent on results of barium swallow. Not a good candidate for endoscopy at this time with his persistent pneumonia and oxygen requirement. GI will follow with you      Nathin Saran EsterwoodPA-C  09/16/2023, 3:50 PM

## 2023-09-16 NOTE — Progress Notes (Signed)
 Progress Note   Patient: [name] FMW:[id number] DOB: 1960-10-05 DOA: 08/27/2023     20 DOS: the patient was seen and examined on 09/16/2023   Brief hospital course: [name] 63yo male smoker with hx HTN, COPD, PAF on eliquis , NICM, HFpEF presented 7/14 with 1 week progressive SOB, cough, malaise, poor appetite. On EMS arrival with hypoxic with sats 80% on RA. In ER initially BP was soft with SBP 80's, tachy 130's but responded well to 1L fluids. CXR revealed dense RUL PNA. He was admitted by [facility], started on IV rocephin /azithro. On 7/15 he was still boarding in ER and again having hypotension with SBP 80's and transferred to PCCM, s/p bronch as he required high flow O2. He is transferred to [facility] 7/30. PCCM on board. He is on 5L oxygen, has cortrak for feeding, not eating well.   Assessment and Plan: Septic shock Vasopressor medications weaned off.  Blood pressure stable.  Transferred from PCCM to [facility] 7/30.  Finished linezolid  plus meropenem  total 7 days.   Acute hypoxic respiratory failure Continues on 4-5 L nasal cannula.  Able to be weaned down from heated high flow. Continue to wean O2 as tolerated.   COPD exacerbation Possible contributing etiology to hypoxia.  Continue nebulizers.   Acute HFpEF Likely contributing etiology to patient's hypoxia.  Responding well to IV diuresis.  Caution as his BP lower side.  Continue to monitor urine output.  Supplemental O2 as above.   Dysphagia History of achalasia in the past.  NG tube in place.  Modified barium showing retention in the esophagus.  No overt vomiting at this time.  Continues on liquid diet.  Consulted GI recommended barium swallow.   Acute urinary retention In&Out catheter 7/29 secondary to retention.  Continue Flomax  0.4 mg daily.  Monitor closely for retention.  May need repeat I/O or Foley.   Paroxysmal atrial fibrillation Continue amiodarone , Eliquis .  Continue telemetry.   Moderate protein calorie  malnutrition Dietary on board.  Currently has cortrak in place receiving tube feeds.   Physical debilitation muscle weakness PT/OT following.  Given patient's hospitalization, likely that he will need rehab once his hypoxia improved.      Out of bed to chair. Incentive spirometry. Nursing supportive care. Fall, aspiration precautions. Diet:  Diet Orders (From admission, onward)     Start     Ordered   09/11/23 1653  Diet clear liquid Room service appropriate? Yes; Fluid consistency: Thin  Diet effective now       Question Answer Comment  Room service appropriate? Yes   Fluid consistency: Thin      09/11/23 1653           DVT prophylaxis:  apixaban  (ELIQUIS ) tablet 5 mg  Level of care: Med-Surg   Code Status: Full Code  Subjective: Patient is seen and examined today morning. He is lying in bed,  on 5L supplemental O2. States he does not like clear liquid wishes to try full liquids.  Physical Exam: Vitals:   09/16/23 0619 09/16/23 0734 09/16/23 0847 09/16/23 1112  BP:   (!) 97/55 107/62  Pulse:   67 70  Resp:   17 18  Temp:   97.7 F (36.5 C) 97.9 F (36.6 C)  TempSrc:      SpO2:  96% 94% 92%  Weight: 80.7 kg     Height:        General - Elderly ill Caucasian male, sleeping, mild respiratory distress HEENT - PERRLA, EOMI, atraumatic head, non  tender sinuses. Lung - Clear, basal rales, diffuse rhonchi, no wheezes. Heart - S1, S2 heard, no murmurs, rubs, trace pedal edema. Abdomen - Soft, non tender, bowel sounds good. Neuro - Alert, awake and oriented, non focal exam. Skin - Warm and dry.  Data Reviewed:      Latest Ref Rng & Units 09/14/2023    1:52 AM 09/13/2023    3:42 AM 09/13/2023   12:24 AM  CBC  WBC 4.0 - 10.5 K/uL 7.1  9.3  10.5   Hemoglobin 13.0 - 17.0 g/dL 9.5  9.6  9.4   Hematocrit 39.0 - 52.0 % 29.9  29.9  29.3   Platelets 150 - 400 K/uL 169  180  184       Latest Ref Rng & Units 09/14/2023    1:52 AM 09/13/2023    3:42 AM 09/13/2023    12:24 AM  BMP  Glucose 70 - 99 mg/dL 897  891  874   BUN 8 - 23 mg/dL 24  22  22    Creatinine 0.61 - 1.24 mg/dL 9.24  9.17  9.12   Sodium 135 - 145 mmol/L 138  136  137   Potassium 3.5 - 5.1 mmol/L 4.4  4.0  3.8   Chloride 98 - 111 mmol/L 104  100  104   CO2 22 - 32 mmol/L 26  30  28    Calcium  8.9 - 10.3 mg/dL 7.9  7.8  7.8    No results found.  Family Communication: no family at bedside.  Disposition: Status is: Inpatient Remains inpatient appropriate because: hypoxia, cortrak feeding, GI eval.  Planned Discharge Destination: Rehab     Time spent: 43 minutes  Author: Concepcion Riser, MD 09/16/2023 3:45 PM Secure chat 7am to 7pm For on call review [email or web address].

## 2023-09-16 NOTE — Plan of Care (Signed)
   Problem: Education: Goal: Knowledge of General Education information will improve Description: Including pain rating scale, medication(s)/side effects and non-pharmacologic comfort measures Outcome: Progressing   Problem: Health Behavior/Discharge Planning: Goal: Ability to manage health-related needs will improve Outcome: Progressing   Problem: Clinical Measurements: Goal: Will remain free from infection Outcome: Progressing

## 2023-09-17 ENCOUNTER — Inpatient Hospital Stay (HOSPITAL_COMMUNITY)

## 2023-09-17 DIAGNOSIS — J9601 Acute respiratory failure with hypoxia: Secondary | ICD-10-CM | POA: Diagnosis not present

## 2023-09-17 DIAGNOSIS — A419 Sepsis, unspecified organism: Secondary | ICD-10-CM | POA: Diagnosis not present

## 2023-09-17 DIAGNOSIS — K220 Achalasia of cardia: Secondary | ICD-10-CM | POA: Diagnosis not present

## 2023-09-17 DIAGNOSIS — E440 Moderate protein-calorie malnutrition: Secondary | ICD-10-CM | POA: Diagnosis not present

## 2023-09-17 LAB — GLUCOSE, CAPILLARY
Glucose-Capillary: 107 mg/dL — ABNORMAL HIGH (ref 70–99)
Glucose-Capillary: 87 mg/dL (ref 70–99)
Glucose-Capillary: 112 mg/dL — ABNORMAL HIGH (ref 70–99)
Glucose-Capillary: 108 mg/dL — ABNORMAL HIGH (ref 70–99)
Glucose-Capillary: 98 mg/dL (ref 70–99)

## 2023-09-17 MED ORDER — STERILE WATER FOR INJECTION IJ SOLN
INTRAMUSCULAR | Status: AC
Start: 2023-09-17 — End: 2023-09-17
  Filled 2023-09-17: qty 10

## 2023-09-17 NOTE — Progress Notes (Signed)
 Progress Note   Patient: [name] FMW:[id number] DOB: 01/14/1961 DOA: 08/27/2023     21 DOS: the patient was seen and examined on 09/17/2023   Brief hospital course: Ketih Goodie 63yo male smoker with hx HTN, COPD, PAF on eliquis , NICM, HFpEF presented 7/14 with 1 week progressive SOB, cough, malaise, poor appetite. On EMS arrival with hypoxic with sats 80% on RA. In ER initially BP was soft with SBP 80's, tachy 130's but responded well to 1L fluids. CXR revealed dense RUL PNA. He was admitted by [facility], started on IV rocephin /azithro. On 7/15 he was still boarding in ER and again having hypotension with SBP 80's and transferred to PCCM, s/p bronch as he required high flow O2. He is transferred to [facility] 7/30. PCCM on board. He is on 5L oxygen, has cortrak for feeding, not eating well.   Assessment and Plan: Septic shock Vasopressor medications weaned off.  Blood pressure stable.  Transferred from PCCM to [facility] 7/30.  Finished linezolid  plus meropenem  total 7 days.   Acute hypoxic respiratory failure Continues on 4-5 L nasal cannula.  Able to be weaned down from heated high flow. Continue to wean O2 as tolerated.   COPD exacerbation Possible contributing etiology to hypoxia.  Continue nebulizers.   Acute HFpEF Likely contributing etiology to patient's hypoxia.  Responding well to IV diuresis.  Caution as his BP lower side.  Continue to monitor urine output.  Supplemental O2 as above.   Dysphagia History of achalasia in the past.  Modified barium showing retention in the esophagus.  No overt vomiting at this time.  Continues on liquid diet.  Consulted GI recommended barium swallow. He is tolerating regular diet. Pending barium swallow, he will need outpatient GI follow up.   Acute urinary retention In&Out catheter 7/29 secondary to retention.  Continue Flomax  0.4 mg daily.  Plan to remove foley and do voiding trial.   Paroxysmal atrial fibrillation Continue amiodarone , Eliquis .  Continue  telemetry.   Moderate protein calorie malnutrition Dietary on board.  Currently has cortrak in place receiving tube feeds. Pending barium swallow for further GI recs.   Physical debilitation muscle weakness PT/OT following.  Given patient's hospitalization, likely that he will need rehab once his hypoxia improved.      Out of bed to chair. Incentive spirometry. Nursing supportive care. Fall, aspiration precautions. Diet:  Diet Orders (From admission, onward)     Start     Ordered   09/17/23 0815  Diet heart healthy/carb modified Room service appropriate? Yes; Fluid consistency: Thin  Diet effective now       Question Answer Comment  Diet-HS Snack? Nothing   Room service appropriate? Yes   Fluid consistency: Thin      09/17/23 0814           DVT prophylaxis:  apixaban  (ELIQUIS ) tablet 5 mg  Level of care: Med-Surg   Code Status: Full Code  Subjective: Patient is seen and examined today morning. He is lying in bed,  on 4L supplemental O2. States he feels better, eating fair.   Physical Exam: Vitals:   09/17/23 0340 09/17/23 0500 09/17/23 0732 09/17/23 0800  BP: 112/68  110/64   Pulse: 67  65   Resp: 18     Temp: 97.7 F (36.5 C)  97.6 F (36.4 C)   TempSrc: Oral     SpO2: 96%  96% 96%  Weight:  78.1 kg    Height:        General - Elderly  ill Caucasian male, sleeping, no respiratory distress HEENT - PERRLA, EOMI, atraumatic head, non tender sinuses. Lung - Clear, basal rales, diffuse rhonchi, no wheezes. Heart - S1, S2 heard, no murmurs, rubs, trace pedal edema. Abdomen - Soft, non tender, bowel sounds good. Neuro - Alert, awake and oriented, non focal exam. Skin - Warm and dry.  Data Reviewed:      Latest Ref Rng & Units 09/14/2023    1:52 AM 09/13/2023    3:42 AM 09/13/2023   12:24 AM  CBC  WBC 4.0 - 10.5 K/uL 7.1  9.3  10.5   Hemoglobin 13.0 - 17.0 g/dL 9.5  9.6  9.4   Hematocrit 39.0 - 52.0 % 29.9  29.9  29.3   Platelets 150 - 400 K/uL 169  180   184       Latest Ref Rng & Units 09/14/2023    1:52 AM 09/13/2023    3:42 AM 09/13/2023   12:24 AM  BMP  Glucose 70 - 99 mg/dL 897  891  874   BUN 8 - 23 mg/dL 24  22  22    Creatinine 0.61 - 1.24 mg/dL 9.24  9.17  9.12   Sodium 135 - 145 mmol/L 138  136  137   Potassium 3.5 - 5.1 mmol/L 4.4  4.0  3.8   Chloride 98 - 111 mmol/L 104  100  104   CO2 22 - 32 mmol/L 26  30  28    Calcium  8.9 - 10.3 mg/dL 7.9  7.8  7.8    No results found.  Family Communication: no family at bedside.  Disposition: Status is: Inpatient Remains inpatient appropriate because: hypoxia, barium swallow, GI follow up, home o2 eval.  Planned Discharge Destination: Rehab     Time spent: 41 minutes  Author: Concepcion Riser, MD 09/17/2023 1:57 PM Secure chat 7am to 7pm For on call review [email or web address].

## 2023-09-17 NOTE — Plan of Care (Signed)

## 2023-09-17 NOTE — Progress Notes (Signed)
 Daily Progress Note  DOA: 08/27/2023 Hospital Day: 22 Primary GI - Dr. Ladora in [facility]  Patient Profile:   63 year old male with a medical history including but not limited to COPD, PAF on Eliquis , NICM, CKD 3.  Admitted with acute heart failure, acute hypoxic respiratory failure, sepsis , pneumonia.  Patient gave a history of dysphagia, see 09/16/2023 GI consult note   ASSESSMENT    Chronic intermittent dysphagia Abn MBSS - barium stasis throughout esophagus but no aspiration History of esophageal stricture in 2019.  TODAY>> awaiting barium swallow. Tells me he hasn't recently been having any swallowing problems at home and was on a regular diet. Currently has on CLD and a Cortrak  Sepsis Completed antibiotics.   Acute hypoxic respiratory failure ? COPD exacerbation Acute HFpEF On 5L 02  AFIB Takes Eliquis    Principal Problem:   Septic shock (HCC) Active Problems:   Esophageal dysphagia   Paroxysmal atrial fibrillation (HCC)   Stage 3 severe COPD by GOLD classification (HCC)   Malnutrition of moderate degree   Acute hypoxic respiratory failure (HCC)   Acute on chronic heart failure with preserved ejection fraction (HFpEF) (HCC)   Achalasia   Dysphagia   PLAN   --Await barium swallow  --Depending on the results of esophagram will make diet recommendations.   If no esophageal stricture suggested on the study then consider outpatient esophageal manometry.  Sounds like he had one before but we are unable to see results  Subjective   Feels okay.  Waiting to go down for barium swallow .  Objective   Imaging:  DG Swallowing Func-Speech Pathology Table formatting from the original result was not included. Images from the original result were not included. Modified Barium Swallow Study  Patient Details  Name: [name] MRN: [id number] Date of Birth: 1960-09-21  Today's Date: 09/11/2023  HPI/PMH: HPI: [name] is a 63 yo male presenting to ED  7/14 with SOB,  cough, malaise, and poor appetite x1 week. CXR showed dense RLL PNA.  Complicated by septic shock with pressor needs and transfer to ICU 7/15.  Transferred back to [facility] 7/20 but with worsening hypoxia on [facility] 7/24. CXR  7/24 shows increased R lung opacity, concerning for worsening R PNA. CCM  re-consulted and CT Chest pending. Thought to have a primary esophageal  dysphagia at the time of previous SLP evaluation 7/15 and had been  scheduled for an OP esophageal dilation 7/28 PTA. PMH includes former EtOH  abuse, tobacco abuse, paroxysmal A-fib, chronic HFpEF, nonischemic  cardiomyopathy, HTN, HLD, COPD, esophageal dysphagia with history of  stricture and dysmotility s/p multiple dilations  Clinical Impression: Clinical Impression: Patient presents with an oropharyngeal swallow that  is [facility] as per this MBS. Swallow initiated at level of vallecular sinus for  liquids and solids. Mild amount of vallecular residuals remained s/p  initial swallows with solids and liquids but cleared with subsequent  swallows. Two instances of penetration, one flash (PAS 2) and one where  penetrate did not immediately clear from laryngeal vestibule (PAS 3)  observed with thin liquids. No instances of aspiration occured with any of  the tested barium consistencies. PES opening appeared [facility]. During  esophageal sweep, appearance of barium stasis observed throughout majority  of esophagus with retrograde flow below PES. SLP informed MD of findings  and will s/o at this time as patient's dysphagia is primarily esophageal  in nature.  Factors that may increase risk of adverse event in presence of  aspiration  Noe & Lianne 2021): Factors that may increase risk of adverse event  in presence of aspiration Noe & Lianne 2021): Frail or deconditioned;  Presence of tubes (ETT, trach, NG, etc.); Poor general health and/or  compromised immunity  Recommendations/Plan: Swallowing Evaluation  Recommendations Swallowing Evaluation Recommendations Recommendations: PO diet PO Diet Recommendation: -- (thin liquids, and any type of solids; recs per  GI) Liquid Administration via: Cup; Straw Postural changes: Position pt fully upright for meals; Stay upright 30-60  min after meals Oral care recommendations: Oral care BID (2x/day)  Treatment Plan Treatment Plan Treatment recommendations: No treatment recommended at this time Follow-up recommendations: No SLP follow up Functional status assessment: Patient has not had a recent decline in  their functional status.  Recommendations Recommendations for follow up therapy are one component of a  multi-disciplinary discharge planning process, led by the attending  physician.  Recommendations may be updated based on patient status,  additional functional criteria and insurance authorization.  Assessment: Orofacial Exam: Orofacial Exam Oral Cavity: Oral Hygiene: WFL Orofacial Anatomy: WFL Oral Motor/Sensory Function: WFL  Anatomy:  Anatomy: Prominent cricopharyngeus  Boluses Administered: Boluses Administered Boluses Administered: Thin liquids (Level 0); Mildly thick liquids (Level  2, nectar thick); Moderately thick liquids (Level 3, honey thick); Puree;  Solid     Oral Impairment Domain: Oral Impairment Domain Lip Closure: No labial escape Tongue control during bolus hold: Not tested Bolus preparation/mastication: Timely and efficient chewing and mashing Bolus transport/lingual motion: Brisk tongue motion Oral residue: Complete oral clearance Location of oral residue : N/A Initiation of pharyngeal swallow : Valleculae     Pharyngeal Impairment Domain: Pharyngeal Impairment Domain Soft palate elevation: No bolus between soft palate (SP)/pharyngeal wall  (PW) Laryngeal elevation: Complete superior movement of thyroid cartilage with  complete approximation of arytenoids to epiglottic petiole Anterior hyoid  excursion: Partial anterior movement Epiglottic movement: Complete inversion Laryngeal vestibule closure: Complete, no air/contrast in laryngeal  vestibule Pharyngeal stripping wave : Present - complete Pharyngeal contraction (A/P view only): N/A Pharyngoesophageal segment opening: Complete distension and complete  duration, no obstruction of flow Tongue base retraction: No contrast between tongue base and posterior  pharyngeal wall (PPW) Pharyngeal residue: Collection of residue within or on pharyngeal  structures Location of pharyngeal residue: Valleculae     Esophageal Impairment Domain: Esophageal Impairment Domain Esophageal clearance upright position: Esophageal retention with  retrograde flow below pharyngoesophageal segment (PES); Esophageal  retention  Pill: Pill Consistency administered: Thin liquids (Level 0) Thin liquids (Level 0): [facility]  Penetration/Aspiration Scale Score: Penetration/Aspiration Scale Score 1.  Material does not enter airway: Mildly thick liquids (Level 2, nectar  thick); Moderately thick liquids (Level 3, honey thick); Puree; Solid;  Pill 2.  Material enters airway, remains ABOVE vocal cords then ejected out:  Thin liquids (Level 0)  Compensatory Strategies: Compensatory Strategies Compensatory strategies: Yes Straw: Effective Effective Straw: Thin liquid (Level 0)      General Information: No data recorded  Diet Prior to this Study: NPO    Temperature : Normal    No data recorded   Supplemental O2: Nasal cannula    History of Recent Intubation: No   Behavior/Cognition: Alert; Cooperative; Pleasant mood  Self-Feeding Abilities: Needs assist with self-feeding  Baseline vocal quality/speech: Normal  Volitional Cough: Able to elicit  Volitional Swallow: Able to elicit  Exam Limitations: No limitations  Goal Planning: Prognosis for improved oropharyngeal function: Good  No data recorded No data recorded Patient/Family  Stated Goal: none stated  Consulted  and agree with results and recommendations: Patient  Pain: Pain Assessment Pain Assessment: No/denies pain Pain Score: 0 Pain Location: back and abdomen Pain Descriptors / Indicators: Discomfort; Sore Pain Intervention(s): Monitored during session; Repositioned; Patient  requesting pain meds-RN notified  End of Session: Start Time:SLP Start Time (ACUTE ONLY): 1545  Stop Time: SLP Stop Time (ACUTE ONLY): 1405  Time Calculation:SLP Time Calculation (min) (ACUTE ONLY): 1340 min  Charges: SLP Evaluations $ SLP Speech Visit: 1 Visit  SLP Evaluations $MBS Swallow: 1 Procedure  SLP visit diagnosis: SLP Visit Diagnosis: Dysphagia, pharyngoesophageal  phase (R13.14)  Past Medical History:  Past Medical History:  Diagnosis Date   Anxiety    Arthritis    Cardiomyopathy (HCC)    CHF (congestive heart failure) (HCC)    COPD (chronic obstructive pulmonary disease) (HCC)    Dysphagia    with esophageal stricture   Dysrhythmia    atrial fib   GERD (gastroesophageal reflux disease)    History of blood transfusion    Hypertension    Past Surgical History:  Past Surgical History:  Procedure Laterality Date   ACETABULAR REVISION Right 05/15/2012   Procedure: ACETABULAR REVISION;  Surgeon: Dempsey LULLA Moan, MD;  Location:  [facility];  Service: Orthopedics;  Laterality: Right;  RIGHT HIP BEARING  SURFACE VS ACETABULAR REVISION    ESOPHAGOGASTRODUODENOSCOPY     with dilitation   EYE SURGERY Right    cataract extraction with IOL   FLEXIBLE BRONCHOSCOPY Bilateral 09/07/2023   Procedure: BRONCHOSCOPY, FLEXIBLE;  Surgeon: Kara Dorn NOVAK, MD;   Location: [facility] ENDOSCOPY;  Service: Pulmonary;  Laterality: Bilateral;   JOINT REPLACEMENT Bilateral    hips/revision right x 4   Norleen IVAR Blase, MA, CCC-SLP Speech Therapy     Scheduled inpatient medications:   amiodarone   200 mg Per Tube Daily   apixaban   5 mg Per Tube BID   arformoterol   15 mcg  Nebulization BID   atorvastatin   40 mg Per Tube Daily   bethanechol   10 mg Oral TID   Chlorhexidine  Gluconate Cloth  6 each Topical Daily   feeding supplement  1 Container Oral TID BM   feeding supplement (PROSource TF20)  60 mL Per Tube Daily   insulin  aspart  0-9 Units Subcutaneous Q4H   multivitamin with minerals  1 tablet Per Tube Daily   mouth rinse  15 mL Mouth Rinse 4 times per day   pantoprazole  (PROTONIX ) IV  40 mg Intravenous Q12H   revefenacin   175 mcg Nebulization Daily   sodium chloride  flush  10-40 mL Intracatheter Q12H   thiamine   100 mg Per Tube Daily   Continuous inpatient infusions:   feeding supplement (OSMOLITE 1.5 CAL) 55 mL/hr at 09/16/23 2351   PRN inpatient medications: diazepam , fluticasone , guaiFENesin -dextromethorphan , hydrOXYzine , ipratropium-albuterol , lip balm, loperamide , melatonin, mouth rinse, oxyCODONE -acetaminophen , phenol, polyethylene glycol, prochlorperazine , sodium chloride   Vital signs in last 24 hours: Temp:  [97.6 F (36.4 C)-98 F (36.7 C)] 97.6 F (36.4 C) (08/04 0732) Pulse Rate:  [64-70] 65 (08/04 0732) Resp:  [17-18] 18 (08/04 0340) BP: (99-112)/(57-68) 110/64 (08/04 0732) SpO2:  [92 %-96 %] 96 % (08/04 0800) Weight:  [78.1 kg] 78.1 kg (08/04 0500) Last BM Date : 09/16/23  Intake/Output Summary (Last 24 hours) at 09/17/2023 1106 Last data filed at 09/17/2023 0000 Gross per 24 hour  Intake 6477 ml  Output 850 ml  Net 5627 ml    Intake/Output from previous day: 08/03 0701 - 08/04 0700 In: 6837 [P.O.:540; I.V.:70; NG/GT:6167]  Out: 1250 [Urine:1250] Intake/Output this shift: No intake/output data recorded.   Physical Exam:  General: Alert male in NAD Heart:  Regular rate and rhythm.  Pulmonary: Normal respiratory effort Abdomen: Soft, nondistended, nontender. Normal bowel sounds. Extremities: No lower extremity edema  Neurologic: Alert and oriented Psych: Pleasant. Cooperative     LOS: 21 days   Vina Dasen ,NP  09/17/2023, 11:06 AM

## 2023-09-17 NOTE — Progress Notes (Signed)
 Physical Therapy Treatment Patient Details Name: [name] MRN: [id number] DOB: 1961-02-07 Today's Date: 09/17/2023   History of Present Illness Pt is a 63 yr old male who presented 08/27/23 due to SOB, decrease in appetite. Pt the went into septic shock and was transferred into ICU and transferred out on 09/02/23.  Increased O2 requirement on 7/25 and noted chest CT with cavitation of R UL suggestive of necrosis so pt underwent bronchoscopy and transferred back to ICU with HHFNC to 35L 94% FiO2. PMH: HTN, COPD, PAF on eliquis , NICM, HFpEF    PT Comments  Pt is agreeable to working with PT. Pt continues to be frustrated with  inability to have his esophageal dilation procedure done during his hospitalization as he feels that it will be helpful in improving his condition. PT sent chat of concerns to his Hospitalist. Pt is contact guard for bed mobility and min Ax2 for coming to standing. Pt able to stand for ~3 min when HR climbed to 140s and SpO2 on 5L O2 dropped to 87%O2. Pt sat and practiced pursed lip breathing. Pt able stand a second time and take lateral steps towards the HoB with minA before returning to supine. Pt reports that he does not want to sit up in recliner but will continue to sit on EoB when his meals come to eat. D/c plans remain appropriate. PT will continue to follow acutely.     If plan is discharge home, recommend the following: Help with stairs or ramp for entrance;Assistance with cooking/housework;Assist for transportation;A lot of help with walking and/or transfers;A lot of help with bathing/dressing/bathroom   Can travel by private vehicle     No  Equipment Recommendations  Other (comment) (TBA)       Precautions / Restrictions Precautions Precautions: Fall Precaution/Restrictions Comments: watch SpO2 and BP; cortrak Restrictions Weight Bearing Restrictions Per Provider Order: No     Mobility  Bed Mobility Overal bed mobility: Needs Assistance Bed Mobility:  Supine to Sit, Sit to Supine     Supine to sit: Contact guard, HOB elevated, Used rails Sit to supine: Contact guard assist, HOB elevated, Used rails   General bed mobility comments: CGA for safety with bed mobility with pt relying on bed rails with HOB elevated.    Transfers Overall transfer level: Needs assistance Equipment used: Rolling walker (2 wheels) Transfers: Sit to/from Stand, Bed to chair/wheelchair/BSC Sit to Stand: Min assist          Lateral/Scoot Transfers: Contact guard assist General transfer comment: min Ax2 for coming to standing at EOB, pt bed pad soiled and pt stood with PT until MS came back with clean pad. Pt HR in 140s and had to sit back down due to fatigue. Pt stands back up and bed pad replaced HR in 120s. Pt requesting to return to bed. Pt takes lateral steps towards the head of bed with contact guard assist    Ambulation/Gait               General Gait Details: deferred due to frequent elevated HR in standing      Balance Overall balance assessment: Needs assistance Sitting-balance support: No upper extremity supported, Feet supported Sitting balance-[name]: Fair Sitting balance - Comments: sits EOB several minutes and then on commode ~20 min with intermittent UE support without LOB   Standing balance support: Bilateral upper extremity supported, During functional activity Standing balance-[name]: Poor Standing balance comment: reliant on UE support and external physical assistance  Communication Communication Communication: No apparent difficulties  Cognition Arousal: Alert Behavior During Therapy: Flat affect   PT - Cognitive impairments: No apparent impairments                       PT - Cognition Comments: continues to be frustrated by not feeling well, continue to educate on benefits of sitting up for GI and respiratory systems. Pt reports he sits on EoB for meals, but does  not want to get up to recliner because it is too uncomfortable Following commands: Intact      Cueing Cueing Techniques: Verbal cues  Exercises Other Exercises Other Exercises: marching in place x10    General Comments General comments (skin integrity, edema, etc.): Pt on 5L O2 via [place]on SpO2 in standing drops to 87 %O2 instructions on purse lip breathing however pt with increased mouth breathing      Pertinent Vitals/Pain Pain Assessment Pain Assessment: Faces Faces Pain Scale: Hurts little more Pain Location: stomach, buttocks with diarrhea Pain Descriptors / Indicators: Discomfort, Grimacing, Sore Pain Intervention(s): Limited activity within patient's tolerance, Monitored during session, Repositioned     PT Goals (current goals can now be found in the care plan section) Acute Rehab PT Goals Patient Stated Goal: to go home PT Goal Formulation: With patient Time For Goal Achievement: 09/19/23 Potential to Achieve Goals: Fair Progress towards PT goals: Progressing toward goals    Frequency    Min 2X/week      PT Plan         AM-PAC PT 6 Clicks Mobility   Outcome Measure  Help needed turning from your back to your side while in a flat bed without using bedrails?: A Little Help needed moving from lying on your back to sitting on the side of a flat bed without using bedrails?: A Little Help needed moving to and from a bed to a chair (including a wheelchair)?: A Lot Help needed standing up from a chair using your arms (e.g., wheelchair or bedside chair)?: A Lot Help needed to walk in hospital room?: Total Help needed climbing 3-5 steps with a railing? : Total 6 Click Score: 12    End of Session Equipment Utilized During Treatment: Gait belt Activity Tolerance: Patient limited by pain;Treatment limited secondary to medical complications (Comment);Other (comment) (SpO2 dropping) Patient left: in bed;with call bell/phone within reach;with bed alarm set Nurse  Communication: Mobility status;Other (comment) (pt requesting to speak with his hospitalist, PT sent chat to MD) PT Visit Diagnosis: Muscle weakness (generalized) (M62.81);Other abnormalities of gait and mobility (R26.89);Unsteadiness on feet (R26.81);Difficulty in walking, not elsewhere classified (R26.2)     Time: 1539-1600 PT Time Calculation (min) (ACUTE ONLY): 21 min  Charges:    $Therapeutic Activity: 8-22 mins PT General Charges $$ ACUTE PT VISIT: 1 Visit                     Magaline Steinberg B. Fleeta Lapidus PT, DPT Acute Rehabilitation Services Please use secure chat or  Call Office [phone]    [name] Fleet 09/17/2023, 4:20 PM

## 2023-09-17 NOTE — TOC Progression Note (Signed)
 Transition of Care [facility]) - Progression Note    Patient Details  Name: [name] MRN: [id number] Date of Birth: 05/28/1960  Transition of Care [facility]) CM/SW Contact  Waldron Gerry A [place], LCSW Phone Number: 09/17/2023, 4:43 PM  Clinical Narrative:     CSW met with pt at bedside to offer updated bed offers. He said that I'm not going to no home, I'm going home home.  CSW explained that given pt's disposition going to rehab would be a better option for pt. He said that his neighbor and his wife would help pt at home. CSW left bed offers for consideration, at this time, pt declining SNF recommendation.  RNCM notified and MD notified.   CSW will continue to follow.   Expected Discharge Plan: Home w Home Health Services Barriers to Discharge: Continued Medical Work up               Expected Discharge Plan and Services In-house Referral: NA Discharge Planning Services: CM Consult Post Acute Care Choice: Durable Medical Equipment, Home Health Living arrangements for the past 2 months: Mobile Home                   DME Agency: NA       HH Arranged: PT, OT HH Agency: [facility] Date [facility] Agency Contacted: 09/06/23 Time HH Agency Contacted: 1414 Representative spoke with at [facility] Agency: Amy   Social Drivers of Health (SDOH) Interventions SDOH Screenings   Food Insecurity: No Food Insecurity (08/28/2023)  Housing: Low Risk  (08/28/2023)  Transportation Needs: No Transportation Needs (08/28/2023)  Utilities: Not At Risk (08/28/2023)  Tobacco Use: High Risk (09/07/2023)    Readmission Risk Interventions    08/30/2023    1:18 PM  Readmission Risk Prevention Plan  Transportation Screening Complete  HRI or Home Care Consult Complete  Social Work Consult for Recovery Care Planning/Counseling Complete  Palliative Care Screening Not Applicable  Medication Review Oceanographer) Referral to Pharmacy

## 2023-09-17 NOTE — [place] FL2 (Signed)
   MEDICAID FL2 LEVEL OF CARE FORM     IDENTIFICATION  Patient Name: [name] Birthdate: February 24, 1960 Sex: male Admission Date (Current Location): 08/27/2023  [facility] and [place] Number:  [employer] and Address:  [facility] [place], [address] [place], [place] [address]      Provider Number: [id number]  Attending Physician Name and Address:  Darci Pore, MD  Relative Name and Phone Number:  Donzetta Service (Sister)  [phone]    Current Level of Care: Hospital Recommended Level of Care: Skilled Nursing Facility Prior Approval Number:    Date Approved/Denied:   PASRR Number: [id number]  Discharge Plan: SNF    Current Diagnoses: Patient Active Problem List   Diagnosis Date Noted   Malnutrition of moderate degree 09/12/2023   Acute hypoxic respiratory failure (HCC) 09/12/2023   Acute on chronic heart failure with preserved ejection fraction (HFpEF) (HCC) 09/12/2023   Achalasia 09/12/2023   Dysphagia 09/12/2023   Septic shock (HCC) 08/27/2023   Esophageal dysphagia 04/10/2018   History of colonic diverticulitis 04/10/2018   Ingrowing toenail of left foot 02/28/2018   Pain around toenail, left foot 02/28/2018   Cigarette smoker 11/13/2017   Stage 3 severe COPD by GOLD classification (HCC) 11/13/2017   Dilated cardiomyopathy (HCC) 11/06/2016   Current use of long term anticoagulation 11/06/2016   Dyslipidemia 11/06/2016   On amiodarone  therapy 11/06/2016   CAD in native artery 08/02/2016   DOE (dyspnea on exertion) 08/02/2016   Other specified postprocedural states 11/11/2015   Paroxysmal atrial flutter (HCC) 11/11/2015   Acute renal failure (HCC) 10/22/2015   Esophageal reflux 10/22/2015   Acute on chronic systolic heart failure (HCC) 08/31/2014   Paroxysmal atrial fibrillation (HCC) 08/31/2014   Tobacco use disorder 08/31/2014   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    Orientation RESPIRATION  BLADDER Height & Weight     Self, Time, Situation, Place  O2 (5L) External catheter, Continent Weight: 172 lb 2.9 oz (78.1 kg) Height:  5' 9 (175.3 cm)  BEHAVIORAL SYMPTOMS/MOOD NEUROLOGICAL BOWEL NUTRITION STATUS      Continent Diet (see DC summary)  AMBULATORY STATUS COMMUNICATION OF NEEDS Skin   Extensive Assist Verbally Normal                       Personal Care Assistance Level of Assistance  Bathing, Feeding, Dressing Bathing Assistance: Maximum assistance Feeding assistance: Limited assistance Dressing Assistance: Maximum assistance     Functional Limitations Info  Sight, Hearing, Speech Sight Info: Impaired Hearing Info: Adequate Speech Info: Adequate    SPECIAL CARE FACTORS FREQUENCY  PT (By licensed PT), OT (By licensed OT)     PT Frequency: 5x/week OT Frequency: 5x/week            Contractures Contractures Info: Not present    Additional Factors Info  Code Status, Allergies Code Status Info: FULL Allergies Info: Codeine           Current Medications (09/17/2023):  This is the current hospital active medication list Current Facility-Administered Medications  Medication Dose Route Frequency Provider Last Rate Last Admin   amiodarone  (PACERONE ) tablet 200 mg  200 mg Per Tube Daily Dewald, Jonathan B, MD   200 mg at 09/17/23 0844   apixaban  (ELIQUIS ) tablet 5 mg  5 mg Per Tube BID Dewald, Jonathan B, MD   5 mg at 09/17/23 0845   arformoterol  (BROVANA ) nebulizer solution 15 mcg  15 mcg Nebulization BID Smith, Daniel C,  MD   15 mcg at 09/17/23 0800   atorvastatin  (LIPITOR) tablet 40 mg  40 mg Per Tube Daily Dewald, Jonathan B, MD   40 mg at 09/17/23 0844   bethanechol  (URECHOLINE ) tablet 10 mg  10 mg Oral TID Olalere, Adewale A, MD   10 mg at 09/17/23 0844   Chlorhexidine  Gluconate Cloth 2 % PADS 6 each  6 each Topical Daily Jude Harden GAILS, MD   6 each at 09/16/23 2108   diazepam  (VALIUM ) tablet 5 mg  5 mg Per Tube Q12H PRN Bowser, Grace E, NP   5 mg at  09/17/23 1101   feeding supplement (BOOST / RESOURCE BREEZE) liquid 1 Container  1 Container Oral TID BM Esterwood, Amy S, PA-C   1 Container at 09/17/23 1353   feeding supplement (OSMOLITE 1.5 CAL) liquid 1,000 mL  1,000 mL Per Tube Continuous Dewald, Jonathan B, MD 55 mL/hr at 09/16/23 2351 Infusion Verify at 09/16/23 2351   feeding supplement (PROSource TF20) liquid 60 mL  60 mL Per Tube Daily Dewald, Jonathan B, MD   60 mL at 09/17/23 0844   fluticasone  (FLONASE ) 50 MCG/ACT nasal spray 2 spray  2 spray Each Nare Daily PRN Claudene Toribio BROCKS, MD       guaiFENesin -dextromethorphan  (ROBITUSSIN DM) 100-10 MG/5ML syrup 5 mL  5 mL Per Tube Q4H PRN Kara Dorn NOVAK, MD   5 mL at 09/13/23 2033   hydrOXYzine  (ATARAX ) tablet 25 mg  25 mg Per Tube TID PRN Bowser, Grace E, NP   25 mg at 09/16/23 2113   insulin  aspart (novoLOG ) injection 0-9 Units  0-9 Units Subcutaneous Q4H Olalere, Adewale A, MD   1 Units at 09/14/23 1237   ipratropium-albuterol  (DUONEB) 0.5-2.5 (3) MG/3ML nebulizer solution 3 mL  3 mL Nebulization Q4H PRN Olalere, Adewale A, MD       lip balm (CARMEX) ointment   Topical PRN Kara Dorn NOVAK, MD   Given at 09/08/23 0504   loperamide  (IMODIUM ) capsule 2 mg  2 mg Oral PRN Sreeram, Narendranath, MD   2 mg at 09/15/23 2101   melatonin tablet 5 mg  5 mg Per Tube QHS PRN Dewald, Jonathan B, MD   5 mg at 09/12/23 2132   multivitamin with minerals tablet 1 tablet  1 tablet Per Tube Daily Dewald, Jonathan B, MD   1 tablet at 09/17/23 0845   Oral care mouth rinse  15 mL Mouth Rinse 4 times per day Kara Dorn NOVAK, MD   15 mL at 09/17/23 1236   Oral care mouth rinse  15 mL Mouth Rinse PRN Kara Dorn NOVAK, MD       oxyCODONE -acetaminophen  (PERCOCET/ROXICET) 5-325 MG per tablet 1 tablet  1 tablet Per Tube Q6H PRN Kara Dorn NOVAK, MD   1 tablet at 09/17/23 0848   pantoprazole  (PROTONIX ) injection 40 mg  40 mg Intravenous Q12H Kara Dorn NOVAK, MD   40 mg at 09/17/23 0845   phenol  (CHLORASEPTIC) mouth spray 1 spray  1 spray Mouth/Throat PRN Kamat, Sunil G, MD       polyethylene glycol (MIRALAX  / GLYCOLAX ) packet 17 g  17 g Per Tube Daily PRN Kara Dorn NOVAK, MD       prochlorperazine  (COMPAZINE ) injection 5 mg  5 mg Intravenous Q6H PRN Shona Laurence N, DO       revefenacin  (YUPELRI ) nebulizer solution 175 mcg  175 mcg Nebulization Daily Claudene Toribio BROCKS, MD   175 mcg at 09/17/23 0759  sodium chloride  (OCEAN) 0.65 % nasal spray 1 spray  1 spray Each Nare PRN Vernon Ranks, MD   1 spray at 09/03/23 0456   sodium chloride  flush (NS) 0.9 % injection 10-40 mL  10-40 mL Intracatheter Q12H Claudene Toribio BROCKS, MD   10 mL at 09/17/23 9153   thiamine  (VITAMIN B1) tablet 100 mg  100 mg Per Tube Daily Dewald, Jonathan B, MD   100 mg at 09/17/23 0845     Discharge Medications: Please see discharge summary for a list of discharge medications.  Relevant Imaging Results:  Relevant Lab Results:   Additional Information SSN:[id number]  Tyreque Finken A [place], LCSW

## 2023-09-18 ENCOUNTER — Inpatient Hospital Stay (HOSPITAL_COMMUNITY)

## 2023-09-18 DIAGNOSIS — K220 Achalasia of cardia: Secondary | ICD-10-CM | POA: Diagnosis not present

## 2023-09-18 DIAGNOSIS — K224 Dyskinesia of esophagus: Secondary | ICD-10-CM

## 2023-09-18 DIAGNOSIS — A419 Sepsis, unspecified organism: Secondary | ICD-10-CM | POA: Diagnosis not present

## 2023-09-18 DIAGNOSIS — E440 Moderate protein-calorie malnutrition: Secondary | ICD-10-CM | POA: Diagnosis not present

## 2023-09-18 DIAGNOSIS — E43 Unspecified severe protein-calorie malnutrition: Secondary | ICD-10-CM | POA: Insufficient documentation

## 2023-09-18 DIAGNOSIS — R933 Abnormal findings on diagnostic imaging of other parts of digestive tract: Secondary | ICD-10-CM

## 2023-09-18 DIAGNOSIS — J9601 Acute respiratory failure with hypoxia: Secondary | ICD-10-CM | POA: Diagnosis not present

## 2023-09-18 LAB — GLUCOSE, CAPILLARY
Glucose-Capillary: 103 mg/dL — ABNORMAL HIGH (ref 70–99)
Glucose-Capillary: 109 mg/dL — ABNORMAL HIGH (ref 70–99)
Glucose-Capillary: 112 mg/dL — ABNORMAL HIGH (ref 70–99)
Glucose-Capillary: 115 mg/dL — ABNORMAL HIGH (ref 70–99)
Glucose-Capillary: 122 mg/dL — ABNORMAL HIGH (ref 70–99)
Glucose-Capillary: 163 mg/dL — ABNORMAL HIGH (ref 70–99)

## 2023-09-18 MED ORDER — ENSURE PLUS HIGH PROTEIN PO LIQD
237.0000 mL | Freq: Three times a day (TID) | ORAL | Status: DC
  Administered 2023-09-19 – 2023-09-26 (×26): 237 mL via ORAL

## 2023-09-18 MED ORDER — FUROSEMIDE 20 MG PO TABS
20.0000 mg | ORAL_TABLET | Freq: Every day | ORAL | Status: DC
  Administered 2023-09-18 – 2023-09-21 (×3): 20 mg via ORAL
  Filled 2023-09-18 (×3): qty 1

## 2023-09-18 MED ORDER — SODIUM CHLORIDE 0.9 % IV SOLN: INTRAVENOUS | Status: DC

## 2023-09-18 MED ORDER — PREDNISONE 20 MG PO TABS
40.0000 mg | ORAL_TABLET | Freq: Every day | ORAL | Status: AC
  Administered 2023-09-18 – 2023-09-20 (×2): 40 mg via ORAL
  Filled 2023-09-18 (×2): qty 2

## 2023-09-18 NOTE — Progress Notes (Signed)
 Daily Progress Note  DOA: 08/27/2023 Hospital Day: 42   Patient Profile:   63 year old male with a medical history including but not limited to COPD, PAF on Eliquis , NICM, CKD 3.  Admitted with acute heart failure, acute hypoxic respiratory failure, sepsis , pneumonia.  Patient gave a history of dysphagia, see 09/16/2023 GI consult note    ASSESSMENT    Chronic intermittent dysphagia Esophageal dysmotility Narrowing of very distal segment of esophaus on esophagram.  History of esophageal stricture in 2019.  Esophagram shows relative narrowing of the very distal mild segment of the esophagus with persistent oval smoothly bordered filling defect within the distal esophagus just proximal to this narrowing. Poor esophageal motility    Sepsis, resolved.   Acute hypoxic respiratory failure ? COPD exacerbation Acute HFpEF Responded to IV diuretics. )2 sat 92% on 4 L 02 per [place]   AFIB Eliquis  now on hold for EGD   Principal Problem:   Septic shock (HCC) Active Problems:   Esophageal dysphagia   Paroxysmal atrial fibrillation (HCC)   Stage 3 severe COPD by GOLD classification (HCC)   Malnutrition of moderate degree   Acute hypoxic respiratory failure (HCC)   Acute on chronic heart failure with preserved ejection fraction (HFpEF) (HCC)   Achalasia   Dysphagia   PLAN   --Scheduled for EGD to be done tomorrow. The risks and benefits of EGD with possible biopsies were discussed with the patient who agrees to proceed.  --Has Cortrak for now. May be able to remove and leave out following EGD.  --Off Eliquis , last dose was this am.   Subjective   No complaints today.   Having BMs  Objective   GI Studies:    Imaging:  DG ESOPHAGUS W SINGLE CM (SOL OR THIN BA) CLINICAL DATA:  Chronic dysphagia. History of esophageal stricture in 2019. Suspected history of achalasia, although unable to confirm the chart review. IR consulted for esophagram for further evaluation and to  rule out achalasia or other esophageal abnormality.  EXAM: ESOPHAGUS/BARIUM SWALLOW/TABLET STUDY  TECHNIQUE: Single contrast examination was performed using thin liquid barium. Limited study due to poor patient mobility and poor oral intake secondary to inadequate passage of barium through the esophagus. This exam was performed by Kimble Clas, PA-C, and was supervised and interpreted by Dr. Jearld.  FLUOROSCOPY: Radiation Exposure Index (as provided by the fluoroscopic device): 7.30 mGy Kerma  COMPARISON:  None Available.  FINDINGS: Swallowing: Grossly unremarkable although not completely evaluated due to patient's physical condition.  Pharynx: Grossly unremarkable although not completely evaluated due to patient's physical condition.  Esophagus: Relative narrowing of mild segment of the very distal esophagus. Persistent smooth oval filling defect over the distal esophagus immediately proximal to the relative narrowing as cannot exclude a mass in this location.  Esophageal motility: Poor esophageal motility with significant tertiary contractions.  Hiatal Hernia: Not present.  Gastroesophageal reflux: Not evaluated.  Ingested 13 mm barium tablet: Not given.  Other: None.  IMPRESSION: Relative narrowing of the very distal mild segment of the esophagus with persistent oval smoothly bordered filling defect within the distal esophagus just proximal to this narrowing. Recommend further evaluation with direct visualization.  Electronically Signed   By: Toribio Jearld M.D.   On: 09/17/2023 16:32     Scheduled inpatient medications:   amiodarone   200 mg Per Tube Daily   arformoterol   15 mcg Nebulization BID   atorvastatin   40 mg Per Tube Daily   bethanechol   10 mg  Oral TID   Chlorhexidine  Gluconate Cloth  6 each Topical Daily   feeding supplement  1 Container Oral TID BM   feeding supplement (PROSource TF20)  60 mL Per Tube Daily   furosemide   20 mg Oral Daily    insulin  aspart  0-9 Units Subcutaneous Q4H   multivitamin with minerals  1 tablet Per Tube Daily   mouth rinse  15 mL Mouth Rinse 4 times per day   pantoprazole  (PROTONIX ) IV  40 mg Intravenous Q12H   predniSONE   40 mg Oral Q breakfast   revefenacin   175 mcg Nebulization Daily   sodium chloride  flush  10-40 mL Intracatheter Q12H   thiamine   100 mg Per Tube Daily   Continuous inpatient infusions:   feeding supplement (OSMOLITE 1.5 CAL) 55 mL/hr at 09/18/23 0658   PRN inpatient medications: diazepam , fluticasone , guaiFENesin -dextromethorphan , hydrOXYzine , ipratropium-albuterol , lip balm, loperamide , melatonin, mouth rinse, oxyCODONE -acetaminophen , phenol, polyethylene glycol, prochlorperazine , sodium chloride   Vital signs in last 24 hours: Temp:  [97.5 F (36.4 C)-98.6 F (37 C)] 97.7 F (36.5 C) (08/05 1148) Pulse Rate:  [62-100] 66 (08/05 1148) Resp:  [17-20] 20 (08/05 1148) BP: (94-108)/(58-70) 97/63 (08/05 1148) SpO2:  [91 %-100 %] 92 % (08/05 1148) Weight:  [79.7 kg] 79.7 kg (08/05 0500) Last BM Date : 09/16/23  Intake/Output Summary (Last 24 hours) at 09/18/2023 1218 Last data filed at 09/18/2023 0658 Gross per 24 hour  Intake 3237.92 ml  Output 3700 ml  Net -462.08 ml    Intake/Output from previous day: 08/04 0701 - 08/05 0700 In: 3477.9 [P.O.:240; NG/GT:3237.9] Out: 3700 [Urine:3700] Intake/Output this shift: No intake/output data recorded.   Physical Exam:  General: Alert male in NAD Heart:  Regular rate and rhythm.  Pulmonary: Normal respiratory effort on 02 per [place].  Decreased breath sounds bilaterally Abdomen: Soft, nondistended, nontender. Normal bowel sounds. Extremities: No lower extremity edema  Neurologic: Alert and oriented Psych: Pleasant. Cooperative     LOS: 22 days   Vina Dasen ,NP 09/18/2023, 12:18 PM

## 2023-09-18 NOTE — Plan of Care (Signed)

## 2023-09-18 NOTE — Progress Notes (Signed)
 Occupational Therapy Treatment Patient Details Name: [name] MRN: [id number] DOB: 14-Oct-1960 Today's Date: 09/18/2023   History of present illness Pt is a 63 yr old male who presented 08/27/23 due to SOB, decrease in appetite. Pt the went into septic shock and was transferred into ICU and transferred out on 09/02/23.  Increased O2 requirement on 7/25 and noted chest CT with cavitation of R UL suggestive of necrosis so pt underwent bronchoscopy and transferred back to ICU with HHFNC to 35L 94% FiO2. PMH: HTN, COPD, PAF on eliquis , NICM, HFpEF   OT comments  Patient received in supine and agreeable to OT treatment. Patient able to get to EOB with CGA and maintain sitting balance with supervision. Patient performed transfer training with RW to simulate BSC transfers with min assist to power up and for stability during transfers. Patient able to perform grooming and gown change seated and declined further self care tasks. Patient will benefit from continued inpatient follow up therapy, <3 hours/day but declines SNF and states he would like to go home. Patient states he has a friend who can stay with him and assist.  HHOT recommended if patient returns home with 24/7 assistance. Acute OT to continue to follow to address established goals to facilitate DC to next venue of care.        If plan is discharge home, recommend the following:  A lot of help with walking and/or transfers;A lot of help with bathing/dressing/bathroom;Assistance with cooking/housework;Assist for transportation;Help with stairs or ramp for entrance   Equipment Recommendations  BSC/3in1    Recommendations for Other Services      Precautions / Restrictions Precautions Precautions: Fall Precaution/Restrictions Comments: watch SpO2 and BP; cortrak Restrictions Weight Bearing Restrictions Per Provider Order: No       Mobility Bed Mobility Overal bed mobility: Needs Assistance Bed Mobility: Supine to Sit, Sit to Supine      Supine to sit: Contact guard, HOB elevated, Used rails Sit to supine: Contact guard assist, HOB elevated, Used rails   General bed mobility comments: CGA with use of bed rails    Transfers Overall transfer level: Needs assistance Equipment used: Rolling walker (2 wheels) Transfers: Sit to/from Stand, Bed to chair/wheelchair/BSC Sit to Stand: Min assist     Step pivot transfers: Min assist     General transfer comment: transfer training performed to address toilet transfers with min assist to power up and for transfers     Balance Overall balance assessment: Needs assistance Sitting-balance support: No upper extremity supported, Feet supported Sitting balance-[name]: Fair Sitting balance - Comments: supervision EOB   Standing balance support: Bilateral upper extremity supported, During functional activity Standing balance-[name]: Poor Standing balance comment: reliant on UE support and external physical assistance                           ADL either performed or assessed with clinical judgement   ADL Overall ADL's : Needs assistance/impaired     Grooming: Wash/dry hands;Wash/dry face;Brushing hair;Set up;Sitting Grooming Details (indicate cue type and reason): on EOB         Upper Body Dressing : Set up;Sitting Upper Body Dressing Details (indicate cue type and reason): change gown     Toilet Transfer: Minimal assistance;Rolling walker (2 wheels) Toilet Transfer Details (indicate cue type and reason): simulated           General ADL Comments: patient stating he was feeling better but declined further self  care tasks than grooming and gown change    Extremity/Trunk Assessment              Vision       Perception     Praxis     Communication Communication Communication: No apparent difficulties   Cognition Arousal: Alert Behavior During Therapy: Flat affect Cognition: Cognition impaired             OT - Cognition  Comments: alert and oriented x4                 Following commands: Intact        Cueing   Cueing Techniques: Verbal cues  Exercises      Shoulder Instructions       General Comments 4L O2 with 90-93 SpO2 in supine and 88% while seated on EOB and dropping to low 80's with mobility    Pertinent Vitals/ Pain       Pain Assessment Pain Assessment: Faces Faces Pain Scale: Hurts a little bit Pain Location: stomach Pain Descriptors / Indicators: Discomfort, Grimacing Pain Intervention(s): Monitored during session, Repositioned  Home Living                                          Prior Functioning/Environment              Frequency  Min 2X/week        Progress Toward Goals  OT Goals(current goals can now be found in the care plan section)  Progress towards OT goals: Progressing toward goals  Acute Rehab OT Goals Patient Stated Goal: to go home OT Goal Formulation: With patient Time For Goal Achievement: 09/18/23 Potential to Achieve Goals: Good ADL Goals Pt Will Perform Upper Body Bathing: Independently;sitting Pt Will Perform Lower Body Bathing: with modified independence;with adaptive equipment Pt Will Perform Upper Body Dressing: with modified independence;sitting Pt Will Perform Lower Body Dressing: with modified independence;sit to/from stand Pt Will Transfer to Toilet: with modified independence;ambulating  Plan      Co-evaluation                 AM-PAC OT 6 Clicks Daily Activity     Outcome Measure   Help from another person eating meals?: A Little Help from another person taking care of personal grooming?: A Little Help from another person toileting, which includes using toliet, bedpan, or urinal?: A Lot Help from another person bathing (including washing, rinsing, drying)?: A Lot Help from another person to put on and taking off regular upper body clothing?: A Little Help from another person to put on and  taking off regular lower body clothing?: A Lot 6 Click Score: 15    End of Session Equipment Utilized During Treatment: Gait belt;Rolling walker (2 wheels)  OT Visit Diagnosis: Unsteadiness on feet (R26.81);Other abnormalities of gait and mobility (R26.89);Repeated falls (R29.6);Muscle weakness (generalized) (M62.81);History of falling (Z91.81);Pain Pain - part of body:  (stomach)   Activity Tolerance Patient tolerated treatment well   Patient Left in bed;with call bell/phone within reach   Nurse Communication Mobility status        Time: 8988-8962 OT Time Calculation (min): 26 min  Charges: OT General Charges $OT Visit: 1 Visit OT Treatments $Self Care/Home Management : 23-37 mins  [name], OTA Acute Rehabilitation Services  Office [phone]   [name] 09/18/2023, 1:03 PM

## 2023-09-18 NOTE — TOC Progression Note (Signed)
 Transition of Care [facility]) - Progression Note    Patient Details  Name: [name] MRN: [id number] Date of Birth: 1961/02/01  Transition of Care [facility]) CM/SW Contact  Rosaline JONELLE Joe, RN Phone Number: 09/18/2023, 11:27 AM  Clinical Narrative:    CM with IP Care management met with the patient at the bedside to discuss IP Care needs.  The patient refused SNF placement and plans to return home alone with sister/friend to help at the home.  I called and spoke with the patient's sister, [name] by phone and she states that she knew that patient would refused SNF placement.  She states that her and her husband would continue to check on him daily at the home and patient has a friend in the neighborhood - [name] that may stay with him or assist him more frequently.  I called [name], CM with [facility] will provide home health services including PT, OT, MSW and ST.  HH orders placed to be co-signed by MD.  Personal Care Services through [place] [facility] will be faxed in closer to patient's discharge.  [facility] MSW can assist the patient with follow up in the community.  DME at the home includes [place] [facility], Rolator and tub seat.  DME 3:1 will be ordered closer to discharge.  Patient will also need a walk test and will likely need home oxygen set up prior to discharge to home.  Patient quit smoking years ago per sister.  Patient continues to have foley and Cortrak and will need to be removed prior to discharge to home.  CM with IP Care management will continue to follow the patient for return to home when medically stable.  Patient will continue to work with PT/OT and mobility tech while patient is inpatient to increase patient's overall strength.   Expected Discharge Plan: Home w Home Health Services Barriers to Discharge: Continued Medical Work up                Expected Discharge Plan and Services In-house Referral: NA Discharge Planning Services: CM Consult Post Acute Care Choice:  Durable Medical Equipment, Home Health Living arrangements for the past 2 months: Mobile Home                   DME Agency: NA       HH Arranged: PT, OT HH Agency: [facility] Date [facility] Agency Contacted: 09/06/23 Time HH Agency Contacted: 1414 Representative spoke with at [facility] Agency: Amy   Social Drivers of Health (SDOH) Interventions SDOH Screenings   Food Insecurity: No Food Insecurity (08/28/2023)  Housing: Low Risk  (08/28/2023)  Transportation Needs: No Transportation Needs (08/28/2023)  Utilities: Not At Risk (08/28/2023)  Tobacco Use: High Risk (09/07/2023)    Readmission Risk Interventions    09/18/2023   11:26 AM 08/30/2023    1:18 PM  Readmission Risk Prevention Plan  Transportation Screening Complete Complete  PCP or Specialist Appt within 5-7 Days Complete   Home Care Screening Complete   Medication Review (RN CM) Complete   HRI or Home Care Consult  Complete  Social Work Consult for Recovery Care Planning/Counseling  Complete  Palliative Care Screening  Not Applicable  Medication Review [facility])  Referral to Pharmacy

## 2023-09-18 NOTE — Progress Notes (Signed)
 Nutrition Follow-up  DOCUMENTATION CODES:   Severe malnutrition in context of chronic illness  INTERVENTION:  -Liberalize to regular menu diet to promote adequate kcal/pro intake by reducing restrictions.  -Pt's PO intake has improved, continue EN until NPO @ midnight tonight for EGD, hold EN tomorrow to assess PO intake.  -Add Ensure Plus High Protein TID to support PO intake of kcal/pro -Continue MVI w/min -Continue Osmolite @ goal rate of 55 ml/hr via Cortrak for now -60ml ProSource TF20 once daily Provides 2060 kcal, 103g protein, free water  daily    NUTRITION DIAGNOSIS:   Severe Malnutrition related to chronic illness, acute illness, dysphagia, poor appetite as evidenced by severe muscle depletion, moderate fat depletion, edema.  Malnutrition now severe  GOAL:   Patient will meet greater than or equal to 90% of their needs  Met with EN  MONITOR:   PO intake, Weight trends, Supplement acceptance, Labs, TF tolerance, Skin  REASON FOR ASSESSMENT:   Consult Enteral/tube feeding initiation and management  ASSESSMENT:   Pt with hx of former alcohol abuse and tobacco abuse, CHF, COPD, HTN, HLD, and atrial fibrillation presented to ED with worsening SOB and poor appetite. Found to be septic on admission related to PNA.  Spoke with pt and nursing in room. Pt denies new concerns. Endorses improved PO intake. Documented meal intake 100% x 1 meal (8/3), 50% x 1 meal (8/4). Pt has lunch in room at time of visit, nursing to help him eat. Since pt's PO intake has improved, continue EN until NPO @ midnight tonight for EGD, hold EN tomorrow to assess PO intake. Last BM 8/3. No noted n/v/c/d since previous review. Pt's documented weight is consistent with admit weight. NFPE re-assessed, pt now meets criteria for [facility]. Pt's weight increased significantly and then decreased back down to ~admission weight. Will f/u in 1-2 days to assess PO intake. Will continue to monitor, RDN  available prn.   Labs BG 98-115 BUN 24 Albumin  2.1 ALT 50 H/H 9.5/29.9  Medications  amiodarone   200 mg Per Tube Daily   arformoterol   15 mcg Nebulization BID   atorvastatin   40 mg Per Tube Daily   bethanechol   10 mg Oral TID   Chlorhexidine  Gluconate Cloth  6 each Topical Daily   feeding supplement  237 mL Oral TID BM   feeding supplement (PROSource TF20)  60 mL Per Tube Daily   furosemide   20 mg Oral Daily   insulin  aspart  0-9 Units Subcutaneous Q4H   multivitamin with minerals  1 tablet Per Tube Daily   mouth rinse  15 mL Mouth Rinse 4 times per day   pantoprazole  (PROTONIX ) IV  40 mg Intravenous Q12H   predniSONE   40 mg Oral Q breakfast   revefenacin   175 mcg Nebulization Daily   sodium chloride  flush  10-40 mL Intracatheter Q12H   thiamine   100 mg Per Tube Daily     NUTRITION - FOCUSED PHYSICAL EXAM:   Flowsheet Row Most Recent Value  Orbital Region Moderate depletion  Upper Arm Region Mild depletion  Thoracic and Lumbar Region Mild depletion  Buccal Region Moderate depletion  Temple Region Severe depletion  Clavicle Bone Region Severe depletion  Clavicle and Acromion Bone Region Severe depletion  Scapular Bone Region Moderate depletion  Dorsal Hand Moderate depletion  Patellar Region Moderate depletion  Anterior Thigh Region Moderate depletion  Posterior Calf Region Moderate depletion  Edema (RD Assessment) Mild  Hair Reviewed  Eyes Reviewed  Mouth Reviewed  Skin Reviewed  Nails Reviewed    Diet Order:   Diet Order             Diet NPO time specified Except for: Sips with Meds  Diet effective midnight           Diet regular Room service appropriate? Yes with Assist; Fluid consistency: Thin  Diet effective now                 EDUCATION NEEDS:   Education needs have been addressed  Skin:  Skin Assessment: Reviewed RN Assessment  Last BM:  8/3  Height:   Ht Readings from Last 1 Encounters:  08/27/23 5' 9 (1.753 m)    Weight:   Wt  Readings from Last 1 Encounters:  09/18/23 79.7 kg    Ideal Body Weight:  72.7 kg  BMI:  Body mass index is 25.95 kg/m.  Estimated Nutritional Needs:   Kcal:  2000-2200 kcal/d  Protein:  100-120 g/d  Fluid:  2-2.2L/d  [employer] Daml-Budig, RDN, LDN Registered Dietitian Nutritionist RD Inpatient Contact Info in [place]

## 2023-09-18 NOTE — Progress Notes (Signed)
 Mobility Specialist: Progress Note   09/18/23 1400  Mobility  Activity Refused and notified nurse if applicable  Mobility Specialist Start Time (ACUTE ONLY) 1400    Pt refused all mobility and declined MS coming back at a later time. Stated he wasn't feeling well and was very frustrated with his appetite and nausea symptoms. Pt was also irritated stating he isn't able to get any rest. Stated he may be willing to try tomorrow but not today. Left in bed, all needs met.   Ileana Lute Mobility Specialist Please contact via SecureChat or Rehab office at [phone]

## 2023-09-18 NOTE — Progress Notes (Signed)
 Progress Note   Patient: [name] FMW:[id number] DOB: 01-25-1961 DOA: 08/27/2023     22 DOS: the patient was seen and examined on 09/18/2023   Brief hospital course: Lyell Clugston 63yo male smoker with hx HTN, COPD, PAF on eliquis , NICM, HFpEF presented 7/14 with 1 week progressive SOB, cough, malaise, poor appetite. On EMS arrival with hypoxic with sats 80% on RA. In ER initially BP was soft with SBP 80's, tachy 130's but responded well to 1L fluids. CXR revealed dense RUL PNA. He was admitted by [facility], started on IV rocephin / azithro. On 7/15 he was still boarding in ER and again having hypotension with SBP 80's and transferred to PCCM, s/p bronch as he required high flow O2. He is transferred to [facility] 7/30. PCCM on board. He is on 5L oxygen, has cortrak for feeding, not eating well. GI plan to do EGD tomorrow.    Assessment and Plan: Septic shock-resolved. Vasopressor medications weaned off.  Blood pressure stable.  Transferred from PCCM to [facility] 7/30.  Finished linezolid  plus meropenem  total 7 days.   Acute hypoxic respiratory failure Continues on 4-5 L nasal cannula while resting.  Continue to wean O2 as tolerated.  Will need home O2 eval prior to dc.   COPD exacerbation Possible contributing etiology to hypoxia.  Continue nebulizers. Started him on oral steroids, taper upon discharge.   Acute HFpEF Responded well to IV diuresis. Resumed oral Lasix  20mg  daily. Caution with low BP.  Repeat chest xray 2 view ordered. Continue to monitor daily weights, strict input and output.  Supplemental O2 as above.   Esophageal Dysphagia History of achalasia in the past.  Modified barium showing retention in the esophagus.  No overt vomiting at this time.  Resumed regular diet. GI recommended barium swallow which showed narrowing of distal esophagus. Plan for EGD tomorrow, Eliquis  held.   Acute urinary retention Has foley catheter. Continue Flomax  0.4 mg daily.  Plan to remove foley and do  voiding trial once he is more mobile with PT.   Paroxysmal atrial fibrillation Continue amiodarone , hold Eliquis  for EGD.  Continue telemetry.   Moderate protein calorie malnutrition Dietary on board.  Currently has cortrak in place receiving tube feeds. Pending EGD and GI follow up.   Physical debilitation, generalized weakness PT/OT advised SNF. He refused rehab wishes to go home with home health. Hope he will get more help with sister and friends. Lives ina small trailed per TOC. Discussed with him about safe discharge plan is rehab facility.       Out of bed to chair. Incentive spirometry. Nursing supportive care. Fall, aspiration precautions. Diet:  Diet Orders (From admission, onward)     Start     Ordered   09/19/23 0001  Diet NPO time specified Except for: Sips with Meds  Diet effective midnight       Question:  Except for  Answer:  Noralyn with Meds   09/18/23 9044   09/17/23 0815  Diet heart healthy/carb modified Room service appropriate? Yes; Fluid consistency: Thin  Diet effective now       Question Answer Comment  Diet-HS Snack? Nothing   Room service appropriate? Yes   Fluid consistency: Thin      09/17/23 0814           DVT prophylaxis:   Level of care: Med-Surg   Code Status: Full Code  Subjective: Patient is seen and examined today morning. He is lying in bed,  on 4L supplemental O2. He is  eating poor. Got out of bed with PT. Tolerating feeds well. Refused to go SNF.   Physical Exam: Vitals:   09/18/23 0003 09/18/23 0404 09/18/23 0500 09/18/23 0718  BP: (!) 107/58 102/67  108/70  Pulse: 62 81  66  Resp: 17 18    Temp: 97.7 F (36.5 C) 98.6 F (37 C)    TempSrc:      SpO2: 95% 93%  96%  Weight:   79.7 kg   Height:        General - Elderly ill Caucasian male, mild respiratory distress HEENT - PERRLA, EOMI, atraumatic head, non tender sinuses. Lung - Clear, basal rales, diffuse rhonchi, no wheezes. Heart - S1, S2 heard, no murmurs, rubs,  trace pedal edema. Abdomen - Soft, non tender, bowel sounds good. Neuro - Alert, awake and oriented, non focal exam. Skin - Warm and dry.  Data Reviewed:      Latest Ref Rng & Units 09/14/2023    1:52 AM 09/13/2023    3:42 AM 09/13/2023   12:24 AM  CBC  WBC 4.0 - 10.5 K/uL 7.1  9.3  10.5   Hemoglobin 13.0 - 17.0 g/dL 9.5  9.6  9.4   Hematocrit 39.0 - 52.0 % 29.9  29.9  29.3   Platelets 150 - 400 K/uL 169  180  184       Latest Ref Rng & Units 09/14/2023    1:52 AM 09/13/2023    3:42 AM 09/13/2023   12:24 AM  BMP  Glucose 70 - 99 mg/dL 897  891  874   BUN 8 - 23 mg/dL 24  22  22    Creatinine 0.61 - 1.24 mg/dL 9.24  9.17  9.12   Sodium 135 - 145 mmol/L 138  136  137   Potassium 3.5 - 5.1 mmol/L 4.4  4.0  3.8   Chloride 98 - 111 mmol/L 104  100  104   CO2 22 - 32 mmol/L 26  30  28    Calcium  8.9 - 10.3 mg/dL 7.9  7.8  7.8    DG ESOPHAGUS W SINGLE CM (SOL OR THIN BA) Result Date: 09/17/2023 CLINICAL DATA:  Chronic dysphagia. History of esophageal stricture in 2019. Suspected history of achalasia, although unable to confirm the chart review. IR consulted for esophagram for further evaluation and to rule out achalasia or other esophageal abnormality. EXAM: ESOPHAGUS/BARIUM SWALLOW/TABLET STUDY TECHNIQUE: Single contrast examination was performed using thin liquid barium. Limited study due to poor patient mobility and poor oral intake secondary to inadequate passage of barium through the esophagus. This exam was performed by Kimble Clas, PA-C, and was supervised and interpreted by Dr. Jearld. FLUOROSCOPY: Radiation Exposure Index (as provided by the fluoroscopic device): 7.30 mGy Kerma COMPARISON:  None Available. FINDINGS: Swallowing: Grossly unremarkable although not completely evaluated due to patient's physical condition. Pharynx: Grossly unremarkable although not completely evaluated due to patient's physical condition. Esophagus: Relative narrowing of mild segment of the very distal  esophagus. Persistent smooth oval filling defect over the distal esophagus immediately proximal to the relative narrowing as cannot exclude a mass in this location. Esophageal motility: Poor esophageal motility with significant tertiary contractions. Hiatal Hernia: Not present. Gastroesophageal reflux: Not evaluated. Ingested 13 mm barium tablet: Not given. Other: None. IMPRESSION: Relative narrowing of the very distal mild segment of the esophagus with persistent oval smoothly bordered filling defect within the distal esophagus just proximal to this narrowing. Recommend further evaluation with direct visualization. Electronically Signed   By: Toribio  Jearld M.D.   On: 09/17/2023 16:32    Family Communication: no family at bedside.  Disposition: Status is: Inpatient Remains inpatient appropriate because: hypoxia, foley removal once more active, GI follow up for EGD.  Planned Discharge Destination: Rehab     Time spent: 45 minutes  Author: Concepcion Riser, MD 09/18/2023 11:40 AM Secure chat 7am to 7pm For on call review [email or web address].

## 2023-09-19 ENCOUNTER — Inpatient Hospital Stay (HOSPITAL_COMMUNITY)

## 2023-09-19 ENCOUNTER — Encounter (HOSPITAL_COMMUNITY): Payer: Self-pay | Admitting: Internal Medicine

## 2023-09-19 ENCOUNTER — Encounter (HOSPITAL_COMMUNITY)
Admission: EM | Disposition: A | Discharge status: Home / Self Care | Payer: Self-pay | Source: Home / Self Care | Attending: Internal Medicine

## 2023-09-19 DIAGNOSIS — I2510 Atherosclerotic heart disease of native coronary artery without angina pectoris: Secondary | ICD-10-CM

## 2023-09-19 DIAGNOSIS — T18128A Food in esophagus causing other injury, initial encounter: Secondary | ICD-10-CM | POA: Diagnosis not present

## 2023-09-19 DIAGNOSIS — K222 Esophageal obstruction: Secondary | ICD-10-CM

## 2023-09-19 DIAGNOSIS — K449 Diaphragmatic hernia without obstruction or gangrene: Secondary | ICD-10-CM

## 2023-09-19 DIAGNOSIS — Z978 Presence of other specified devices: Secondary | ICD-10-CM

## 2023-09-19 DIAGNOSIS — R6521 Severe sepsis with septic shock: Secondary | ICD-10-CM | POA: Diagnosis not present

## 2023-09-19 DIAGNOSIS — A419 Sepsis, unspecified organism: Secondary | ICD-10-CM | POA: Diagnosis not present

## 2023-09-19 DIAGNOSIS — W44F3XA Food entering into or through a natural orifice, initial encounter: Secondary | ICD-10-CM

## 2023-09-19 HISTORY — PX: ESOPHAGOGASTRODUODENOSCOPY: SHX5428

## 2023-09-19 LAB — CBC
HCT: 28.7 % — ABNORMAL LOW (ref 39.0–52.0)
Hemoglobin: 9.1 g/dL — ABNORMAL LOW (ref 13.0–17.0)
MCH: 27 pg (ref 26.0–34.0)
MCHC: 31.7 g/dL (ref 30.0–36.0)
MCV: 85.2 fL (ref 80.0–100.0)
Platelets: 274 K/uL (ref 150–400)
RBC: 3.37 MIL/uL — ABNORMAL LOW (ref 4.22–5.81)
RDW: 20.6 % — ABNORMAL HIGH (ref 11.5–15.5)
WBC: 6.6 K/uL (ref 4.0–10.5)
nRBC: 0 % (ref 0.0–0.2)

## 2023-09-19 LAB — BASIC METABOLIC PANEL WITH GFR
Anion gap: 7 (ref 5–15)
BUN: 20 mg/dL (ref 8–23)
CO2: 28 mmol/L (ref 22–32)
Calcium: 8.3 mg/dL — ABNORMAL LOW (ref 8.9–10.3)
Chloride: 102 mmol/L (ref 98–111)
Creatinine, Ser: 0.65 mg/dL (ref 0.61–1.24)
GFR, Estimated: >60 mL/min (ref >60)
Glucose, Bld: 85 mg/dL (ref 70–99)
Potassium: 4.5 mmol/L (ref 3.5–5.1)
Sodium: 137 mmol/L (ref 135–145)

## 2023-09-19 LAB — GLUCOSE, CAPILLARY
Glucose-Capillary: 114 mg/dL — ABNORMAL HIGH (ref 70–99)
Glucose-Capillary: 133 mg/dL — ABNORMAL HIGH (ref 70–99)
Glucose-Capillary: 150 mg/dL — ABNORMAL HIGH (ref 70–99)
Glucose-Capillary: 63 mg/dL — ABNORMAL LOW (ref 70–99)
Glucose-Capillary: 76 mg/dL (ref 70–99)
Glucose-Capillary: 81 mg/dL (ref 70–99)
Glucose-Capillary: 81 mg/dL (ref 70–99)
Glucose-Capillary: 84 mg/dL (ref 70–99)
Glucose-Capillary: 87 mg/dL (ref 70–99)

## 2023-09-19 LAB — PHOSPHORUS: Phosphorus: 3.9 mg/dL (ref 2.5–4.6)

## 2023-09-19 LAB — MAGNESIUM: Magnesium: 1.8 mg/dL (ref 1.7–2.4)

## 2023-09-19 LAB — FOLATE: Folate: 10 ng/mL (ref >5.9)

## 2023-09-19 MED ORDER — HYDROCOD POLI-CHLORPHE POLI ER 10-8 MG/5ML PO SUER: 5.0000 mL | Freq: Two times a day (BID) | ORAL | Status: DC | PRN

## 2023-09-19 MED ORDER — SODIUM CHLORIDE 0.9 % IV SOLN
INTRAVENOUS | Status: AC | PRN
  Administered 2023-09-19: 500 mL via INTRAVENOUS

## 2023-09-19 MED ORDER — DEXTROSE 50 % IV SOLN
12.5000 g | INTRAVENOUS | Status: AC
  Administered 2023-09-19: 12.5 g via INTRAVENOUS
  Filled 2023-09-19: qty 50

## 2023-09-19 MED ORDER — GUAIFENESIN ER 600 MG PO TB12
600.0000 mg | ORAL_TABLET | Freq: Two times a day (BID) | ORAL | Status: DC
  Administered 2023-09-19 – 2023-09-26 (×19): 600 mg via ORAL
  Filled 2023-09-19 (×14): qty 1

## 2023-09-19 MED ORDER — DEXTROSE-SODIUM CHLORIDE 5-0.9 % IV SOLN: INTRAVENOUS | Status: AC

## 2023-09-19 MED ORDER — PROPOFOL 10 MG/ML IV BOLUS
INTRAVENOUS | Status: DC | PRN
  Administered 2023-09-19: 10 mg via INTRAVENOUS
  Administered 2023-09-19: 20 mg via INTRAVENOUS
  Administered 2023-09-19: 40 mg via INTRAVENOUS

## 2023-09-19 MED ORDER — LIDOCAINE 2% (20 MG/ML) 5 ML SYRINGE
INTRAMUSCULAR | Status: DC | PRN
  Administered 2023-09-19: 50 mg via INTRAVENOUS

## 2023-09-19 MED ORDER — EPHEDRINE SULFATE (PRESSORS) 50 MG/ML IJ SOLN
INTRAMUSCULAR | Status: DC | PRN
Start: 2023-09-19 — End: 2023-09-19
  Administered 2023-09-19 (×2): 7.5 mg via INTRAVENOUS
  Administered 2023-09-19 (×2): 10 mg via INTRAVENOUS

## 2023-09-19 MED ORDER — PROPOFOL 500 MG/50ML IV EMUL
INTRAVENOUS | Status: DC | PRN
  Administered 2023-09-19: 130 ug/kg/min via INTRAVENOUS

## 2023-09-19 MED ORDER — PANTOPRAZOLE SODIUM 40 MG PO TBEC
40.0000 mg | DELAYED_RELEASE_TABLET | Freq: Every day | ORAL | Status: DC
  Administered 2023-09-20 – 2023-09-26 (×10): 40 mg via ORAL
  Filled 2023-09-19 (×7): qty 1

## 2023-09-19 NOTE — Progress Notes (Signed)
 PT Cancellation Note  Patient Details Name: [name] MRN: [id number] DOB: 09/23/1960   Cancelled Treatment:    Reason Eval/Treat Not Completed: Other (comment) Pt declined due to pain; RN notified. Pending EGD.  [name], PT, DPT Acute Rehabilitation Services Office [phone]    [name] 09/19/2023, 11:25 AM

## 2023-09-19 NOTE — Transfer of Care (Signed)
 Immediate Anesthesia Transfer of Care Note  Patient: [name]  Procedure(s) Performed: EGD (ESOPHAGOGASTRODUODENOSCOPY) Balloon dilation wire-guided  Patient Location: Endoscopy Unit  Anesthesia Type:MAC  Level of Consciousness: awake and alert   Airway & Oxygen Therapy: Patient Spontanous Breathing and Patient connected to face mask oxygen  Post-op Assessment: Report given to RN and Post -op Vital signs reviewed and stable  Post vital signs: Reviewed and stable  Last Vitals:  Vitals Value Taken Time  BP 92/51 09/19/23 12:43  Temp    Pulse 70 09/19/23 12:46  Resp 21 09/19/23 12:46  SpO2 100 % 09/19/23 12:46  Vitals shown include unfiled device data.  Last Pain:  Vitals:   09/19/23 1101  TempSrc: Temporal  PainSc: 8       Patients Stated Pain Goal: 0 (09/12/23 0759)  Complications: No notable events documented.

## 2023-09-19 NOTE — Plan of Care (Signed)

## 2023-09-19 NOTE — Op Note (Signed)
 [facility] Patient Name: [name] Procedure Date : 09/19/2023 MRN: [id number] Attending MD: Gordy CHRISTELLA Starch , MD, [id number] Date of Birth: Jan 07, 1961 CSN: [id number] Age: 63 Admit Type: Inpatient Procedure:                Upper GI endoscopy Indications:              Esophageal dysphagia, Abnormal cine-esophagram Providers:                Gordy CHRISTELLA. Starch, MD, Ozell Pouch, Haskel Chris, Technician Referring MD:             [facility] Medicines:                Monitored Anesthesia Care Complications:            No immediate complications. Estimated Blood Loss:     Estimated blood loss was minimal. Procedure:                Pre-Anesthesia Assessment:                           - Prior to the procedure, a History and Physical                            was performed, and patient medications and                            allergies were reviewed. The patient's tolerance of                            previous anesthesia was also reviewed. The risks                            and benefits of the procedure and the sedation                            options and risks were discussed with the patient.                            All questions were answered, and informed consent                            was obtained. Prior Anticoagulants: The patient has                            taken Eliquis  (apixaban ), last dose was 1 day prior                            to procedure. ASA Grade Assessment: III - A patient                            with severe systemic disease. After reviewing the  risks and benefits, the patient was deemed in                            satisfactory condition to undergo the procedure.                           After obtaining informed consent, the endoscope was                            passed under direct vision. Throughout the                            procedure, the patient's  blood pressure, pulse, and                            oxygen saturations were monitored continuously. The                            [id number]-H190 ([id number]) Olympus endoscope was introduced                            through the mouth, and advanced to the second part                            of duodenum. The upper GI endoscopy was                            accomplished without difficulty. The patient                            tolerated the procedure well. Scope In: Scope Out: Findings:      Food was found in the lower third of the esophagus. The scope was able       to be maneuvered around the food impaction revealing a distal esophageal       stricture at GE junction. After this the food was gently advanced with       the endoscope into the stomach. Removal was successful.      A nasoenteric tube was found in the esophagus, stomach with the distal       portion in the second portion of the duodenum. This was dislodged by       scope action and thus was removed completely and discarded.      One benign-appearing, intrinsic moderate (circumferential scarring or       stenosis; an endoscope may pass) stenosis was found 38 cm from the       incisors. This stenosis measured 9 mm (inner diameter) x less than one       cm (in length). The stenosis was traversed. A TTS dilator was passed       through the scope. Dilation with a 12-13.5-15 mm balloon dilator was       performed to 15 mm. The dilation site was examined and showed moderate       mucosal disruption.      A 5 cm hiatal hernia was present.      The gastroesophageal flap valve was visualized endoscopically and  classified as Hill Grade IV (no fold, wide open lumen, hiatal hernia       present).      The entire examined stomach was normal.      The examined duodenum was normal. Impression:               - Food in the lower third of the esophagus. Removal                            was successful.                           -  Nasoenteric tube present with distal end in 2nd                            portion of duodenum. Removed and discarded.                           - Benign-appearing esophageal stenosis at GE                            junction. Dilated to 15 mm.                           - 5 cm hiatal hernia.                           - Gastroesophageal flap valve classified as Hill                            Grade IV (no fold, wide open lumen, hiatal hernia                            present).                           - Normal stomach.                           - Normal examined duodenum. Moderate Sedation:      N/A Recommendation:           - Return patient to hospital ward for ongoing care.                           - Advance diet as tolerated to maximum of soft                            foods. Hopefully post dilation today he will be                            able to tolerate regular, though soft, diet.                           - Continue present medications.                           -  Patient needs to follow-up with his primary                            gastroenterologist, Dr. Lucillie for consideration of                            repeat upper endoscopy in 4 weeks for repeat                            dilation.                           - GI inpatient team will sign off, call if                            questions.                           - Okay to resume Eliquis  this evening. Procedure Code(s):        --- Professional ---                           [phone], Esophagogastroduodenoscopy, flexible,                            transoral; with removal of foreign body(s)                           43249, Esophagogastroduodenoscopy, flexible,                            transoral; with transendoscopic balloon dilation of                            esophagus (less than 30 mm diameter) Diagnosis Code(s):        --- Professional ---                           U81.871J, Food in esophagus causing other injury,                             initial encounter                           K22.2, Esophageal obstruction                           K44.9, Diaphragmatic hernia without obstruction or                            gangrene                           R13.14, Dysphagia, pharyngoesophageal phase                           R93.3, Abnormal findings on diagnostic imaging of  other parts of digestive tract CPT copyright 2022 American Medical Association. All rights reserved. The codes documented in this report are preliminary and upon coder review may  be revised to meet current compliance requirements. Gordy CHRISTELLA Starch, MD 09/19/2023 1:07:53 PM This report has been signed electronically. Number of Addenda: 0

## 2023-09-19 NOTE — Anesthesia Preprocedure Evaluation (Addendum)
 Anesthesia Evaluation  Patient identified by MRN, date of birth, ID band Patient awake    Reviewed: Allergy & Precautions, NPO status , Patient's Chart, lab work & pertinent test results  Airway Mallampati: III  TM Distance: >3 FB Neck ROM: Full    Dental  (+) Dental Advisory Given, Chipped, Missing, Poor Dentition   Pulmonary COPD, Current Smoker   Pulmonary exam normal breath sounds clear to auscultation       Cardiovascular hypertension, Pt. on medications + CAD and +CHF  Normal cardiovascular exam+ dysrhythmias Atrial Fibrillation  Rhythm:Regular Rate:Normal  TTE 2025  1. Left ventricular ejection fraction, by estimation, is 55 to 60%. The  left ventricle has normal function. The left ventricle has no regional  wall motion abnormalities.   2. Right ventricular systolic function is normal. The right ventricular  size is normal. Tricuspid regurgitation signal is inadequate for assessing  PA pressure.   3. Left atrial size was moderately dilated.   4. The mitral valve is normal in structure. No evidence of mitral valve  regurgitation. No evidence of mitral stenosis.   5. The aortic valve is normal in structure. Aortic valve regurgitation is  trivial.     Neuro/Psych  PSYCHIATRIC DISORDERS Anxiety     negative neurological ROS     GI/Hepatic Neg liver ROS,GERD  ,,  Endo/Other  negative endocrine ROS    Renal/GU negative Renal ROS  negative genitourinary   Musculoskeletal  (+) Arthritis ,    Abdominal   Peds  Hematology  (+) Blood dyscrasia (eliquis ), anemia Lab Results      Component                Value               Date                      WBC                      6.6                 09/19/2023                HGB                      9.1 (L)             09/19/2023                HCT                      28.7 (L)            09/19/2023                MCV                      85.2                09/19/2023                 PLT                      274                 09/19/2023              Anesthesia Other Findings 63yo male smoker with  hx HTN, COPD, PAF on eliquis , NICM, HFpEF presented 7/14 with 1 week progressive SOB, cough, malaise, poor appetite found to have COPD exacerbation and acute respiratory failure now on 4-5L [place] and esophageal dysphagia  Reproductive/Obstetrics                              Anesthesia Physical Anesthesia Plan  ASA: 3  Anesthesia Plan: MAC   Post-op Pain Management:    Induction: Intravenous  PONV Risk Score and Plan: Propofol  infusion and Treatment may vary due to age or medical condition  Airway Management Planned: Natural Airway  Additional Equipment:   Intra-op Plan:   Post-operative Plan:   Informed Consent: I have reviewed the patients History and Physical, chart, labs and discussed the procedure including the risks, benefits and alternatives for the proposed anesthesia with the patient or authorized representative who has indicated his/her understanding and acceptance.     Dental advisory given  Plan Discussed with: CRNA  Anesthesia Plan Comments:          Anesthesia Quick Evaluation

## 2023-09-19 NOTE — Progress Notes (Signed)
 2235 Informed/reminded to KEEP NPO for EDG tomorrow.  Understanding verbalized.  0000 NPO status in effect & Tube Feed stopped via pump as prescribed.

## 2023-09-19 NOTE — Progress Notes (Signed)
 Progress Note   Patient: [name] FMW:[id number] DOB: Jan 30, 1961 DOA: 08/27/2023     23 DOS: the patient was seen and examined on 09/19/2023   Brief hospital course: Brynn Reznik 63yo male smoker with hx HTN, COPD, PAF on eliquis , NICM, HFpEF presented 7/14 with 1 week progressive SOB, cough, malaise, poor appetite. On EMS arrival with hypoxic with sats 80% on RA. In ER initially BP was soft with SBP 80's, tachy 130's but responded well to 1L fluids. CXR revealed dense RUL PNA. He was admitted by [facility], started on IV rocephin /azithro. On 7/15 he was still boarding in ER and again having hypotension with SBP 80's and transferred to PCCM, s/p bronch as he required high flow O2. He is transferred to [facility] 7/30. PCCM on board. He is on 5L oxygen, has cortrak for feeding, not eating well.   Assessment and Plan:  # Septic shock Vasopressor medications weaned off.  Blood pressure stable.  Transferred from P[facility]M to [facility] 7/30.  Finished linezolid  plus meropenem  total 7 days.   # Acute hypoxic respiratory failure Continue supplemental O2 inhalation and gradually wean off 8/6 currently on 3 L oxygen via nasal cannula   # COPD exacerbation Possible contributing etiology to hypoxia.  Continue nebulizers. 8/6 Mucinex  600 mg p.o. twice daily Tussionex prn for cough  # Acute HFpEF Likely contributing etiology to patient's hypoxia.  Responding well to IV diuresis.  Caution as his BP lower side.  Continue to monitor urine output.  Supplemental O2 as above.   # Dysphagia History of achalasia in the past.  Modified barium showing retention in the esophagus.  No overt vomiting at this time.  Continues on liquid diet.  Consulted GI recommended barium swallow. He is tolerating regular diet. Pending barium swallow, he will need outpatient GI follow up. GI consulted, s/p EGD:  - Food in the lower third of the esophagus. Removal was successful. Nasoenteric tube present with distal end in 2nd portion of duodenum.  Removed and discarded. Benign-appearing esophageal stenosis at GE junction. Dilated to 15 mm. 5 cm hiatal hernia. 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  Acute urinary retention In&Out catheter 7/29 secondary to retention.  Continue bethanechol  10 mg po TID.  Plan to remove foley and do voiding trial.   Paroxysmal atrial fibrillation Continue amiodarone , Eliquis .  Continue telemetry.   Moderate protein calorie malnutrition Dietary on board.  Currently has cortrak in place receiving tube feeds. Pending barium swallow for further GI recs.   Physical debilitation muscle weakness PT/OT following.  Given patient's hospitalization, likely that he will need rehab once his hypoxia improved.      Out of bed to chair. Incentive spirometry. Nursing supportive care. Fall, aspiration precautions. Diet:  Diet Orders (From admission, onward)     Start     Ordered   09/19/23 1310  DIET SOFT Fluid consistency: Thin  Diet effective now       Question:  Fluid consistency:  Answer:  Thin   09/19/23 1309           DVT prophylaxis:   Level of care: Med-Surg   Code Status: Full Code  Subjective: Patient was seen and examined at bedside after EGD.  Patient tolerated procedure well.  Denied any active issues, no worsening of shortness of breath and no chest pain. Patient denied any active issues.   Physical Exam: Vitals:   09/19/23 1242 09/19/23 1249 09/19/23 1258 09/19/23 1308  BP: (!) 92/51 (!) 101/52 (!) 96/50 (!) 99/51  Pulse: 73 71 69 64  Resp: 15 (!) 23 (!) 21 (!) 23  Temp: (!) 97.5 F (36.4 C)     TempSrc: Temporal     SpO2: 100% 98% 93% 90%  Weight:      Height:        General - Elderly ill Caucasian male, sleeping, no respiratory distress HEENT - PERRLA, EOMI, atraumatic head, non tender sinuses. Lung - Clear, basal rales, diffuse rhonchi, no wheezes. Heart - S1, S2 heard, no murmurs, rubs, trace pedal  edema. Abdomen - Soft, non tender, bowel sounds good. Neuro - Alert, awake and oriented, non focal exam. Skin - Warm and dry.  Data Reviewed:      Latest Ref Rng & Units 09/19/2023    3:46 AM 09/14/2023    1:52 AM 09/13/2023    3:42 AM  CBC  WBC 4.0 - 10.5 K/uL 6.6  7.1  9.3   Hemoglobin 13.0 - 17.0 g/dL 9.1  9.5  9.6   Hematocrit 39.0 - 52.0 % 28.7  29.9  29.9   Platelets 150 - 400 K/uL 274  169  180       Latest Ref Rng & Units 09/19/2023    3:46 AM 09/14/2023    1:52 AM 09/13/2023    3:42 AM  BMP  Glucose 70 - 99 mg/dL 85  897  891   BUN 8 - 23 mg/dL 20  24  22    Creatinine 0.61 - 1.24 mg/dL 9.34  9.24  9.17   Sodium 135 - 145 mmol/L 137  138  136   Potassium 3.5 - 5.1 mmol/L 4.5  4.4  4.0   Chloride 98 - 111 mmol/L 102  104  100   CO2 22 - 32 mmol/L 28  26  30    Calcium  8.9 - 10.3 mg/dL 8.3  7.9  7.8    DG Chest 2 View Result Date: 09/18/2023 EXAM: 2 VIEW(S) XRAY OF THE CHEST 09/18/2023 12:50:00 PM COMPARISON: 2 chest radiograph dated 09/09/2023, CT chest dated 09/06/2023. CLINICAL HISTORY: 200808 Hypoxia 200808. HYPOXIA,SOB,COUGH,CONGESTION FINDINGS: LUNGS AND PLEURA: Increased right apical opacity. Interstitial opacities in the right mid and lower lungs are decreased in conspicuity. Minimal left basilar patchy opacity. HEART AND MEDIASTINUM: No acute abnormality of the cardiac and mediastinal silhouettes. BONES AND SOFT TISSUES: No acute osseous abnormality. LINES AND TUBES: Feeding tube tip terminates below the field of view. IMPRESSION: 1. Increased right apical opacity, which may represent pneumonia or loculated pleural effusion 2. Decreased interstitial opacities in the right mid and lower lungs, likely improving pneumonia. 3. No focal left lung consolidation. Minimal left basilar patchy opacity may reflect residual infection. Electronically signed by: Manford Cummins MD 09/18/2023 04:42 PM EDT RP Workstation: [id number]   DG ESOPHAGUS W SINGLE CM (SOL OR THIN BA) Result Date:  09/17/2023 CLINICAL DATA:  Chronic dysphagia. History of esophageal stricture in 2019. Suspected history of achalasia, although unable to confirm the chart review. IR consulted for esophagram for further evaluation and to rule out achalasia or other esophageal abnormality. EXAM: ESOPHAGUS/BARIUM SWALLOW/TABLET STUDY TECHNIQUE: Single contrast examination was performed using thin liquid barium. Limited study due to poor patient mobility and poor oral intake secondary to inadequate passage of barium through the esophagus. This exam was performed by Kimble Clas, PA-C, and was supervised and interpreted by Dr. Jearld. FLUOROSCOPY: Radiation Exposure Index (as provided by the fluoroscopic device): 7.30 mGy Kerma COMPARISON:  None Available. FINDINGS:  Swallowing: Grossly unremarkable although not completely evaluated due to patient's physical condition. Pharynx: Grossly unremarkable although not completely evaluated due to patient's physical condition. Esophagus: Relative narrowing of mild segment of the very distal esophagus. Persistent smooth oval filling defect over the distal esophagus immediately proximal to the relative narrowing as cannot exclude a mass in this location. Esophageal motility: Poor esophageal motility with significant tertiary contractions. Hiatal Hernia: Not present. Gastroesophageal reflux: Not evaluated. Ingested 13 mm barium tablet: Not given. Other: None. IMPRESSION: Relative narrowing of the very distal mild segment of the esophagus with persistent oval smoothly bordered filling defect within the distal esophagus just proximal to this narrowing. Recommend further evaluation with direct visualization. Electronically Signed   By: Toribio Agreste M.D.   On: 09/17/2023 16:32    Procedures EGD: - Food in the lower third of the esophagus. Removal was successful. Nasoenteric tube present with distal end in 2nd portion of duodenum. Removed and discarded. Benign-appearing esophageal stenosis at GE  junction. Dilated to 15 mm. 5 cm hiatal hernia. Gastroesophageal flap valve classified as Hill  Grade IV (no fold, wide open lumen, hiatal hernia present)  Family Communication: no family at bedside.  Disposition: Status is: Inpatient Remains inpatient appropriate because: hypoxia, barium swallow, GI follow up, home o2 eval.  Planned Discharge Destination: Rehab     Time spent: 40 minutes  Author: Elvan Sor, MD 09/19/2023 1:25 PM Secure chat 7am to 7pm For on call review [email or web address].

## 2023-09-19 NOTE — Anesthesia Postprocedure Evaluation (Signed)
 Anesthesia Post Note  Patient: Jlon Betker  Procedure(s) Performed: EGD (ESOPHAGOGASTRODUODENOSCOPY) Balloon dilation wire-guided     Patient location during evaluation: Endoscopy Anesthesia Type: MAC Level of consciousness: awake and alert Pain management: pain level controlled Vital Signs Assessment: post-procedure vital signs reviewed and stable Respiratory status: spontaneous breathing, nonlabored ventilation, respiratory function stable and patient connected to nasal cannula oxygen Cardiovascular status: blood pressure returned to baseline and stable Postop Assessment: no apparent nausea or vomiting Anesthetic complications: no   No notable events documented.  Last Vitals:  Vitals:   09/19/23 1308 09/19/23 1342  BP: (!) 99/51   Pulse: 64   Resp: (!) 23   Temp:    SpO2: 90% 97%    Last Pain:  Vitals:   09/19/23 1308  TempSrc:   PainSc: 0-No pain                 Adrea Sherpa L Ryana Montecalvo

## 2023-09-20 DIAGNOSIS — R6521 Severe sepsis with septic shock: Secondary | ICD-10-CM | POA: Diagnosis not present

## 2023-09-20 DIAGNOSIS — A419 Sepsis, unspecified organism: Secondary | ICD-10-CM | POA: Diagnosis not present

## 2023-09-20 LAB — BASIC METABOLIC PANEL WITH GFR
Anion gap: 8 (ref 5–15)
BUN: 23 mg/dL (ref 8–23)
CO2: 26 mmol/L (ref 22–32)
Calcium: 8.2 mg/dL — ABNORMAL LOW (ref 8.9–10.3)
Chloride: 104 mmol/L (ref 98–111)
Creatinine, Ser: 0.8 mg/dL (ref 0.61–1.24)
GFR, Estimated: >60 mL/min (ref >60)
Glucose, Bld: 103 mg/dL — ABNORMAL HIGH (ref 70–99)
Potassium: 4.3 mmol/L (ref 3.5–5.1)
Sodium: 138 mmol/L (ref 135–145)

## 2023-09-20 LAB — CBC
HCT: 28.9 % — ABNORMAL LOW (ref 39.0–52.0)
Hemoglobin: 9.2 g/dL — ABNORMAL LOW (ref 13.0–17.0)
MCH: 26.8 pg (ref 26.0–34.0)
MCHC: 31.8 g/dL (ref 30.0–36.0)
MCV: 84.3 fL (ref 80.0–100.0)
Platelets: 293 K/uL (ref 150–400)
RBC: 3.43 MIL/uL — ABNORMAL LOW (ref 4.22–5.81)
RDW: 21.2 % — ABNORMAL HIGH (ref 11.5–15.5)
WBC: 6.3 K/uL (ref 4.0–10.5)
nRBC: 0 % (ref 0.0–0.2)

## 2023-09-20 LAB — GLUCOSE, CAPILLARY
Glucose-Capillary: 110 mg/dL — ABNORMAL HIGH (ref 70–99)
Glucose-Capillary: 117 mg/dL — ABNORMAL HIGH (ref 70–99)
Glucose-Capillary: 155 mg/dL — ABNORMAL HIGH (ref 70–99)
Glucose-Capillary: 172 mg/dL — ABNORMAL HIGH (ref 70–99)
Glucose-Capillary: 234 mg/dL — ABNORMAL HIGH (ref 70–99)
Glucose-Capillary: 79 mg/dL (ref 70–99)
Glucose-Capillary: 86 mg/dL (ref 70–99)

## 2023-09-20 LAB — VITAMIN B12: Vitamin B-12: 849 pg/mL (ref 180–914)

## 2023-09-20 LAB — IRON AND TIBC
Iron: 25 ug/dL — ABNORMAL LOW (ref 45–182)
Saturation Ratios: 11 % — ABNORMAL LOW (ref 17.9–39.5)
TIBC: 234 ug/dL — ABNORMAL LOW (ref 250–450)
UIBC: 209 ug/dL

## 2023-09-20 LAB — VITAMIN D 25 HYDROXY (VIT D DEFICIENCY, FRACTURES): Vit D, 25-Hydroxy: 35.48 ng/mL (ref 30–100)

## 2023-09-20 LAB — MAGNESIUM: Magnesium: 1.7 mg/dL (ref 1.7–2.4)

## 2023-09-20 LAB — PHOSPHORUS: Phosphorus: 3.7 mg/dL (ref 2.5–4.6)

## 2023-09-20 MED ORDER — MELATONIN 5 MG PO TABS: 5.0000 mg | ORAL_TABLET | Freq: Every evening | ORAL | Status: DC | PRN

## 2023-09-20 MED ORDER — POLYSACCHARIDE IRON COMPLEX 150 MG PO CAPS
150.0000 mg | ORAL_CAPSULE | Freq: Every day | ORAL | Status: DC
  Administered 2023-09-20 – 2023-09-26 (×10): 150 mg via ORAL
  Filled 2023-09-20 (×7): qty 1

## 2023-09-20 MED ORDER — THIAMINE MONONITRATE 100 MG PO TABS
100.0000 mg | ORAL_TABLET | Freq: Every day | ORAL | Status: DC
  Administered 2023-09-20 – 2023-09-26 (×10): 100 mg via ORAL
  Filled 2023-09-20 (×7): qty 1

## 2023-09-20 MED ORDER — ATORVASTATIN CALCIUM 40 MG PO TABS
40.0000 mg | ORAL_TABLET | Freq: Every day | ORAL | Status: DC
  Administered 2023-09-20 – 2023-09-26 (×10): 40 mg via ORAL
  Filled 2023-09-20 (×7): qty 1

## 2023-09-20 MED ORDER — OXYCODONE-ACETAMINOPHEN 5-325 MG PO TABS
1.0000 | ORAL_TABLET | Freq: Four times a day (QID) | ORAL | Status: DC | PRN
  Administered 2023-09-20 – 2023-09-26 (×18): 1 via ORAL
  Filled 2023-09-20 (×13): qty 1

## 2023-09-20 MED ORDER — VITAMIN C 500 MG PO TABS
500.0000 mg | ORAL_TABLET | Freq: Every day | ORAL | Status: DC
  Administered 2023-09-20 – 2023-09-26 (×10): 500 mg via ORAL
  Filled 2023-09-20 (×7): qty 1

## 2023-09-20 MED ORDER — APIXABAN 5 MG PO TABS
5.0000 mg | ORAL_TABLET | Freq: Two times a day (BID) | ORAL | Status: DC
  Administered 2023-09-20 – 2023-09-26 (×18): 5 mg via ORAL
  Filled 2023-09-20 (×13): qty 1

## 2023-09-20 MED ORDER — AMIODARONE HCL 200 MG PO TABS
200.0000 mg | ORAL_TABLET | Freq: Every day | ORAL | Status: DC
  Administered 2023-09-20 – 2023-09-22 (×3): 200 mg via ORAL
  Filled 2023-09-20 (×4): qty 1

## 2023-09-20 MED ORDER — HYDROXYZINE HCL 25 MG PO TABS
25.0000 mg | ORAL_TABLET | Freq: Three times a day (TID) | ORAL | Status: DC | PRN
  Administered 2023-09-20 – 2023-09-22 (×3): 25 mg via ORAL
  Filled 2023-09-20 (×3): qty 1

## 2023-09-20 MED ORDER — ADULT MULTIVITAMIN W/MINERALS CH
1.0000 | ORAL_TABLET | Freq: Every day | ORAL | Status: DC
  Administered 2023-09-20 – 2023-09-26 (×10): 1 via ORAL
  Filled 2023-09-20 (×7): qty 1

## 2023-09-20 NOTE — TOC Progression Note (Signed)
 Transition of Care [facility]) - Progression Note    Patient Details  Name: Isiaah Cuervo MRN: [id number] Date of Birth: May 07, 1960  Transition [facility] CM/SW Contact  Rosaline JONELLE Joe, RN Phone Number: 09/20/2023, 4:10 PM  Clinical Narrative:    Cm met with the patient at the bedside and patient's Cortrak has been removed.  Foley has been removed as well.  Patient will likely discharge to home in the next 1-2 days.  Walk test for home oxygen was completed at the bedside with nursing.  Patient was offered Medicare choice regarding home oxygen company and patient does not have a preference.  DME home oxygen order and DME note was placed - walk test attached - MD co-signed. [employer] was called to delivery portable oxygen tank to the bedside in the am.  CM will continue to follow the patient for pending discharge to home in the next 1-2 days.  Patient's sister plans to provide transportation home by car.   Expected Discharge Plan: Home w Home Health Services Barriers to Discharge: Continued Medical Work up               Expected Discharge Plan and Services In-house Referral: NA Discharge Planning Services: CM Consult Post Acute Care Choice: Durable Medical Equipment, Home Health Living arrangements for the past 2 months: Mobile Home                   DME Agency: NA       HH Arranged: PT, OT HH Agency: [facility] Date [facility] Agency Contacted: 09/06/23 Time HH Agency Contacted: 1414 Representative spoke with at [facility] Agency: Amy   Social Drivers of Health (SDOH) Interventions SDOH Screenings   Food Insecurity: No Food Insecurity (08/28/2023)  Housing: Low Risk  (08/28/2023)  Transportation Needs: No Transportation Needs (08/28/2023)  Utilities: Not At Risk (08/28/2023)  Tobacco Use: High Risk (09/19/2023)    Readmission Risk Interventions    09/18/2023   11:26 AM 08/30/2023    1:18 PM  Readmission Risk Prevention Plan  Transportation Screening Complete Complete   PCP or Specialist Appt within 5-7 Days Complete   Home Care Screening Complete   Medication Review (RN CM) Complete   HRI or Home Care Consult  Complete  Social Work Consult for Recovery Care Planning/Counseling  Complete  Palliative Care Screening  Not Applicable  Medication Review [employer])  Referral to Pharmacy

## 2023-09-20 NOTE — Progress Notes (Addendum)
    Durable Medical Equipment  (From admission, onward)           Start     Ordered   09/20/23 1559  For home use only DME oxygen  Once       Question Answer Comment  Length of Need 6 Months   Mode or (Route) Nasal cannula   Liters per Minute 4   Frequency Continuous (stationary and portable oxygen unit needed)   Oxygen conserving device Yes   Oxygen delivery system Gas      09/20/23 1558   09/20/23 1412  For home use only DME oxygen  Once       Question Answer Comment  Length of Need 12 Months   Liters per Minute 4   Frequency Continuous (stationary and portable oxygen unit needed)   Oxygen conserving device Yes   Oxygen delivery system Gas      09/20/23 1412   09/18/23 1136  For home use only DME Bedside commode  Once       Comments: Needs 3:1 since patient has weak mobility and unable to mobilize to bathroom.  Question Answer Comment  Patient needs a bedside commode to treat with the following condition Septic shock [facility])   Patient needs a bedside commode to treat with the following condition Physical deconditioning      09/18/23 1136             Janit Salines, LPN  Licensed Practical Nurse   Progress Notes    Signed   Date of Service: 09/20/2023  3:53 PM   Signed      SATURATION QUALIFICATIONS: (This note is used to comply with regulatory documentation for home oxygen)   Patient Saturations on Room Air at Rest = 91%   Patient Saturations on Room Air while Ambulating = 88%   Patient Saturations on 4 Liters of oxygen while Ambulating = 95%   Please briefly explain why patient needs home oxygen: Patient is SOB and wheezing.

## 2023-09-20 NOTE — Progress Notes (Signed)
 Physical Therapy Treatment Patient Details Name: [name] MRN: [id number] DOB: [birth year] Today's Date: 09/20/2023   History of Present Illness Pt is a 63 yr old male who presented 08/27/23 due to SOB, decrease in appetite. Pt the went into septic shock and was transferred into ICU and transferred out on 09/02/23.  Increased O2 requirement on 7/25 and noted chest CT with cavitation of R UL suggestive of necrosis so pt underwent bronchoscopy and transferred back to ICU with HHFNC to 35L 94% FiO2. PMH: HTN, COPD, PAF on eliquis , NICM, HFpEF    PT Comments  Pt is thrilled to have Cortrak out and is eager to work with therapy today. Pt is limited in safe mobility by increased O2 demand (4L O2 via [place]), in presence of decreased strength, balance and endurance. Pt is CGA for bed mobility, minA for transfers and CGA for ambulation in room. Pt with HR in 120s bpm, with ambulation returns to 100s with return to bed. RN in room to remove Foley at end of session. D/c plans remain appropriate. PT has reviewed and updated goals. PT will continue to follow acutely.    If plan is discharge home, recommend the following: Help with stairs or ramp for entrance;Assistance with cooking/housework;Assist for transportation;A lot of help with walking and/or transfers;A lot of help with bathing/dressing/bathroom   Can travel by private vehicle     No  Equipment Recommendations  Other (comment) (TBA)       Precautions / Restrictions Precautions Precautions: Fall Precaution/Restrictions Comments: watch SpO2 Restrictions Weight Bearing Restrictions Per Provider Order: No     Mobility  Bed Mobility Overal bed mobility: Needs Assistance Bed Mobility: Supine to Sit, Sit to Supine     Supine to sit: Contact guard, HOB elevated, Used rails     General bed mobility comments: CGA for safety with bed mobility with pt relying on bed rails with HOB elevated.    Transfers Overall transfer level: Needs  assistance Equipment used: Rolling walker (2 wheels) Transfers: Sit to/from Stand, Bed to chair/wheelchair/BSC Sit to Stand: Min assist           General transfer comment: good power up, requires light min A for steadying in RW    Ambulation/Gait Ambulation/Gait assistance: Contact guard assist Gait Distance (Feet): 18 Feet Assistive device: Rolling walker (2 wheels) Gait Pattern/deviations: Step-through pattern, Trunk flexed Gait velocity: decreased Gait velocity interpretation: <1.31 ft/sec, indicative of household ambulator   General Gait Details: pt able to ambulate to door and back, pt with very flexed posture, requiring constant cuing and visual demonstration of proximity to RW, easily tires and request to return to bed and not walk in hallway         Balance Overall balance assessment: Needs assistance Sitting-balance support: No upper extremity supported, Feet supported Sitting balance-[name]: Fair Sitting balance - Comments: sits EOB several minutes and then on commode ~20 min with intermittent UE support without LOB   Standing balance support: Bilateral upper extremity supported, During functional activity Standing balance-[name]: Poor Standing balance comment: reliant on UE support                            Communication Communication Communication: No apparent difficulties  Cognition Arousal: Alert Behavior During Therapy: Flat affect   PT - Cognitive impairments: No apparent impairments  PT - Cognition Comments: much more up beat today, glad to have Cortrak out Following commands: Intact      Cueing Cueing Techniques: Verbal cues  Exercises Other Exercises Other Exercises: marching in place x10    General Comments General comments (skin integrity, edema, etc.): SpO2 on 4L O2 via [place] 90-95 with ambulation, HR 103 at rest, 123bpm with ambulation returnse to 100s quickly upon sitting       Pertinent Vitals/Pain Pain Assessment Pain Assessment: Faces Faces Pain Scale: Hurts little more Pain Location: stomach Pain Descriptors / Indicators: Discomfort, Grimacing, Sore Pain Intervention(s): Limited activity within patient's tolerance, Monitored during session, Repositioned     PT Goals (current goals can now be found in the care plan section) Acute Rehab PT Goals Patient Stated Goal: to go home PT Goal Formulation: With patient Time For Goal Achievement: 10/03/23 Potential to Achieve Goals: Fair Progress towards PT goals: Progressing toward goals    Frequency    Min 2X/week       AM-PAC PT 6 Clicks Mobility   Outcome Measure  Help needed turning from your back to your side while in a flat bed without using bedrails?: A Little Help needed moving from lying on your back to sitting on the side of a flat bed without using bedrails?: A Little Help needed moving to and from a bed to a chair (including a wheelchair)?: A Little Help needed standing up from a chair using your arms (e.g., wheelchair or bedside chair)?: A Little Help needed to walk in hospital room?: A Little Help needed climbing 3-5 steps with a railing? : Total 6 Click Score: 16    End of Session Equipment Utilized During Treatment: Gait belt Activity Tolerance: Patient tolerated treatment well Patient left: in bed;with call bell/phone within reach;with bed alarm set;with nursing/sitter in room Nurse Communication: Mobility status PT Visit Diagnosis: Muscle weakness (generalized) (M62.81);Other abnormalities of gait and mobility (R26.89);Unsteadiness on feet (R26.81);Difficulty in walking, not elsewhere classified (R26.2)     Time: 8844-8789 PT Time Calculation (min) (ACUTE ONLY): 15 min  Charges:    $Therapeutic Exercise: 8-22 mins PT General Charges $$ ACUTE PT VISIT: 1 Visit                     Nilda Keathley B. Fleeta Lapidus PT, DPT Acute Rehabilitation Services Please use secure chat or   Call Office [phone]    [name] [facility] 09/20/2023, 12:26 PM

## 2023-09-20 NOTE — Progress Notes (Signed)
 Progress Note   Patient: [name] FMW:[id number] DOB: 1960-05-11 DOA: 08/27/2023     24 DOS: the patient was seen and examined on 09/20/2023   Brief hospital course: [place]kin [address] 63yo male smoker with hx HTN, COPD, PAF on eliquis , NICM, HFpEF presented 7/14 with 1 week progressive SOB, cough, malaise, poor appetite. On EMS arrival with hypoxic with sats 80% on RA. In ER initially BP was soft with SBP 80's, tachy 130's but responded well to 1L fluids. CXR revealed dense RUL PNA. He was admitted by [facility], started on IV rocephin /azithro. On 7/15 he was still boarding in ER and again having hypotension with SBP 80's and transferred to PCCM, s/p bronch as he required high flow O2. He is transferred to [facility] 7/30. PCCM on board. He is on 5L oxygen, has cortrak for feeding, not eating well.   Assessment and Plan:  # Septic shock Vasopressor medications weaned off.  Blood pressure stable.  Transferred from [facility]M to [facility] 7/30.  Finished linezolid  plus meropenem  total 7 days.   # Acute hypoxic respiratory failure Continue supplemental O2 inhalation and gradually wean off 8/7 currently on 4 L oxygen via nasal cannula RN was advised to qualify him for home oxygen and inform to case manager to arrange oxygen for home use Planning to discharge him tomorrow or  a day after when stable.  # Community-acquired pneumonia, right upper lobe opacification. Patient completed antibiotics under PCCM service Patient needs to follow with pulmonology as an outpatient for further management and repeat imaging.  Will probably benefit from cancer screening and biopsy as an outpatient if needed   # COPD exacerbation Possible contributing etiology to hypoxia.  Continue nebulizers. 8/6 Mucinex  600 mg p.o. twice daily Tussionex prn for cough  # Acute HFpEF Likely contributing etiology to patient's hypoxia.  Responding well to IV diuresis.  Caution as his BP lower side.  Continue to monitor urine output.  Supplemental  O2 as above.   # Dysphagia History of achalasia in the past.  Modified barium showing retention in the esophagus.  No overt vomiting at this time.  Continues on liquid diet.  Consulted GI recommended barium swallow. He is tolerating regular diet. Pending barium swallow, he will need outpatient GI follow up. GI consulted, s/p EGD:  - Food in the lower third of the esophagus. Removal was successful. Nasoenteric tube present with distal end in 2nd portion of duodenum. Removed and discarded. Benign-appearing esophageal stenosis at GE junction. Dilated to 15 mm. 5 cm hiatal hernia. 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  # Acute urinary retention In&Out catheter 7/29 secondary to retention.  Continue bethanechol  10 mg po TID.  Plan to remove foley and do voiding trial. 8/7 discontinued Foley catheter, RN was advised to do bladder scan to follow voiding protocol and inform if patient is retaining then we can reinsert Foley catheter.   # Paroxysmal atrial fibrillation Continue amiodarone , Continue telemetry. 8/7 resumed Eliquis   Moderate protein calorie malnutrition Dietary on board.  Currently has cortrak in place receiving tube feeds. Pending barium swallow for further GI recs.   Physical debilitation muscle weakness PT/OT following.  Given patient's hospitalization, likely that he will need rehab once his hypoxia improved.      Out of bed to chair. Incentive spirometry. Nursing supportive care. Fall, aspiration precautions. Diet:  Diet Orders (From admission, onward)     Start     Ordered   09/19/23 1310  DIET SOFT Fluid consistency: Thin  Diet effective now       Question:  Fluid consistency:  Answer:  Thin   09/19/23 1309           DVT prophylaxis:  apixaban  (ELIQUIS ) tablet 5 mg  Level of care: Med-Surg   Code Status: Full Code  Subjective: Patient was seen and examined at bedside.  No significant  events overnight, patient still has shortness of breath at rest and dyspnea on exertion, patient was able to walk around the room.  Still requiring further oxygen via nasal cannula. Catheter was discontinued in the morning and patient has not voided yet. We will continue to monitor today and plan for disposition in 1 to 2 days.   Physical Exam: Vitals:   09/20/23 0412 09/20/23 0413 09/20/23 0737 09/20/23 0820  BP:  107/70  130/81  Pulse:  93  (!) 111  Resp:  18  19  Temp:  97.6 F (36.4 C)  (!) 97.3 F (36.3 C)  TempSrc:    Oral  SpO2:  95% 93% 92%  Weight: 79.2 kg     Height:        General - Elderly ill Caucasian male, sleeping, no respiratory distress HEENT - PERRLA, EOMI, atraumatic head, non tender sinuses. Lung -equal air entry bilaterally, bilateral mild crackles, no wheezes Heart - S1, S2 heard, no murmurs, rubs, trace pedal edema. Abdomen - Soft, non tender, bowel sounds good. Neuro - Alert, awake and oriented, non focal exam. Skin - Warm and dry.  Data Reviewed:      Latest Ref Rng & Units 09/20/2023    1:59 AM 09/19/2023    3:46 AM 09/14/2023    1:52 AM  CBC  WBC 4.0 - 10.5 K/uL 6.3  6.6  7.1   Hemoglobin 13.0 - 17.0 g/dL 9.2  9.1  9.5   Hematocrit 39.0 - 52.0 % 28.9  28.7  29.9   Platelets 150 - 400 K/uL 293  274  169       Latest Ref Rng & Units 09/20/2023    1:59 AM 09/19/2023    3:46 AM 09/14/2023    1:52 AM  BMP  Glucose 70 - 99 mg/dL 896  85  897   BUN 8 - 23 mg/dL 23  20  24    Creatinine 0.61 - 1.24 mg/dL 9.19  9.34  9.24   Sodium 135 - 145 mmol/L 138  137  138   Potassium 3.5 - 5.1 mmol/L 4.3  4.5  4.4   Chloride 98 - 111 mmol/L 104  102  104   CO2 22 - 32 mmol/L 26  28  26    Calcium  8.9 - 10.3 mg/dL 8.2  8.3  7.9    No results found.   Procedures EGD: - Food in the lower third of the esophagus. Removal was successful. Nasoenteric tube present with distal end in 2nd portion of duodenum. Removed and discarded. Benign-appearing esophageal stenosis at  GE junction. Dilated to 15 mm. 5 cm hiatal hernia. Gastroesophageal flap valve classified as Hill  Grade IV (no fold, wide open lumen, hiatal hernia present)  Family Communication: no family at bedside.  Disposition: Status is: Inpatient Remains inpatient appropriate because: hypoxia, barium swallow, GI follow up, home o2 eval.  Planned Discharge Destination: Rehab     Time spent: 40 minutes  Author: Elvan Sor, MD 09/20/2023 1:35 PM Secure chat 7am to 7pm For on call review [email or web address].

## 2023-09-20 NOTE — Progress Notes (Signed)
 SATURATION QUALIFICATIONS: (This note is used to comply with regulatory documentation for home oxygen)  Patient Saturations on Room Air at Rest = 91%  Patient Saturations on Room Air while Ambulating = 88%  Patient Saturations on 4 Liters of oxygen while Ambulating = 95%  Please briefly explain why patient needs home oxygen: Patient is SOB and wheezing.

## 2023-09-20 NOTE — Plan of Care (Signed)

## 2023-09-21 ENCOUNTER — Encounter (HOSPITAL_COMMUNITY): Payer: Self-pay | Admitting: Internal Medicine

## 2023-09-21 DIAGNOSIS — I5031 Acute diastolic (congestive) heart failure: Secondary | ICD-10-CM

## 2023-09-21 DIAGNOSIS — A419 Sepsis, unspecified organism: Secondary | ICD-10-CM | POA: Diagnosis not present

## 2023-09-21 DIAGNOSIS — R6521 Severe sepsis with septic shock: Secondary | ICD-10-CM | POA: Diagnosis not present

## 2023-09-21 DIAGNOSIS — I4891 Unspecified atrial fibrillation: Secondary | ICD-10-CM

## 2023-09-21 LAB — GLUCOSE, CAPILLARY
Glucose-Capillary: 106 mg/dL — ABNORMAL HIGH (ref 70–99)
Glucose-Capillary: 107 mg/dL — ABNORMAL HIGH (ref 70–99)
Glucose-Capillary: 122 mg/dL — ABNORMAL HIGH (ref 70–99)
Glucose-Capillary: 134 mg/dL — ABNORMAL HIGH (ref 70–99)
Glucose-Capillary: 90 mg/dL (ref 70–99)
Glucose-Capillary: 91 mg/dL (ref 70–99)

## 2023-09-21 LAB — BASIC METABOLIC PANEL WITH GFR
Anion gap: 11 (ref 5–15)
BUN: 25 mg/dL — ABNORMAL HIGH (ref 8–23)
CO2: 27 mmol/L (ref 22–32)
Calcium: 8.6 mg/dL — ABNORMAL LOW (ref 8.9–10.3)
Chloride: 106 mmol/L (ref 98–111)
Creatinine, Ser: 0.7 mg/dL (ref 0.61–1.24)
GFR, Estimated: >60 mL/min (ref >60)
Glucose, Bld: 117 mg/dL — ABNORMAL HIGH (ref 70–99)
Potassium: 4.4 mmol/L (ref 3.5–5.1)
Sodium: 144 mmol/L (ref 135–145)

## 2023-09-21 LAB — MAGNESIUM: Magnesium: 1.8 mg/dL (ref 1.7–2.4)

## 2023-09-21 LAB — CBC
HCT: 31.2 % — ABNORMAL LOW (ref 39.0–52.0)
Hemoglobin: 10 g/dL — ABNORMAL LOW (ref 13.0–17.0)
MCH: 26.8 pg (ref 26.0–34.0)
MCHC: 32.1 g/dL (ref 30.0–36.0)
MCV: 83.6 fL (ref 80.0–100.0)
Platelets: 345 K/uL (ref 150–400)
RBC: 3.73 MIL/uL — ABNORMAL LOW (ref 4.22–5.81)
RDW: 20.9 % — ABNORMAL HIGH (ref 11.5–15.5)
WBC: 7 K/uL (ref 4.0–10.5)
nRBC: 0 % (ref 0.0–0.2)

## 2023-09-21 LAB — PHOSPHORUS: Phosphorus: 3.2 mg/dL (ref 2.5–4.6)

## 2023-09-21 MED ORDER — FUROSEMIDE 40 MG PO TABS
40.0000 mg | ORAL_TABLET | Freq: Every day | ORAL | Status: DC
  Administered 2023-09-22 – 2023-09-24 (×4): 40 mg via ORAL
  Filled 2023-09-21 (×3): qty 1

## 2023-09-21 MED ORDER — METOPROLOL TARTRATE 25 MG PO TABS
25.0000 mg | ORAL_TABLET | Freq: Two times a day (BID) | ORAL | Status: DC
  Administered 2023-09-21: 25 mg via ORAL
  Filled 2023-09-21: qty 1

## 2023-09-21 MED ORDER — METOPROLOL TARTRATE 25 MG PO TABS
37.5000 mg | ORAL_TABLET | Freq: Two times a day (BID) | ORAL | Status: DC
  Administered 2023-09-21 – 2023-09-22 (×2): 37.5 mg via ORAL
  Filled 2023-09-21 (×2): qty 1

## 2023-09-21 MED ORDER — METOPROLOL TARTRATE 5 MG/5ML IV SOLN
5.0000 mg | Freq: Once | INTRAVENOUS | Status: AC
  Administered 2023-09-21: 5 mg via INTRAVENOUS
  Filled 2023-09-21: qty 5

## 2023-09-21 NOTE — Progress Notes (Signed)
 Physical Therapy Treatment Patient Details Name: [name] MRN: [id number] DOB: [birth year] Today's Date: 09/21/2023   History of Present Illness Pt is a 63 yr old male who presented 08/27/23 due to SOB, decrease in appetite. Pt the went into septic shock and was transferred into ICU and transferred out on 09/02/23.  Increased O2 requirement on 7/25 and noted chest CT with cavitation of R UL suggestive of necrosis so pt underwent bronchoscopy and transferred back to ICU with HHFNC to 35L 94% FiO2. PMH: HTN, COPD, PAF on eliquis , NICM, HFpEF    PT Comments  Pt is making good progress the last 2 days, with improvement in endurance and stability with gait. Pt continues to require 4L O2 via [place] to maintain SpO2 >90%O2 with ambulation.  Pt is overall contact guard for mobility. Discussed possibility of someone staying with him the first few days to help him when he gets home. Pt reports he will ask a friend. D/c plans remain appropriate. PT will continue to follow acutely.    If plan is discharge home, recommend the following: Help with stairs or ramp for entrance;Assistance with cooking/housework;Assist for transportation;A lot of help with walking and/or transfers;A lot of help with bathing/dressing/bathroom   Can travel by private vehicle     No  Equipment Recommendations  Other (comment) (TBA)       Precautions / Restrictions Precautions Precautions: Fall Precaution/Restrictions Comments: watch SpO2 Restrictions Weight Bearing Restrictions Per Provider Order: No     Mobility  Bed Mobility Overal bed mobility: Needs Assistance Bed Mobility: Supine to Sit, Sit to Supine     Supine to sit: Contact guard, HOB elevated, Used rails     General bed mobility comments: CGA for safety with bed mobility with pt relying on bed rails with HOB elevated.    Transfers Overall transfer level: Needs assistance Equipment used: Rolling walker (2 wheels) Transfers: Sit to/from Stand, Bed to  chair/wheelchair/BSC Sit to Stand: Contact guard assist           General transfer comment: good power up and self steady in RW    Ambulation/Gait Ambulation/Gait assistance: Contact guard assist Gait Distance (Feet): 40 Feet Assistive device: Rolling walker (2 wheels) Gait Pattern/deviations: Step-through pattern, Trunk flexed Gait velocity: decreased Gait velocity interpretation: <1.31 ft/sec, indicative of household ambulator   General Gait Details: pt able to increase gait distance and velocity today exhibiting increased stability and more upright posture       Balance Overall balance assessment: Needs assistance Sitting-balance support: No upper extremity supported, Feet supported Sitting balance-[name]: Fair Sitting balance - Comments: sits EOB several minutes and then on commode ~20 min with intermittent UE support without LOB   Standing balance support: Bilateral upper extremity supported, During functional activity Standing balance-[name]: Poor Standing balance comment: reliant on UE support                            Communication Communication Communication: No apparent difficulties  Cognition Arousal: Alert Behavior During Therapy: Flat affect   PT - Cognitive impairments: No apparent impairments                       PT - Cognition Comments: much more up beat today, glad to have Cortrak out Following commands: Intact      Cueing Cueing Techniques: Verbal cues     General Comments General comments (skin integrity, edema, etc.): Pt continues to need  4L O2 via  with ambulation to maintain SpO2 >90%O2, max noted HR with gait in 130s      Pertinent Vitals/Pain Pain Assessment Pain Assessment: Faces Faces Pain Scale: Hurts little more Pain Location: stomach Pain Descriptors / Indicators: Discomfort, Grimacing, Sore Pain Intervention(s): Limited activity within patient's tolerance, Monitored during session, Repositioned      PT Goals (current goals can now be found in the care plan section) Acute Rehab PT Goals Patient Stated Goal: to go home PT Goal Formulation: With patient Time For Goal Achievement: 10/03/23 Potential to Achieve Goals: Fair Progress towards PT goals: Progressing toward goals    Frequency    Min 2X/week       AM-PAC PT 6 Clicks Mobility   Outcome Measure  Help needed turning from your back to your side while in a flat bed without using bedrails?: A Little Help needed moving from lying on your back to sitting on the side of a flat bed without using bedrails?: A Little Help needed moving to and from a bed to a chair (including a wheelchair)?: A Little Help needed standing up from a chair using your arms (e.g., wheelchair or bedside chair)?: A Little Help needed to walk in hospital room?: A Little Help needed climbing 3-5 steps with a railing? : Total 6 Click Score: 16    End of Session Equipment Utilized During Treatment: Gait belt Activity Tolerance: Patient tolerated treatment well Patient left: in bed;with call bell/phone within reach;with bed alarm set;with nursing/sitter in room Nurse Communication: Mobility status PT Visit Diagnosis: Muscle weakness (generalized) (M62.81);Other abnormalities of gait and mobility (R26.89);Unsteadiness on feet (R26.81);Difficulty in walking, not elsewhere classified (R26.2)     Time: 8981-8962 PT Time Calculation (min) (ACUTE ONLY): 19 min  Charges:    $Gait Training: 8-22 mins PT General Charges $$ ACUTE PT VISIT: 1 Visit                     Balbina Depace B. Fleeta Lapidus PT, DPT Acute Rehabilitation Services Please use secure chat or  Call Office [phone]    [name] [facility] 09/21/2023, 12:41 PM

## 2023-09-21 NOTE — Plan of Care (Signed)

## 2023-09-21 NOTE — Plan of Care (Signed)

## 2023-09-21 NOTE — Consult Note (Addendum)
 Cardiology Consultation   Patient ID: [name] MRN: [id number]; DOB: 02/26/1960  Admit date: 08/27/2023 Date of Consult: 09/21/2023  PCP:  Zena Frederick, DO   [facility] Providers Cardiologist:  [facility] Dr Raylene   Patient Profile: [name] is a 63 y.o. male with a hx of non-obstructive CAD, dilated cardiomyopathy with improved LVEF,  paroxymal A fib/flutter on Eliquis  and amiodarone , s/p atrial flutter ablation 2017,  COPD, tobacco use, dyslipidemia, HTN, who is being seen 09/21/2023 for the evaluation of A fib at the request of Dr Von  History of Present Illness:  [name] with above PMH presented to ER 08/27/23 for progressive SOB, cough, poor appetite, hypoxia, hypotension. He was admitted for sepsis due to CAP. He shortly was transferred to ICU due to septic shock, AKI/ATN, acute hypoxic respiratory failure. He did require vasopressor support, high flow Mier oxygen, antibiotic therapy/steroid/bronchodilators. He had episode of A fib RVR where beta blocker was added to PTA amiodarone . He had mild elevation of troponin that was felt demand ischemia.  Echo 08/28/23 showed LVEF 55-60%, no RWMA, normal RV, mod LAE, trivial AI. He was transferred back to hospitalist 09/02/23. He had worsening respiratory status and oxygen need, ICU re-consulted 09/06/23, antibiotic broadened for HCAP coverage, and underwent flexible bronchoscopy with bronchial alveolar lavage on 09/07/23 showing normal airways and yellow thin secretions, BAL culture negative. Cortrak tube feeds inserted/started 09/07/23 for nutrition support due to dysphagia. He was transferred back to hospitalist 09/12/23. He had difficulty weaning off oxygen with persistent dyspnea and hypoxia, IV lasix  was given for concern of volume overload. GI was consulted 09/16/23 due to esophageal dysphagia noted on modified barium swallow study 09/11/23. He reportedly had hx of dysphagia and esophageal stricture requiring esophageal  dilations in the past. He was recommended barium esophagram to evaluate for evidence of achalasia. Esophagram 09/17/23 showed relative narrowing of the very distal mild segment of the esophagus with persistent oval smoothly bordered filling defect within the distal esophagus just proximal to this narrowing. EGD was performed 09/19/23 showed food in lower third of esophagus, food and BG tube were removed, benign appearing esophageal stenosis at GE junction dilated to 15mm, see report for further detail. He was transitioned to PO diet and GI had cleared eliquis  resume on 09/19/23 night. Due A fib RVR, cardiology is consulted today for further evaluation.   Labs today showed resolved AKI/ATN with Cr 0.7. CBC with Hgb 10. CXR from 09/18/23 showed Increased right apical opacity, which may represent pneumonia or loculated pleural effusion. Decreased interstitial opacities in the right mid and lower lungs, likely improving pneumonia. No focal left lung consolidation. Minimal left basilar patchy opacity may reflect residual infection. EKG from today at 1328 showed coarse A fib with ventricular rate of 97bpm. He is currently on amiodarone  200mg  daily, eliquis  5mg  BID, lasix  20mg  daily, metoprolol  25mg  BID. IV metoprolol  5mg  x1 was given this morning.   Per chart review on care everywhere, historically he follows Dr Raylene at [facility] for cardiology, has chronic history of paroxysmal A fib/flutter, underwent atrial flutter ablation in 2017. He has been maintained on Eliquis , amiodarone , and coreg  historically for A fib/flutter. He had developed recurrence of A fib with increased SOB in 11/ 2023, Cardizem was added, he was referred to EP Dr Epifanio 01/22/23, was placed on monitor to quantify A fib burden with plan to increase amiodarone  dosing to 300mg  if indicated. Appears he was lost to follow up and did not complete the Zio.  He did have history of dilated cardiomyopathy that was presumed secondary to alcohol use. LVEF  was at  40% with moderate dilated RV on Echo 10/22/15 at [facility]. LHC at [facility] last in 11/02/2015 showed Moderate diffuse LAD and LCx disease with no targets for PCI; Severe diffuse disease of small caliber, co-dominant RCA; LVEDP 10 mm Hg; Mild pulmonary hypertension. July 2018 he had NM stress done at [facility], no report available, office notes from [facility] mentioned it was normal.   Most recent Echo from [facility] 09/04/22 showed LVEF 55-60%, normal WM, normal RV, normal LA and RA size, no valvular disease.  He was suppose to take lasix  20mg  daily in 2024.  He had improved LVEF over the years.   Upon encounter today, he is laying in bed. He states he has been here for a long time. He started eating solid food a few days ago, has been tolerating well, able to drink fluids. He has been ambulating for a week, feels SOB with walking. He was sitting in bed earlier today, nurse noted his HR was elevated 140s in A fib. He states he was not aware of the fast heart rate, denied any chest pain, palpitation, dizziness, or syncope. He felt his leg edema is improving. He was on lasix  twice daily PTA.    Past Medical History:  Diagnosis Date   Anxiety    Arthritis    Cardiomyopathy (HCC)    CHF (congestive heart failure) (HCC)    COPD (chronic obstructive pulmonary disease) (HCC)    Dysphagia    with esophageal stricture   Dysrhythmia    atrial fib   GERD (gastroesophageal reflux disease)    History of blood transfusion    Hypertension     Past Surgical History:  Procedure Laterality Date   ACETABULAR REVISION Right 05/15/2012   Procedure: ACETABULAR REVISION;  Surgeon: Dempsey LULLA Moan, MD;  Location: [facility] ORS;  Service: Orthopedics;  Laterality: Right;  RIGHT HIP BEARING SURFACE VS ACETABULAR REVISION    ESOPHAGOGASTRODUODENOSCOPY     with dilitation   ESOPHAGOGASTRODUODENOSCOPY N/A 09/19/2023   Procedure: EGD (ESOPHAGOGASTRODUODENOSCOPY);  Surgeon: Albertus Gordy HERO, MD;  Location: [facility] ENDOSCOPY;  Service:  Gastroenterology;  Laterality: N/A;   EYE SURGERY Right    cataract extraction with IOL   FLEXIBLE BRONCHOSCOPY Bilateral 09/07/2023   Procedure: BRONCHOSCOPY, FLEXIBLE;  Surgeon: Kara Dorn NOVAK, MD;  Location: [facility] ENDOSCOPY;  Service: Pulmonary;  Laterality: Bilateral;   JOINT REPLACEMENT Bilateral    hips/revision right x 4     Home Medications:  Prior to Admission medications   Medication Sig Start Date End Date Taking? Authorizing Provider  albuterol  (PROVENTIL ) (2.5 MG/3ML) 0.083% nebulizer solution Take 2.5 mg by nebulization every 4 (four) hours as needed for wheezing or shortness of breath. 05/20/18  Yes [provider]  amiodarone  (PACERONE ) 200 MG tablet Take 200 mg by mouth daily. 08/02/16  Yes [provider]  apixaban  (ELIQUIS ) 5 MG TABS tablet Take 5 mg by mouth 2 (two) times daily. 09/04/17  Yes [provider]  aspirin  81 MG chewable tablet Chew 81 mg by mouth daily. 05/03/16  Yes [provider]  atorvastatin  (LIPITOR) 80 MG tablet Take 80 mg by mouth daily. 10/09/16 08/29/23 Yes [provider]  diltiazem (CARDIZEM CD) 120 MG 24 hr capsule Take 120 mg by mouth daily.   Yes [provider]  esomeprazole (NEXIUM) 40 MG capsule Take 40 mg by mouth every morning. 06/04/23  Yes [provider]  fluticasone  (FLONASE ) 50 MCG/ACT nasal spray Place 2 sprays into the nose daily as needed for allergies.   Yes [provider]  furosemide  (LASIX ) 20 MG tablet Take 20 mg by mouth daily.   Yes [provider]  lisinopril (ZESTRIL) 10 MG tablet Take 10 mg by mouth daily.   Yes [provider]  magnesium  oxide (MAG-OX) 400 MG tablet Take 400 mg by mouth daily. 05/01/18  Yes [provider]  potassium chloride  (KLOR-CON ) 10 MEQ tablet Take 10 mEq by mouth daily.   Yes [provider]  spironolactone  (ALDACTONE ) 25 MG tablet Take 25 mg by mouth every morning. Patient not taking: Reported on  08/29/2023    [provider]    Scheduled Meds:  amiodarone   200 mg Oral Daily   apixaban   5 mg Oral BID   arformoterol   15 mcg Nebulization BID   ascorbic acid   500 mg Oral Daily   atorvastatin   40 mg Oral Daily   bethanechol   10 mg Oral TID   Chlorhexidine  Gluconate Cloth  6 each Topical Daily   feeding supplement  237 mL Oral TID BM   [START ON 09/22/2023] furosemide   40 mg Oral Daily   guaiFENesin   600 mg Oral BID   insulin  aspart  0-9 Units Subcutaneous Q4H   iron  polysaccharides  150 mg Oral Daily   metoprolol  tartrate  37.5 mg Oral BID   multivitamin with minerals  1 tablet Oral Daily   mouth rinse  15 mL Mouth Rinse 4 times per day   pantoprazole   40 mg Oral QAC breakfast   revefenacin   175 mcg Nebulization Daily   sodium chloride  flush  10-40 mL Intracatheter Q12H   thiamine   100 mg Oral Daily   Continuous Infusions:   PRN Meds: chlorpheniramine-HYDROcodone, diazepam , fluticasone , hydrOXYzine , ipratropium-albuterol , lip balm, loperamide , melatonin, mouth rinse, oxyCODONE -acetaminophen , phenol, polyethylene glycol, prochlorperazine , sodium chloride   Allergies:    Allergies  Allergen Reactions   Codeine Nausea And Vomiting    Social History:   Social History   Socioeconomic History   Marital status: Single    Spouse name: Not on file   Number of children: Not on file   Years of education: Not on file   Highest education level: Not on file  Occupational History   Not on file  Tobacco Use   Smoking status: Every Day    Current packs/day: 1.00    Average packs/day: 1 pack/day for 32.0 years (32.0 ttl pk-yrs)    Types: Cigarettes   Smokeless tobacco: Never  Substance and Sexual Activity   Alcohol use: No   Drug use: No   Sexual activity: Not on file  Other Topics Concern   Not on file  Social History Narrative   On disability. He lives alone.   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   Social Drivers of Corporate investment banker  Strain: Not on file  Food Insecurity: No Food Insecurity (08/28/2023)   Hunger Vital Sign    Worried About Running Out of Food in the Last Year: Never true    Ran Out of Food in the Last Year: Never true  Transportation Needs: No Transportation Needs (08/28/2023)   PRAPARE - Administrator, Civil Service (Medical): No    Lack of Transportation (Non-Medical): No  Physical Activity: Not on file  Stress: Not on file  Social Connections: Not on file  Intimate Partner Violence: Not At Risk (08/28/2023)  Humiliation, Afraid, Rape, and Kick questionnaire    Fear of Current or Ex-Partner: No    Emotionally Abused: No    Physically Abused: No    Sexually Abused: No    Family History:    Family History  Problem Relation Age of Onset   Diabetes Mother    Stroke Father      ROS:  Constitutional: Denied fever, chills, malaise, night sweats Eyes: Denied vision change or loss Ears/Nose/Mouth/Throat: Denied ear ache, sore throat, coughing, sinus pain Cardiovascular: see HPI  Respiratory: see HPI  Gastrointestinal: Denied nausea, vomiting, abdominal pain, diarrhea Genital/Urinary: Denied dysuria, hematuria, urinary frequency/urgency Musculoskeletal: global weakness  Skin: Denied rash, wound Neuro: Denied headache, dizziness, syncope Psych: Denied history of depression/anxiety  Endocrine: Denied history of diabetes   Physical Exam/Data: Vitals:   09/21/23 0401 09/21/23 0500 09/21/23 0740 09/21/23 1149  BP: 123/75  (!) 124/93 106/69  Pulse: 87  91   Resp: 18  18 18   Temp: (!) 97.5 F (36.4 C)  98 F (36.7 C) 97.6 F (36.4 C)  TempSrc:      SpO2: 97%  98% 96%  Weight:  78.5 kg    Height:        Intake/Output Summary (Last 24 hours) at 09/21/2023 1553 Last data filed at 09/20/2023 2100 Gross per 24 hour  Intake --  Output 600 ml  Net -600 ml      09/21/2023    5:00 AM 09/20/2023    4:12 AM 09/19/2023   11:01 AM  Last 3 Weights  Weight (lbs) 173 lb 1 oz 174 lb 9.7 oz  169 lb 12.1 oz  Weight (kg) 78.5 kg 79.2 kg 77 kg     Body mass index is 25.56 kg/m.   Vitals:  Vitals:   09/21/23 0740 09/21/23 1149  BP: (!) 124/93 106/69  Pulse: 91   Resp: 18 18  Temp: 98 F (36.7 C) 97.6 F (36.4 C)  SpO2: 98% 96%   General Appearance: In no apparent distress, laying in bed, appears older than his age  HEENT: Normocephalic, atraumatic.  Neck: Supple, trachea midline, no JVDs Cardiovascular: Irregularly irregular, S1 S2, no murmur  Respiratory: Resting breathing unlabored, lungs sounds clear but diminished at base to auscultation bilaterally, no use of accessory muscles. On 3L  oxygen.  Gastrointestinal: Bowel sounds positive, abdomen soft, non-tender Extremities: Able to move all extremities in bed without difficulty, BLE edema 1+ Musculoskeletal: Normal muscle bulk and tone, global weakness  Skin: Intact, warm, dry. No rashes  Neurologic: Alert, oriented to person, place and time. Fluent speech. No cognitive deficit. No focal weakness  Psychiatric: Normal affect. Mood is appropriate.   EKG:  The EKG was personally reviewed and demonstrates:     EKG from today at 1328 showed coarse A fib with ventricular rate of 97bpm.   Telemetry:  Telemetry was personally reviewed and demonstrates:    A fib RVR 110-130s    Relevant CV Studies:   Echo 08/28/23:   1. Left ventricular ejection fraction, by estimation, is 55 to 60%. The  left ventricle has normal function. The left ventricle has no regional  wall motion abnormalities.   2. Right ventricular systolic function is normal. The right ventricular  size is normal. Tricuspid regurgitation signal is inadequate for assessing  PA pressure.   3. Left atrial size was moderately dilated.   4. The mitral valve is normal in structure. No evidence of mitral valve  regurgitation. No evidence of mitral stenosis.  5. The aortic valve is normal in structure. Aortic valve regurgitation is  trivial.    Comparison(s): Only apical views were obtained.    Echo from [facility] 09/04/22:  SUMMARY  Normal LV size, wall thickness, wall motion and systolic function with ejection fraction 55-60%  There is no significant valvular stenosis or regurgitation.  There is no significant change in comparison with the last study.  -  FINDINGS  LEFT VENTRICLE  Normal LV size, wall thickness, wall motion and systolic function with ejection fraction 55-60%.  Left ventricular systolic function is normal. Left atrial pressure is indeterminate due to  atrial  fibrillation . The left ventricular wall motion is normal.  -  RIGHT VENTRICLE  The right ventricle is normal in size and function.  LEFT ATRIUM  The left atrial size is normal.  RIGHT ATRIUM  Right atrial size is normal. There is no Doppler evidence for a patent foramen ovale.  -  AORTIC VALVE  Structurally normal aortic valve. The aortic valve is trileaflet. There is no aortic stenosis. There  is no aortic regurgitation.  -  MITRAL VALVE  The mitral valve leaflets appear normal. There is no mitral regurgitation noted. No significant  mitral valve stenosis.  -  TRICUSPID VALVE  Structurally normal tricuspid valve. There is trace tricuspid regurgitation. RVSP not able to be  calculated. There is no tricuspid stenosis.  -  PULMONIC VALVE  The pulmonic valve is not well visualized. Structurally normal pulmonic valve. There is no pulmonic  valvular regurgitation. There is no pulmonic valvular stenosis.  -  ARTERIES  The aortic sinus is normal size. The ascending aorta is normal size.  -  VENOUS  Pulmonary venous flow pattern not well visualized. IVC size was normal.  -  EFFUSION  There is no pericardial effusion.  -   LHC on 11/02/2015 at [facility]:  Findings:  1. Moderate diffuse LAD and LCx disease- no targets for PCI  2. Severe diffuse disease of small caliber, co-dominant RCA  3. Decreased LV function on previous echo- LV  gram not performed  4. LVEDP 10 mm Hg  5. Mild pulmonary hypertension     Laboratory Data: High Sensitivity Troponin:   Recent Labs  Lab 08/27/23 1518 08/27/23 1937  TROPONINIHS 29* 42*     Chemistry Recent Labs  Lab 09/19/23 0346 09/19/23 0930 09/20/23 0159 09/21/23 0147  NA 137  --  138 144  K 4.5  --  4.3 4.4  CL 102  --  104 106  CO2 28  --  26 27  GLUCOSE 85  --  103* 117*  BUN 20  --  23 25*  CREATININE 0.65  --  0.80 0.70  CALCIUM  8.3*  --  8.2* 8.6*  MG  --  1.8 1.7 1.8  GFRNONAA >60  --  >60 >60  ANIONGAP 7  --  8 11    No results for input(s): PROT, ALBUMIN , AST, ALT, ALKPHOS, BILITOT in the last 168 hours. Lipids No results for input(s): CHOL, TRIG, HDL, LABVLDL, LDLCALC, CHOLHDL in the last 168 hours.  Hematology Recent Labs  Lab 09/19/23 0346 09/20/23 0159 09/21/23 0147  WBC 6.6 6.3 7.0  RBC 3.37* 3.43* 3.73*  HGB 9.1* 9.2* 10.0*  HCT 28.7* 28.9* 31.2*  MCV 85.2 84.3 83.6  MCH 27.0 26.8 26.8  MCHC 31.7 31.8 32.1  RDW 20.6* 21.2* 20.9*  PLT 274 293 345   Thyroid No results for input(s): TSH, FREET4 in the last  168 hours.  BNPNo results for input(s): BNP, PROBNP in the last 168 hours.  DDimer No results for input(s): DDIMER in the last 168 hours.  Radiology/Studies:  DG Chest 2 View Result Date: 09/18/2023 EXAM: 2 VIEW(S) XRAY OF THE CHEST 09/18/2023 12:50:00 PM COMPARISON: 2 chest radiograph dated 09/09/2023, CT chest dated 09/06/2023. CLINICAL HISTORY: 200808 Hypoxia 200808. HYPOXIA,SOB,COUGH,CONGESTION FINDINGS: LUNGS AND PLEURA: Increased right apical opacity. Interstitial opacities in the right mid and lower lungs are decreased in conspicuity. Minimal left basilar patchy opacity. HEART AND MEDIASTINUM: No acute abnormality of the cardiac and mediastinal silhouettes. BONES AND SOFT TISSUES: No acute osseous abnormality. LINES AND TUBES: Feeding tube tip terminates below the field of view. IMPRESSION: 1. Increased  right apical opacity, which may represent pneumonia or loculated pleural effusion 2. Decreased interstitial opacities in the right mid and lower lungs, likely improving pneumonia. 3. No focal left lung consolidation. Minimal left basilar patchy opacity may reflect residual infection. Electronically signed by: Manford Cummins MD 09/18/2023 04:42 PM EDT RP Workstation: [id number]     Assessment and Plan:  Paroxysmal A fib RVR - long standing history of A fib/flutter, s/p ablation for atrial flutter 2017 at [facility], on chronic amiodarone  and coreg  and eliquis  historically - now admitted over 25 days for septic shock due to pneumonia, course complicated by prolonged hypoxia, malnutrition due to dysphagia requiring esophageal dilation, AKI/ATN, COPD exacerbation, CHF exacerbation, urinary retention, and deconditioning/debility.  - expect he will have A fib RVR with above mentioned conditions, he is not symptomatic,  continue amiodarone  200mg  daily, will increase metoprolol  from 25mg  BID to 37.5mg  BID for better rate control, stop PTA diltiazem 120mg  (he has not been getting anyway), would not aggressively lower his rate,  keep Mag >2 and K >4, keep Hgb >7, continue PTA Eliquis  5mg  BID for anticoagulation  Dilated cardiomyopathy with recovered LVEF Acute HFimEF  Non-obstructive CAD  - Historically felt due to ETOH use - LHC from 2017 at [facility] showed Moderate diffuse LAD and LCx disease with no targets for PCI; Severe diffuse disease of small caliber, co-dominant RCA; stress NM 2018 was negative per cardiology office reports  - Echo from 08/28/23 showed LVEF 55-60%, no RWMA, normal RV, mod LAE, trivial AI  - he has no chest pain, Hs trop minimal likely demand ischemia, c/o DOE and improved BLE edema  - he is mildly hypervolemic on exam, suspect DOE is also COPD contributing - will increase Lasix  from 20 to 40mg  daily , trend I&O, daily weight  - GDMT: he was not taking spironolactone  25mg  PTA, on PTA lisinopril 10mg   daily; AKI has resolved, if BP stable with beta blocker titration and lasix , can resume lisinopril next   Resolved septic shock 2/2 pneumonia Acute hypoxic respiratory failure  COPD exacerbation  Esophageal stenosis s/p dilation  Resolved AKI/ATN Resolved acute urinary rentention  Moderate protein calorie malnutrition  Decondition/debility  - per IM      Risk Assessment/Risk Scores:   New York  Heart Association (NYHA) Functional Class NYHA Class II  CHA2DS2-VASc Score = 3  This indicates a 3.2% annual risk of stroke. The patient's score is based upon: CHF History: 1 HTN History: 1 Diabetes History: 0 Stroke History: 0 Vascular Disease History: 1 Age Score: 0 Gender Score: 0     For questions or updates, please contact [facility] Please consult [email or web address] for contact info under    Signed, Xika Zhao, NP  09/21/2023 3:53 PM  Patient seen, examined. Available data reviewed. Agree  with findings, assessment, and plan as outlined by Shirl Fruits, NP.  The patient is independently interviewed and examined.  He is alert, oriented, in no distress.  HEENT is normal, JVP is normal, there are no carotid bruits, lungs with scattered rhonchi bilaterally, heart is irregularly irregular and distant with no audible murmur or gallop, abdomen is soft and nontender, extremities have no edema, skin is warm and dry with no rash.  The patient's telemetry is reviewed and shows atrial fibrillation with RVR that began on August 6 and has been continuous over the last 48 hours.  He was reported to have atrial fibrillation earlier in his hospitalization but went back into sinus rhythm spontaneously.  The patient has a longstanding history of persistent atrial fibrillation managed with a rhythm control strategy using amiodarone .  He is chronically anticoagulated with apixaban .  He saw Dr. Epifanio in December 2024 who recommended that his amiodarone  be increased to 300 mg daily due to a  subtherapeutic blood level.  The patient has no perception of his atrial fibrillation.  His heart rates are mildly elevated at 100 to 110 bpm at rest.  I think he can be managed conservatively here with a modest increase in his beta-blocker dose.  Increase metoprolol  to 37.5 mg twice daily.  Continue amiodarone  at current dose.  He has had a prolonged hospitalization with management of acute on chronic respiratory failure secondary to community-acquired pneumonia and COPD exacerbation.  I do not think aggressive rhythm control management such as cardioversion is indicated at this time.  He should remain on his oral anticoagulation with apixaban  and continue his current medicines with amiodarone  and metoprolol  at an increased dose as above.  He should follow-up with his primary cardiologist, Dr. Raylene, for posthospital follow-up.  If he remains in atrial fibrillation, elective cardioversion could be considered once he is fully recovered from his pneumonia.  Otherwise, as outlined above.  Ozell Fell, M.D. 09/21/2023 4:29 PM

## 2023-09-21 NOTE — Progress Notes (Signed)
 Progress Note   Patient: [name] FMW:[id number] DOB: January 19, 1961 DOA: 08/27/2023     25 DOS: the patient was seen and examined on 09/21/2023   Brief hospital course: [name] 63yo male smoker with hx HTN, COPD, PAF on eliquis , NICM, HFpEF presented 7/14 with 1 week progressive SOB, cough, malaise, poor appetite. On EMS arrival with hypoxic with sats 80% on RA. In ER initially BP was soft with SBP 80's, tachy 130's but responded well to 1L fluids. CXR revealed dense RUL PNA. He was admitted by [facility], started on IV rocephin /azithro. On 7/15 he was still boarding in ER and again having hypotension with SBP 80's and transferred to PCCM, s/p bronch as he required high flow O2. He is transferred to [facility] 7/30. PCCM on board. He is on 5L oxygen, has cortrak for feeding, not eating well.   Assessment and Plan:  #A-fib with RVR H/o PAF, flipped back to A-fib with RVR on 8/8 S/p Lopressor  5 mg IV x 1 dose Started Lopressor  25 mg p.o. twice daily Continued amiodarone  200 mg pod 8/7 resumed Eliquis  Continue to monitor on telemetry Cardiology consulted    # Septic shock: Resolved Vasopressor medications weaned off.  Blood pressure stable.  Transferred from PCCM to [facility] 7/30.  Finished linezolid  plus meropenem  total 7 days.   # Acute hypoxic respiratory failure Continue supplemental O2 inhalation and gradually wean off 8/7 currently on 4 L oxygen via nasal cannula RN was advised to qualify him for home oxygen and inform to case manager to arrange oxygen for home use Planning to discharge him tomorrow or  a day after when stable. 8/6 respiratory failure is gradually improving  # Community-acquired pneumonia, right upper lobe opacification. Patient completed antibiotics under PCCM service Patient needs to follow with pulmonology as an outpatient for further management and repeat imaging.  Will probably benefit from cancer screening and biopsy as an outpatient if needed   # COPD  exacerbation Possible contributing etiology to hypoxia.  Continue nebulizers. 8/6 Mucinex  600 mg p.o. twice daily Tussionex prn for cough  # Acute HFpEF Likely contributing etiology to patient's hypoxia.  Responding well to IV diuresis.  Caution as his BP lower side.  Continue to monitor urine output.  Supplemental O2 as above.   # Dysphagia History of achalasia in the past.  Modified barium showing retention in the esophagus.  No overt vomiting at this time.  Continues on liquid diet.  Consulted GI recommended barium swallow. He is tolerating regular diet. Pending barium swallow, he will need outpatient GI follow up. GI consulted, s/p EGD:  - Food in the lower third of the esophagus. Removal was successful. Nasoenteric tube present with distal end in 2nd portion of duodenum. Removed and discarded. Benign-appearing esophageal stenosis at GE junction. Dilated to 15 mm. 5 cm hiatal hernia. 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  # Acute urinary retention: resolved  In&Out catheter 7/29 secondary to retention.  Continue bethanechol  10 mg po TID.  Plan to remove foley and do voiding trial. 8/7 discontinued Foley catheter, RN was advised to do bladder scan to follow voiding protocol and inform if patient is retaining then we can reinsert Foley catheter. 8/8 passed voiding trial, patient denies any discomfort   Moderate protein calorie malnutrition Dietary on board.  Currently has cortrak in place receiving tube feeds. Pending barium swallow for further GI recs.   Physical debilitation muscle weakness PT/OT evaluation done, recommended  SNF placement but patient declined and he would like to go home with home health services.  Home health PT will be arranged by TOC.      Out of bed to chair. Incentive spirometry. Nursing supportive care. Fall, aspiration precautions. Diet:  Diet Orders (From admission, onward)     Start      Ordered   09/19/23 1310  DIET SOFT Fluid consistency: Thin  Diet effective now       Question:  Fluid consistency:  Answer:  Thin   09/19/23 1309           DVT prophylaxis:  apixaban  (ELIQUIS ) tablet 5 mg  Level of care: Med-Surg   Code Status: Full Code  Subjective: Patient was seen and examined at bedside.  Patient developed A-fib with RVR, denies any chest pain or palpitations.  Shortness of breath is improving.   Physical Exam: Vitals:   09/21/23 0401 09/21/23 0500 09/21/23 0740 09/21/23 1149  BP: 123/75  (!) 124/93 106/69  Pulse: 87  91   Resp: 18  18 18   Temp: (!) 97.5 F (36.4 C)  98 F (36.7 C) 97.6 F (36.4 C)  TempSrc:      SpO2: 97%  98% 96%  Weight:  78.5 kg    Height:        General - Elderly ill Caucasian male, sleeping, no respiratory distress HEENT - PERRLA, EOMI, atraumatic head, non tender sinuses. Lung -equal air entry bilaterally, bilateral mild crackles, no wheezes Heart -irregular rhythm, no murmurs, no pedal edema. Abdomen - Soft, non tender, bowel sounds good. Neuro - Alert, awake and oriented, non focal exam. Skin - Warm and dry.  Data Reviewed:      Latest Ref Rng & Units 09/21/2023    1:47 AM 09/20/2023    1:59 AM 09/19/2023    3:46 AM  CBC  WBC 4.0 - 10.5 K/uL 7.0  6.3  6.6   Hemoglobin 13.0 - 17.0 g/dL 89.9  9.2  9.1   Hematocrit 39.0 - 52.0 % 31.2  28.9  28.7   Platelets 150 - 400 K/uL 345  293  274       Latest Ref Rng & Units 09/21/2023    1:47 AM 09/20/2023    1:59 AM 09/19/2023    3:46 AM  BMP  Glucose 70 - 99 mg/dL 882  896  85   BUN 8 - 23 mg/dL 25  23  20    Creatinine 0.61 - 1.24 mg/dL 9.29  9.19  9.34   Sodium 135 - 145 mmol/L 144  138  137   Potassium 3.5 - 5.1 mmol/L 4.4  4.3  4.5   Chloride 98 - 111 mmol/L 106  104  102   CO2 22 - 32 mmol/L 27  26  28    Calcium  8.9 - 10.3 mg/dL 8.6  8.2  8.3    No results found.   Procedures EGD: - Food in the lower third of the esophagus. Removal was successful. Nasoenteric  tube present with distal end in 2nd portion of duodenum. Removed and discarded. Benign-appearing esophageal stenosis at GE junction. Dilated to 15 mm. 5 cm hiatal hernia. Gastroesophageal flap valve classified as Hill  Grade IV (no fold, wide open lumen, hiatal hernia present)  Family Communication: no family at bedside.  Disposition: Status is: Inpatient Remains inpatient appropriate because: hypoxia, barium swallow, GI follow up, home o2 eval.  Planned Discharge Destination: Rehab     Time spent: 55 minutes  Author:  Elvan Sor, MD 09/21/2023 3:19 PM Secure chat 7am to 7pm For on call review [email or web address].

## 2023-09-21 NOTE — Progress Notes (Signed)
 Mobility Specialist: Progress Note   09/21/23 1516  Mobility  Activity Ambulated with assistance  Level of Assistance Contact guard assist, steadying assist  Assistive Device Front wheel walker  Distance Ambulated (ft) 60 ft  Activity Response Tolerated well  Mobility Referral Yes  Mobility visit 1 Mobility  Mobility Specialist Start Time (ACUTE ONLY) 1503  Mobility Specialist Stop Time (ACUTE ONLY) 1512  Mobility Specialist Time Calculation (min) (ACUTE ONLY) 9 min    Pt received on EOB, agreeable to mobility session. CGA throughout. C/o bil hip pain and SOB/fatigue. SpO2 90-95% on 4LO2 during ambulation. Ambulated 30' without fault. Min verbla cues for RW proximity. Left on EOB with all needs met, call bell in reach.   Ileana Lute Mobility Specialist Please contact via SecureChat or Rehab office at [phone]

## 2023-09-22 DIAGNOSIS — A419 Sepsis, unspecified organism: Secondary | ICD-10-CM | POA: Diagnosis not present

## 2023-09-22 DIAGNOSIS — R6521 Severe sepsis with septic shock: Secondary | ICD-10-CM | POA: Diagnosis not present

## 2023-09-22 LAB — GLUCOSE, CAPILLARY
Glucose-Capillary: 104 mg/dL — ABNORMAL HIGH (ref 70–99)
Glucose-Capillary: 83 mg/dL (ref 70–99)
Glucose-Capillary: 110 mg/dL — ABNORMAL HIGH (ref 70–99)
Glucose-Capillary: 104 mg/dL — ABNORMAL HIGH (ref 70–99)
Glucose-Capillary: 69 mg/dL — ABNORMAL LOW (ref 70–99)

## 2023-09-22 LAB — MAGNESIUM: Magnesium: 1.7 mg/dL (ref 1.7–2.4)

## 2023-09-22 LAB — PHOSPHORUS: Phosphorus: 3.4 mg/dL (ref 2.5–4.6)

## 2023-09-22 MED ORDER — METOPROLOL TARTRATE 25 MG PO TABS
25.0000 mg | ORAL_TABLET | Freq: Two times a day (BID) | ORAL | Status: DC
  Administered 2023-09-22: 25 mg via ORAL
  Filled 2023-09-22 (×3): qty 1

## 2023-09-22 MED ORDER — POLYETHYLENE GLYCOL 3350 17 G PO PACK: 17.0000 g | PACK | Freq: Every day | ORAL | Status: DC | PRN

## 2023-09-22 MED ORDER — DIAZEPAM 5 MG PO TABS
5.0000 mg | ORAL_TABLET | Freq: Two times a day (BID) | ORAL | Status: DC | PRN
  Administered 2023-09-22 – 2023-09-25 (×7): 5 mg via ORAL
  Filled 2023-09-22 (×4): qty 1

## 2023-09-22 MED ORDER — MAGNESIUM SULFATE 2 GM/50ML IV SOLN
2.0000 g | Freq: Once | INTRAVENOUS | Status: AC
  Administered 2023-09-22: 2 g via INTRAVENOUS
  Filled 2023-09-22: qty 50

## 2023-09-22 NOTE — Plan of Care (Signed)

## 2023-09-22 NOTE — Progress Notes (Signed)
 Rounding Note   Patient Name: [name] Date of Encounter: 09/22/2023  [facility] Cardiologist: [facility]  Subjective No complaints  Scheduled Meds:  amiodarone   200 mg Oral Daily   apixaban   5 mg Oral BID   arformoterol   15 mcg Nebulization BID   ascorbic acid   500 mg Oral Daily   atorvastatin   40 mg Oral Daily   bethanechol   10 mg Oral TID   Chlorhexidine  Gluconate Cloth  6 each Topical Daily   feeding supplement  237 mL Oral TID BM   furosemide   40 mg Oral Daily   guaiFENesin   600 mg Oral BID   insulin  aspart  0-9 Units Subcutaneous Q4H   iron  polysaccharides  150 mg Oral Daily   metoprolol  tartrate  37.5 mg Oral BID   multivitamin with minerals  1 tablet Oral Daily   mouth rinse  15 mL Mouth Rinse 4 times per day   pantoprazole   40 mg Oral QAC breakfast   revefenacin   175 mcg Nebulization Daily   sodium chloride  flush  10-40 mL Intracatheter Q12H   thiamine   100 mg Oral Daily   Continuous Infusions:  PRN Meds: chlorpheniramine-HYDROcodone, diazepam , fluticasone , hydrOXYzine , ipratropium-albuterol , lip balm, loperamide , melatonin, mouth rinse, oxyCODONE -acetaminophen , phenol, polyethylene glycol, prochlorperazine , sodium chloride    Vital Signs  Vitals:   09/21/23 1948 09/21/23 2100 09/21/23 2359 09/22/23 0418  BP: (!) 87/53  (!) 89/64 99/67  Pulse: (!) 103  92 94  Resp:   18   Temp: 97.8 F (36.6 C)  98.6 F (37 C) (!) 95.5 F (35.3 C)  TempSrc:   Oral   SpO2: 100% 97% 98% 95%  Weight:      Height:       No intake or output data in the 24 hours ending 09/22/23 0734    09/21/2023    5:00 AM 09/20/2023    4:12 AM 09/19/2023   11:01 AM  Last 3 Weights  Weight (lbs) 173 lb 1 oz 174 lb 9.7 oz 169 lb 12.1 oz  Weight (kg) 78.5 kg 79.2 kg 77 kg      Telemetry Afib 90s to 110s - Personally Reviewed  ECG  N/a - Personally Reviewed  Physical Exam  GEN: No acute distress.   Neck: No JVD Cardiac: Rirreg  Respiratory: mild crackles GI: Soft,  nontender, non-distended  MS: No edema; No deformity. Neuro:  Nonfocal  Psych: Normal affect   Labs High Sensitivity Troponin:   Recent Labs  Lab 08/27/23 1518 08/27/23 1937  TROPONINIHS 29* 42*     Chemistry Recent Labs  Lab 09/19/23 0346 09/19/23 0930 09/20/23 0159 09/21/23 0147 09/22/23 0146  NA 137  --  138 144  --   K 4.5  --  4.3 4.4  --   CL 102  --  104 106  --   CO2 28  --  26 27  --   GLUCOSE 85  --  103* 117*  --   BUN 20  --  23 25*  --   CREATININE 0.65  --  0.80 0.70  --   CALCIUM  8.3*  --  8.2* 8.6*  --   MG  --    < > 1.7 1.8 1.7  GFRNONAA >60  --  >60 >60  --   ANIONGAP 7  --  8 11  --    < > = values in this interval not displayed.    Lipids No results for input(s): CHOL, TRIG, HDL, LABVLDL, LDLCALC,  CHOLHDL in the last 168 hours.  Hematology Recent Labs  Lab 09/19/23 0346 09/20/23 0159 09/21/23 0147  WBC 6.6 6.3 7.0  RBC 3.37* 3.43* 3.73*  HGB 9.1* 9.2* 10.0*  HCT 28.7* 28.9* 31.2*  MCV 85.2 84.3 83.6  MCH 27.0 26.8 26.8  MCHC 31.7 31.8 32.1  RDW 20.6* 21.2* 20.9*  PLT 274 293 345   Thyroid No results for input(s): TSH, FREET4 in the last 168 hours.  BNPNo results for input(s): BNP, PROBNP in the last 168 hours.  DDimer No results for input(s): DDIMER in the last 168 hours.   Radiology  No results found.    Patient Profile   [name] is a 63 y.o. male with a hx of non-obstructive CAD, dilated cardiomyopathy with improved LVEF,  paroxymal A fib/flutter on Eliquis  and amiodarone , s/p atrial flutter ablation 2017,  COPD, tobacco use, dyslipidemia, HTN, who is being seen 09/21/2023 for the evaluation of A fib at the request of Dr Von   Assessment & Plan   1.PAF - long history of afib/aflutter, prior aflutter ablation 2017 at [facility] - on home amiodarone , coreg  - in setting of admission with septic shock, COPD exacberation, hypoxia, issues with afib with RVR - medical therapy with amio 200mg  daily, lopressor   37.5mg  bid, eliquis  5mg  bid.  -AV nodal agents limited by soft bp's - with systemic illness would suspect some degree of tachycardia. Currently rates look controlled.    2. Chronic HFimpEF - - Historically felt due to ETOH use - LHC from 2017 at [facility] showed Moderate diffuse LAD and LCx disease with no targets for PCI; Severe diffuse disease of small caliber, co-dominant RCA - Echo from 08/28/23 showed LVEF 55-60%, no RWMA, normal RV, mod LAE, trivial AI  - some signs of fluid overload, continue gradual diuresis with oral lasix . Avoid aggressive diuresis given soft bp's  For questions or updates, please contact [facility] Please consult [email or web address] for contact info under     Signed, Alvan Carrier, MD  09/22/2023, 7:34 AM

## 2023-09-22 NOTE — Progress Notes (Signed)
 Progress Note   Patient: [name] FMW:[id number] DOB: 1960/07/26 DOA: 08/27/2023     26 DOS: the patient was seen and examined on 09/22/2023   Brief hospital course: Atilla Zollner 63yo male smoker with hx HTN, COPD, PAF on eliquis , NICM, HFpEF presented 7/14 with 1 week progressive SOB, cough, malaise, poor appetite. On EMS arrival with hypoxic with sats 80% on RA. In ER initially BP was soft with SBP 80's, tachy 130's but responded well to 1L fluids. CXR revealed dense RUL PNA. He was admitted by [facility], started on IV rocephin /azithro. On 7/15 he was still boarding in ER and again having hypotension with SBP 80's and transferred to PCCM, s/p bronch as he required high flow O2. He is transferred to [facility] 7/30. PCCM on board. He is on 5L oxygen, has cortrak for feeding, not eating well.   Assessment and Plan:  #A-fib with RVR H/o PAF, flipped back to A-fib with RVR on 8/8 S/p Lopressor  5 mg IV x 1 dose 8/8 increased Lopressor  37.5 mg p.o. twice daily Continued amiodarone  200 mg pod 8/7 resumed Eliquis  Continue to monitor on telemetry Cardiology consulted, recommended to increase metoprolol , monitor on telemetry and mag repleted.     # Septic shock: Resolved Vasopressor medications weaned off.  Blood pressure stable.  Transferred from PCCM to [facility] 7/30.  Finished linezolid  plus meropenem  total 7 days.   # Acute hypoxic respiratory failure Continue supplemental O2 inhalation and gradually wean off 8/7 currently on 4 L oxygen via nasal cannula RN was advised to qualify him for home oxygen and inform to case manager to arrange oxygen for home use Planning to discharge him tomorrow or  a day after when stable. 8/6 respiratory failure is gradually improving  # Community-acquired pneumonia, right upper lobe opacification. Patient completed antibiotics under PCCM service Patient needs to follow with pulmonology as an outpatient for further management and repeat imaging.  Will probably  benefit from cancer screening and biopsy as an outpatient if needed   # COPD exacerbation Possible contributing etiology to hypoxia.  Continue nebulizers. 8/6 Mucinex  600 mg p.o. twice daily Tussionex prn for cough  # Acute HFpEF Likely contributing etiology to patient's hypoxia.  Responding well to IV diuresis.  Caution as his BP lower side.  Continue to monitor urine output.  Supplemental O2 as above.   # Dysphagia History of achalasia in the past.  Modified barium showing retention in the esophagus.  No overt vomiting at this time.  Continues on liquid diet.  Consulted GI recommended barium swallow. He is tolerating regular diet. Pending barium swallow, he will need outpatient GI follow up. GI consulted, s/p EGD:  - Food in the lower third of the esophagus. Removal was successful. Nasoenteric tube present with distal end in 2nd portion of duodenum. Removed and discarded. Benign-appearing esophageal stenosis at GE junction. Dilated to 15 mm. 5 cm hiatal hernia. 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  # Acute urinary retention: resolved  In&Out catheter 7/29 secondary to retention.  Continue bethanechol  10 mg po TID.  Plan to remove foley and do voiding trial. 8/7 discontinued Foley catheter, RN was advised to do bladder scan to follow voiding protocol and inform if patient is retaining then we can reinsert Foley catheter. 8/8 passed voiding trial, patient denies any discomfort   Moderate protein calorie malnutrition Dietary on board.  Currently has cortrak in place receiving tube feeds. Pending barium swallow for further  GI recs.   Physical debilitation muscle weakness PT/OT evaluation done, recommended SNF placement but patient declined and he would like to go home with home health services.  Home health PT will be arranged by TOC.      Out of bed to chair. Incentive spirometry. Nursing supportive care. Fall,  aspiration precautions. Diet:  Diet Orders (From admission, onward)     Start     Ordered   09/19/23 1310  DIET SOFT Fluid consistency: Thin  Diet effective now       Question:  Fluid consistency:  Answer:  Thin   09/19/23 1309           DVT prophylaxis:  apixaban  (ELIQUIS ) tablet 5 mg  Level of care: Med-Surg   Code Status: Full Code  Subjective: Patient was seen and examined at bedside.  Denied any chest pain or palpitations, breathing is improving, denies any worsening of symptoms.    Physical Exam: Vitals:   09/22/23 0704 09/22/23 0751 09/22/23 0848 09/22/23 0911  BP:  103/75  103/75  Pulse:  97 96 96  Resp:  17 18   Temp:  97.6 F (36.4 C)    TempSrc:      SpO2:  99% 97%   Weight: 77.9 kg     Height:        General - Elderly ill Caucasian male, sleeping, no respiratory distress HEENT - PERRLA, EOMI, atraumatic head, non tender sinuses. Lung -equal air entry bilaterally, bilateral mild crackles, no wheezes Heart -irregular rhythm, no murmurs, no pedal edema. Abdomen - Soft, non tender, bowel sounds good. Neuro - Alert, awake and oriented, non focal exam. Skin - Warm and dry.  Data Reviewed:      Latest Ref Rng & Units 09/21/2023    1:47 AM 09/20/2023    1:59 AM 09/19/2023    3:46 AM  CBC  WBC 4.0 - 10.5 K/uL 7.0  6.3  6.6   Hemoglobin 13.0 - 17.0 g/dL 89.9  9.2  9.1   Hematocrit 39.0 - 52.0 % 31.2  28.9  28.7   Platelets 150 - 400 K/uL 345  293  274       Latest Ref Rng & Units 09/21/2023    1:47 AM 09/20/2023    1:59 AM 09/19/2023    3:46 AM  BMP  Glucose 70 - 99 mg/dL 882  896  85   BUN 8 - 23 mg/dL 25  23  20    Creatinine 0.61 - 1.24 mg/dL 9.29  9.19  9.34   Sodium 135 - 145 mmol/L 144  138  137   Potassium 3.5 - 5.1 mmol/L 4.4  4.3  4.5   Chloride 98 - 111 mmol/L 106  104  102   CO2 22 - 32 mmol/L 27  26  28    Calcium  8.9 - 10.3 mg/dL 8.6  8.2  8.3    No results found.   Procedures EGD: - Food in the lower third of the esophagus. Removal was  successful. Nasoenteric tube present with distal end in 2nd portion of duodenum. Removed and discarded. Benign-appearing esophageal stenosis at GE junction. Dilated to 15 mm. 5 cm hiatal hernia. Gastroesophageal flap valve classified as Hill  Grade IV (no fold, wide open lumen, hiatal hernia present)  Family Communication: no family at bedside.  Disposition: Status is: Inpatient Remains inpatient appropriate because: hypoxia, barium swallow, GI follow up, home o2 eval.  Planned Discharge Destination: Rehab     Time spent: 40 minutes  Author: Elvan Sor, MD 09/22/2023 12:18 PM Secure chat 7am to 7pm For on call review [email or web address].

## 2023-09-22 NOTE — Progress Notes (Signed)
   09/22/23 0951  Mobility  Activity Ambulated with assistance  Level of Assistance Contact guard assist, steadying assist  Assistive Device Front wheel walker  Distance Ambulated (ft) 40 ft  Activity Response Tolerated fair  Mobility Referral Yes  Mobility visit 1 Mobility  Mobility Specialist Start Time (ACUTE ONLY) 0951  Mobility Specialist Stop Time (ACUTE ONLY) 1008  Mobility Specialist Time Calculation (min) (ACUTE ONLY) 17 min   Mobility Specialist: Progress Note  Pre-Mobility:      HR 99, SpO2 92% 4L Post-Mobility:    HR 92, SpO2 80-90% 4L  Pt agreeable to mobility session - received in EOB. C/o SOB, VSS. Returned to bed with all needs met - call bell within reach. Bed alarm on.   Virgle Boards, BS Mobility Specialist Please contact via SecureChat or  Rehab office at [phone].

## 2023-09-23 DIAGNOSIS — R6521 Severe sepsis with septic shock: Secondary | ICD-10-CM | POA: Diagnosis not present

## 2023-09-23 DIAGNOSIS — A419 Sepsis, unspecified organism: Secondary | ICD-10-CM | POA: Diagnosis not present

## 2023-09-23 LAB — BASIC METABOLIC PANEL WITH GFR
Anion gap: 10 (ref 5–15)
BUN: 32 mg/dL — ABNORMAL HIGH (ref 8–23)
CO2: 27 mmol/L (ref 22–32)
Calcium: 8.4 mg/dL — ABNORMAL LOW (ref 8.9–10.3)
Chloride: 103 mmol/L (ref 98–111)
Creatinine, Ser: 0.94 mg/dL (ref 0.61–1.24)
GFR, Estimated: >60 mL/min (ref >60)
Glucose, Bld: 85 mg/dL (ref 70–99)
Potassium: 4.1 mmol/L (ref 3.5–5.1)
Sodium: 140 mmol/L (ref 135–145)

## 2023-09-23 LAB — MAGNESIUM: Magnesium: 1.7 mg/dL (ref 1.7–2.4)

## 2023-09-23 LAB — GLUCOSE, CAPILLARY
Glucose-Capillary: 103 mg/dL — ABNORMAL HIGH (ref 70–99)
Glucose-Capillary: 107 mg/dL — ABNORMAL HIGH (ref 70–99)
Glucose-Capillary: 114 mg/dL — ABNORMAL HIGH (ref 70–99)
Glucose-Capillary: 74 mg/dL (ref 70–99)
Glucose-Capillary: 79 mg/dL (ref 70–99)
Glucose-Capillary: 88 mg/dL (ref 70–99)
Glucose-Capillary: 93 mg/dL (ref 70–99)

## 2023-09-23 LAB — PHOSPHORUS: Phosphorus: 3.3 mg/dL (ref 2.5–4.6)

## 2023-09-23 LAB — BRAIN NATRIURETIC PEPTIDE: B Natriuretic Peptide: 187.1 pg/mL — ABNORMAL HIGH (ref 0.0–100.0)

## 2023-09-23 MED ORDER — MAGNESIUM SULFATE 2 GM/50ML IV SOLN
2.0000 g | Freq: Once | INTRAVENOUS | Status: AC
  Administered 2023-09-23: 2 g via INTRAVENOUS
  Filled 2023-09-23: qty 50

## 2023-09-23 MED ORDER — AMIODARONE HCL 200 MG PO TABS
200.0000 mg | ORAL_TABLET | Freq: Two times a day (BID) | ORAL | Status: DC
  Administered 2023-09-23 – 2023-09-26 (×12): 200 mg via ORAL
  Filled 2023-09-23 (×6): qty 1

## 2023-09-23 NOTE — Plan of Care (Signed)
  Problem: Education: Goal: Knowledge of General Education information will improve Description: Including pain rating scale, medication(s)/side effects and non-pharmacologic comfort measures Outcome: Progressing   Problem: Health Behavior/Discharge Planning: Goal: Ability to manage health-related needs will improve Outcome: Progressing   Problem: Clinical Measurements: Goal: Ability to maintain clinical measurements within normal limits will improve Outcome: Progressing Goal: Will remain free from infection Outcome: Progressing Goal: Diagnostic test results will improve Outcome: Progressing Goal: Respiratory complications will improve Outcome: Progressing Goal: Cardiovascular complication will be avoided Outcome: Progressing   Problem: Activity: Goal: Risk for activity intolerance will decrease Outcome: Progressing   Problem: Nutrition: Goal: Adequate nutrition will be maintained Outcome: Progressing   Problem: Coping: Goal: Level of anxiety will decrease Outcome: Progressing   Problem: Elimination: Goal: Will not experience complications related to bowel motility Outcome: Progressing Goal: Will not experience complications related to urinary retention Outcome: Progressing   Problem: Pain Managment: Goal: General experience of comfort will improve and/or be controlled Outcome: Progressing   Problem: Safety: Goal: Ability to remain free from injury will improve Outcome: Progressing   Problem: Skin Integrity: Goal: Risk for impaired skin integrity will decrease Outcome: Progressing   Problem: Activity: Goal: Ability to tolerate increased activity will improve Outcome: Progressing   Problem: Clinical Measurements: Goal: Ability to maintain a body temperature in the normal range will improve Outcome: Progressing   Problem: Respiratory: Goal: Ability to maintain adequate ventilation will improve Outcome: Progressing Goal: Ability to maintain a clear airway  will improve Outcome: Progressing   Problem: Education: Goal: Ability to describe self-care measures that may prevent or decrease complications (Diabetes Survival Skills Education) will improve Outcome: Progressing Goal: Individualized Educational Video(s) Outcome: Progressing   Problem: Coping: Goal: Ability to adjust to condition or change in health will improve Outcome: Progressing

## 2023-09-23 NOTE — Plan of Care (Signed)

## 2023-09-23 NOTE — Progress Notes (Signed)
 Progress Note   Patient: [name] FMW:[id number] DOB: February 12, 1961 DOA: 08/27/2023     27 DOS: the patient was seen and examined on 09/23/2023   Brief hospital course: [name] 63yo male smoker with hx HTN, COPD, PAF on eliquis , NICM, HFpEF presented 7/14 with 1 week progressive SOB, cough, malaise, poor appetite. On EMS arrival with hypoxic with sats 80% on RA. In ER initially BP was soft with SBP 80's, tachy 130's but responded well to 1L fluids. CXR revealed dense RUL PNA. He was admitted by [facility], started on IV rocephin /azithro. On 7/15 he was still boarding in ER and again having hypotension with SBP 80's and transferred to PCCM, s/p bronch as he required high flow O2. He is transferred to [facility] 7/30. PCCM on board. He is on 5L oxygen, has cortrak for feeding, not eating well.   Assessment and Plan:  #A-fib with RVR H/o PAF, flipped back to A-fib with RVR on 8/8 S/p Lopressor  5 mg IV x 1 dose, started Lopressor   25 mg p.o. twice daily 8/8 increased Lopressor  37.5 mg p.o. twice daily Continued amiodarone  200 mg pod 8/7 resumed Eliquis  Continue to monitor on telemetry Cardiology consulted, recommended to increase metoprolol , monitor on telemetry and mag repleted. 8/9 decreased Lopressor  25 mg p.o. twice daily due to soft blood pressure 8/10 increased amiodarone  200 mg p.o. twice daily, admitted AV nodal blocker agents due to soft BP. Follow cardiology for further recommendation    # Septic shock: Resolved Vasopressor medications weaned off.  Blood pressure stable.  Transferred from PCCM to [facility] 7/30.  Finished linezolid  plus meropenem  total 7 days.   # Acute hypoxic respiratory failure Continue supplemental O2 inhalation and gradually wean off 8/7 currently on 4 L oxygen via nasal cannula RN was advised to qualify him for home oxygen and inform to case manager to arrange oxygen for home use Planning to discharge him tomorrow or  a day after when stable. 8/6 respiratory  failure is gradually improving  # Community-acquired pneumonia, right upper lobe opacification. Patient completed antibiotics under PCCM service Patient needs to follow with pulmonology as an outpatient for further management and repeat imaging.  Will probably benefit from cancer screening and biopsy as an outpatient if needed   # COPD exacerbation Possible contributing etiology to hypoxia.  Continue nebulizers. 8/6 Mucinex  600 mg p.o. twice daily Tussionex prn for cough  # Acute HFpEF Likely contributing etiology to patient's hypoxia.  Responding well to IV diuresis.  Caution as his BP lower side.  Continue to monitor urine output.  Supplemental O2 as above.   # Dysphagia History of achalasia in the past.  Modified barium showing retention in the esophagus.  No overt vomiting at this time.  Continues on liquid diet.  Consulted GI recommended barium swallow. He is tolerating regular diet. Pending barium swallow, he will need outpatient GI follow up. GI consulted, s/p EGD:  - Food in the lower third of the esophagus. Removal was successful. Nasoenteric tube present with distal end in 2nd portion of duodenum. Removed and discarded. Benign-appearing esophageal stenosis at GE junction. Dilated to 15 mm. 5 cm hiatal hernia. 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  # Acute urinary retention: resolved  In&Out catheter 7/29 secondary to retention.  Continue bethanechol  10 mg po TID.  Plan to remove foley and do voiding trial. 8/7 discontinued Foley catheter, RN was advised to do bladder scan to follow voiding protocol and  inform if patient is retaining then we can reinsert Foley catheter. 8/8 passed voiding trial, patient denies any discomfort   Moderate protein calorie malnutrition Dietary on board.  Currently has cortrak in place receiving tube feeds. Pending barium swallow for further GI recs.   Physical debilitation muscle  weakness PT/OT evaluation done, recommended SNF placement but patient declined and he would like to go home with home health services.  Home health PT will be arranged by TOC.      Out of bed to chair. Incentive spirometry. Nursing supportive care. Fall, aspiration precautions. Diet:  Diet Orders (From admission, onward)     Start     Ordered   09/19/23 1310  DIET SOFT Fluid consistency: Thin  Diet effective now       Question:  Fluid consistency:  Answer:  Thin   09/19/23 1309           DVT prophylaxis:  apixaban  (ELIQUIS ) tablet 5 mg  Level of care: Med-Surg   Code Status: Full Code  Subjective: Patient was seen and examined at bedside.  Denied any chest pain or palpitations, breathing is improving, denies any worsening of symptoms.    Physical Exam: Vitals:   09/23/23 0849 09/23/23 0900 09/23/23 0939 09/23/23 1131  BP: (!) 82/50 (!) 92/56 (!) 92/56 (!) 108/95  Pulse: 96  96 (!) 101  Resp: 17     Temp: 98.7 F (37.1 C)   98.7 F (37.1 C)  TempSrc:    Oral  SpO2: 95%   95%  Weight:      Height:        General - Elderly ill Caucasian male, sleeping, no respiratory distress HEENT - PERRLA, EOMI, atraumatic head, non tender sinuses. Lung -equal air entry bilaterally, bilateral mild crackles, no wheezes Heart -irregular rhythm, no murmurs, no pedal edema. Abdomen - Soft, non tender, bowel sounds good. Neuro - Alert, awake and oriented, non focal exam. Skin - Warm and dry.  Data Reviewed:      Latest Ref Rng & Units 09/21/2023    1:47 AM 09/20/2023    1:59 AM 09/19/2023    3:46 AM  CBC  WBC 4.0 - 10.5 K/uL 7.0  6.3  6.6   Hemoglobin 13.0 - 17.0 g/dL 89.9  9.2  9.1   Hematocrit 39.0 - 52.0 % 31.2  28.9  28.7   Platelets 150 - 400 K/uL 345  293  274       Latest Ref Rng & Units 09/23/2023    5:21 AM 09/21/2023    1:47 AM 09/20/2023    1:59 AM  BMP  Glucose 70 - 99 mg/dL 85  882  896   BUN 8 - 23 mg/dL 32  25  23   Creatinine 0.61 - 1.24 mg/dL 9.05  9.29   9.19   Sodium 135 - 145 mmol/L 140  144  138   Potassium 3.5 - 5.1 mmol/L 4.1  4.4  4.3   Chloride 98 - 111 mmol/L 103  106  104   CO2 22 - 32 mmol/L 27  27  26    Calcium  8.9 - 10.3 mg/dL 8.4  8.6  8.2    No results found.   Procedures EGD: - Food in the lower third of the esophagus. Removal was successful. Nasoenteric tube present with distal end in 2nd portion of duodenum. Removed and discarded. Benign-appearing esophageal stenosis at GE junction. Dilated to 15 mm. 5 cm hiatal hernia. Gastroesophageal flap valve classified as Hill  Grade IV (no fold, wide open lumen, hiatal hernia present)  Family Communication: no family at bedside.  Disposition: Status is: Inpatient Remains inpatient appropriate because: hypoxia, barium swallow, GI follow up, home o2 eval.  Planned Discharge Destination: Home with Home Health and Rehab 8/10 can be DC'd home in 1 to 2 days when cleared by cardiology        Time spent: 40 minutes  Author: Elvan Sor, MD 09/23/2023 12:56 PM Secure chat 7am to 7pm For on call review [email or web address].

## 2023-09-23 NOTE — Progress Notes (Signed)
 Rounding Note   Patient Name: [name] Date of Encounter: 09/23/2023  [facility] Cardiologist: [facility]  Subjective No complaints  Scheduled Meds:  amiodarone   200 mg Oral Daily   apixaban   5 mg Oral BID   arformoterol   15 mcg Nebulization BID   ascorbic acid   500 mg Oral Daily   atorvastatin   40 mg Oral Daily   bethanechol   10 mg Oral TID   Chlorhexidine  Gluconate Cloth  6 each Topical Daily   feeding supplement  237 mL Oral TID BM   furosemide   40 mg Oral Daily   guaiFENesin   600 mg Oral BID   insulin  aspart  0-9 Units Subcutaneous Q4H   iron  polysaccharides  150 mg Oral Daily   metoprolol  tartrate  25 mg Oral BID   multivitamin with minerals  1 tablet Oral Daily   mouth rinse  15 mL Mouth Rinse 4 times per day   pantoprazole   40 mg Oral QAC breakfast   revefenacin   175 mcg Nebulization Daily   sodium chloride  flush  10-40 mL Intracatheter Q12H   thiamine   100 mg Oral Daily   Continuous Infusions:  PRN Meds: chlorpheniramine-HYDROcodone, diazepam , fluticasone , hydrOXYzine , ipratropium-albuterol , lip balm, loperamide , melatonin, mouth rinse, oxyCODONE -acetaminophen , phenol, polyethylene glycol, prochlorperazine , sodium chloride    Vital Signs  Vitals:   09/22/23 2029 09/23/23 0013 09/23/23 0357 09/23/23 0414  BP: 104/71 95/64 99/63    Pulse: 93 (!) 107 (!) 102   Resp: 18 18 18    Temp: 98.1 F (36.7 C) 99.1 F (37.3 C) 98.2 F (36.8 C)   TempSrc: Oral     SpO2: 95% 96% 95%   Weight:    76.3 kg  Height:        Intake/Output Summary (Last 24 hours) at 09/23/2023 0742 Last data filed at 09/23/2023 0354 Gross per 24 hour  Intake 266 ml  Output 2050 ml  Net -1784 ml      09/23/2023    4:14 AM 09/22/2023    7:04 AM 09/21/2023    5:00 AM  Last 3 Weights  Weight (lbs) 168 lb 3.4 oz 171 lb 12.8 oz 173 lb 1 oz  Weight (kg) 76.3 kg 77.928 kg 78.5 kg      Telemetry Afib rats 100-110s - Personally Reviewed  ECG  N/a - Personally  Reviewed  Physical Exam  GEN: No acute distress.   Neck: No JVD Cardiac: irreg Respiratory: Clear to auscultation bilaterally. GI: Soft, nontender, non-distended  MS: No edema; No deformity. Neuro:  Nonfocal  Psych: Normal affect   Labs High Sensitivity Troponin:   Recent Labs  Lab 08/27/23 1518 08/27/23 1937  TROPONINIHS 29* 42*     Chemistry Recent Labs  Lab 09/20/23 0159 09/21/23 0147 09/22/23 0146 09/23/23 0521  NA 138 144  --  140  K 4.3 4.4  --  4.1  CL 104 106  --  103  CO2 26 27  --  27  GLUCOSE 103* 117*  --  85  BUN 23 25*  --  32*  CREATININE 0.80 0.70  --  0.94  CALCIUM  8.2* 8.6*  --  8.4*  MG 1.7 1.8 1.7 1.7  GFRNONAA >60 >60  --  >60  ANIONGAP 8 11  --  10    Lipids No results for input(s): CHOL, TRIG, HDL, LABVLDL, LDLCALC, CHOLHDL in the last 168 hours.  Hematology Recent Labs  Lab 09/19/23 0346 09/20/23 0159 09/21/23 0147  WBC 6.6 6.3 7.0  RBC 3.37* 3.43* 3.73*  HGB 9.1* 9.2* 10.0*  HCT 28.7* 28.9* 31.2*  MCV 85.2 84.3 83.6  MCH 27.0 26.8 26.8  MCHC 31.7 31.8 32.1  RDW 20.6* 21.2* 20.9*  PLT 274 293 345   Thyroid No results for input(s): TSH, FREET4 in the last 168 hours.  BNP Recent Labs  Lab 09/23/23 0521  BNP 187.1*    DDimer No results for input(s): DDIMER in the last 168 hours.   Radiology  No results found.    Patient Profile   [name] is a 63 y.o. male with a hx of non-obstructive CAD, dilated cardiomyopathy with improved LVEF, paroxymal A fib/flutter on Eliquis  and amiodarone , s/p atrial flutter ablation 2017, COPD, tobacco use, dyslipidemia, HTN, who is being seen 09/21/2023 for the evaluation of A fib at the request of Dr Von   Assessment & Plan  1.PAF - long history of afib/aflutter, prior aflutter ablation 2017 at [facility] - on home amiodarone , coreg  - in setting of admission with septic shock, COPD exacberation, hypoxia, issues with afib with RVR. Has been paroxysmal afib this  admission - medical therapy with amio 200mg  daily, lopressor  25mg  bid, eliquis  5mg  bid.  Due to low bp's lopressor  lowered to 25mg  bid yesterday -AV nodal agents limited by soft bp's.  - will increase amio to 200mg  bid - with systemic illness would suspect some degree of tachycardia.     2. Chronic HFimpEF - - Historically felt due to ETOH use - LHC from 2017 at [facility] showed Moderate diffuse LAD and LCx disease with no targets for PCI; Severe diffuse disease of small caliber, co-dominant RCA - Echo from 08/28/23 showed LVEF 55-60%, no RWMA, normal RV, mod LAE, trivial AI  - some signs of fluid overload, continue gradual diuresis with oral lasix . Avoid aggressive diuresis given soft bp's   For questions or updates, please contact [facility] Please consult [email or web address] for contact info under     Signed, Alvan Carrier, MD  09/23/2023, 7:42 AM

## 2023-09-24 DIAGNOSIS — R6521 Severe sepsis with septic shock: Secondary | ICD-10-CM | POA: Diagnosis not present

## 2023-09-24 DIAGNOSIS — A419 Sepsis, unspecified organism: Secondary | ICD-10-CM | POA: Diagnosis not present

## 2023-09-24 LAB — GLUCOSE, CAPILLARY
Glucose-Capillary: 107 mg/dL — ABNORMAL HIGH (ref 70–99)
Glucose-Capillary: 125 mg/dL — ABNORMAL HIGH (ref 70–99)
Glucose-Capillary: 80 mg/dL (ref 70–99)
Glucose-Capillary: 92 mg/dL (ref 70–99)
Glucose-Capillary: 95 mg/dL (ref 70–99)
Glucose-Capillary: 98 mg/dL (ref 70–99)

## 2023-09-24 MED ORDER — METOPROLOL TARTRATE 12.5 MG HALF TABLET
12.5000 mg | ORAL_TABLET | Freq: Two times a day (BID) | ORAL | Status: DC
  Administered 2023-09-24 – 2023-09-25 (×6): 12.5 mg via ORAL
  Filled 2023-09-24 (×3): qty 1

## 2023-09-24 NOTE — Progress Notes (Signed)
 PROGRESS NOTE    [name]  FMW:[id number] DOB: 1960/11/13 DOA: 08/27/2023 PCP: Zena Frederick, DO   Brief Narrative:   63yo male smoker with hx HTN, COPD, PAF on eliquis , NICM, HFpEF presented 7/14 with 1 week progressive SOB, cough, malaise, poor appetite. On EMS arrival with hypoxic with sats 80% on RA. In ER initially BP was soft with SBP 80's, tachy 130's but responded well to 1L fluids. CXR revealed dense RUL PNA. He was admitted by [facility], started on IV rocephin /azithro. On 7/15 he was still boarding in ER and again having hypotension with SBP 80's and transferred to PCCM, s/p bronch as he required high flow O2. He was transferred to [facility] as of 7/30.  He is currently being managed for A-fib with RVR and cardiology is on board.  Blood pressure is soft.   Assessment & Plan:  Principal Problem:   Septic shock (HCC) Active Problems:   Esophageal dysphagia   Paroxysmal atrial fibrillation (HCC)   Stage 3 severe COPD by GOLD classification (HCC)   Malnutrition of moderate degree   Acute hypoxic respiratory failure (HCC)   Acute on chronic heart failure with preserved ejection fraction (HFpEF) (HCC)   Achalasia   Dysphagia   Protein-calorie malnutrition, severe   Abnormal esophagram   Stricture and stenosis of esophagus   Food impaction of esophagus   Atrial fibrillation with rapid ventricular response (HCC)    Paroxysmal A-fib with RVR:   Continue amiodarone  200 mg bid and metoprolol  25 mg bid. Continue  Eliquis  Continue to monitor on telemetry Cardiology on board. Bp is on the lower side      Septic shock: Resolved Vasopressor medications weaned off.  Blood pressure stable.  Transferred from PCCM to [facility] 7/30.  Finished linezolid  plus meropenem  total 7 days.   Acute hypoxic respiratory failure Continue supplemental O2 inhalation and gradually wean off Currently, he is  on 3-4 L oxygen via nasal cannula RN was advised to qualify him for home oxygen and inform to case  manager to arrange oxygen for home use   Community-acquired right upper lobe possible bacterial pneumonia, completed antibiotics under PCCM service Patient needs to follow with pulmonology as an outpatient for further management and repeat imaging.  Will probably benefit from cancer screening and biopsy as an outpatient if needed     COPD exacerbation Possible contributing etiology to hypoxia.  Continue nebulizers. Continue Mucinex  600 mg p.o. twice daily Tussionex prn for cough   Acute HFpEF: NYHA Class III, Improving Likely contributing etiology to patient's hypoxia.  Responding well to IV diuresis.  Caution as his BP lower side.  Continue to monitor urine output.  Supplemental O2 as above. Cardiology on board.   Dysphagia: History of achalasia in the past.  Modified barium showing retention in the esophagus.   GI consulted, s/p EGD:  - Food in the lower third of the esophagus. Removal was successful. Nasoenteric tube present with distal end in 2nd portion of duodenum. Removed and discarded. Benign-appearing esophageal stenosis at GE junction. Dilated to 15 mm. 5 cm hiatal hernia. 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   Acute urinary retention: resolved  In&Out catheter 7/29 secondary to retention.  Continue bethanechol  10 mg po TID.       Moderate protein calorie malnutrition Dietary on board.  Cortrack removed.   Physical debilitation/deconditioning:  PT/OT evaluation done, recommended SNF placement but patient declined and he would like to go home  with home health services.  Home health PT will be arranged by TOC.  DVT prophylaxis:  apixaban  (ELIQUIS ) tablet 5 mg     Code Status: Full Code Family Communication:   Status is: Inpatient Remains inpatient appropriate because: Afib with RVR    Subjective:  No acute events overnight.  Denies chest pain or shortness of breath.  We spoke about the management  of A-fib with RVR as patient is still in RVR with a heart rate ranging between 110s to 120s.  Examination:  General exam: Appears calm and comfortable, nasal oxygen cannula in place Respiratory system: Clear to auscultation. Respiratory effort normal. Cardiovascular system: Irregularly irregular rhythm, tachycardia, no rubs, gallops or clicks. No pedal edema. Gastrointestinal system: Abdomen is nondistended, soft and nontender. No organomegaly or masses felt. Normal bowel sounds heard. Central nervous system: Alert and oriented. No focal neurological deficits. Extremities: Symmetric 5 x 5 power. Skin: No rashes, lesions or ulcers Psychiatry: Judgement and insight appear normal. Mood & affect appropriate.       Diet Orders (From admission, onward)     Start     Ordered   09/19/23 1310  DIET SOFT Fluid consistency: Thin  Diet effective now       Question:  Fluid consistency:  Answer:  Thin   09/19/23 1309            Objective: Vitals:   09/24/23 0021 09/24/23 0504 09/24/23 0729 09/24/23 0736  BP: 100/67 115/64  107/75  Pulse: 100 89  (!) 107  Resp: 18 16    Temp: (!) 97.3 F (36.3 C) 97.9 F (36.6 C)  (!) 97.5 F (36.4 C)  TempSrc: Oral Oral    SpO2: 97% 92% 93% 94%  Weight:      Height:        Intake/Output Summary (Last 24 hours) at 09/24/2023 0945 Last data filed at 09/23/2023 2000 Gross per 24 hour  Intake 440 ml  Output 1050 ml  Net -610 ml   Filed Weights   09/21/23 0500 09/22/23 0704 09/23/23 0414  Weight: 78.5 kg 77.9 kg 76.3 kg    Scheduled Meds:  amiodarone   200 mg Oral BID   apixaban   5 mg Oral BID   arformoterol   15 mcg Nebulization BID   ascorbic acid   500 mg Oral Daily   atorvastatin   40 mg Oral Daily   bethanechol   10 mg Oral TID   Chlorhexidine  Gluconate Cloth  6 each Topical Daily   feeding supplement  237 mL Oral TID BM   furosemide   40 mg Oral Daily   guaiFENesin   600 mg Oral BID   insulin  aspart  0-9 Units Subcutaneous Q4H   iron   polysaccharides  150 mg Oral Daily   metoprolol  tartrate  25 mg Oral BID   multivitamin with minerals  1 tablet Oral Daily   mouth rinse  15 mL Mouth Rinse 4 times per day   pantoprazole   40 mg Oral QAC breakfast   revefenacin   175 mcg Nebulization Daily   sodium chloride  flush  10-40 mL Intracatheter Q12H   thiamine   100 mg Oral Daily   Continuous Infusions:  Nutritional status Signs/Symptoms: severe muscle depletion, moderate fat depletion, edema Interventions: Refer to RD note for recommendations Body mass index is 24.84 kg/m.  Data Reviewed:   CBC: Recent Labs  Lab 09/19/23 0346 09/20/23 0159 09/21/23 0147  WBC 6.6 6.3 7.0  HGB 9.1* 9.2* 10.0*  HCT 28.7* 28.9* 31.2*  MCV 85.2 84.3 83.6  PLT 274 293 345   Basic Metabolic Panel: Recent Labs  Lab 09/19/23 0346 09/19/23 0930 09/20/23 0159 09/21/23 0147 09/22/23 0146 09/23/23 0521  NA 137  --  138 144  --  140  K 4.5  --  4.3 4.4  --  4.1  CL 102  --  104 106  --  103  CO2 28  --  26 27  --  27  GLUCOSE 85  --  103* 117*  --  85  BUN 20  --  23 25*  --  32*  CREATININE 0.65  --  0.80 0.70  --  0.94  CALCIUM  8.3*  --  8.2* 8.6*  --  8.4*  MG  --  1.8 1.7 1.8 1.7 1.7  PHOS  --  3.9 3.7 3.2 3.4 3.3   GFR: Estimated Creatinine Clearance: 80.4 mL/min (by C-G formula based on SCr of 0.94 mg/dL). Liver Function Tests: No results for input(s): AST, ALT, ALKPHOS, BILITOT, PROT, ALBUMIN  in the last 168 hours. No results for input(s): LIPASE, AMYLASE in the last 168 hours. No results for input(s): AMMONIA in the last 168 hours. Coagulation Profile: No results for input(s): INR, PROTIME in the last 168 hours. Cardiac Enzymes: No results for input(s): CKTOTAL, CKMB, CKMBINDEX, TROPONINI in the last 168 hours. BNP (last 3 results) No results for input(s): PROBNP in the last 8760 hours. HbA1C: No results for input(s): HGBA1C in the last 72 hours. CBG: Recent Labs  Lab 09/23/23 1703  09/23/23 2032 09/23/23 2347 09/24/23 0352 09/24/23 0734  GLUCAP 74 103* 107* 80 92   Lipid Profile: No results for input(s): CHOL, HDL, LDLCALC, TRIG, CHOLHDL, LDLDIRECT in the last 72 hours. Thyroid Function Tests: No results for input(s): TSH, T4TOTAL, FREET4, T3FREE, THYROIDAB in the last 72 hours. Anemia Panel: No results for input(s): VITAMINB12, FOLATE, FERRITIN, TIBC, IRON , RETICCTPCT in the last 72 hours. Sepsis Labs: No results for input(s): PROCALCITON, LATICACIDVEN in the last 168 hours.  No results found for this or any previous visit (from the past 240 hours).       Radiology Studies: No results found.         LOS: 28 days   Time spent= 35 mins    Deliliah Room, MD [facility]  If 7PM-7AM, please contact night-coverage  09/24/2023, 9:45 AM

## 2023-09-24 NOTE — Progress Notes (Addendum)
 Progress Note  Patient Name: [name] Date of Encounter: 09/24/2023 [facility] Cardiologist: None   Interval Summary    No complaints this morning, hopeful to go home.   Vital Signs Vitals:   09/24/23 0021 09/24/23 0504 09/24/23 0729 09/24/23 0736  BP: 100/67 115/64  107/75  Pulse: 100 89  (!) 107  Resp: 18 16    Temp: (!) 97.3 F (36.3 C) 97.9 F (36.6 C)  (!) 97.5 F (36.4 C)  TempSrc: Oral Oral    SpO2: 97% 92% 93% 94%  Weight:      Height:        Intake/Output Summary (Last 24 hours) at 09/24/2023 1011 Last data filed at 09/23/2023 2000 Gross per 24 hour  Intake 440 ml  Output 1050 ml  Net -610 ml      09/23/2023    4:14 AM 09/22/2023    7:04 AM 09/21/2023    5:00 AM  Last 3 Weights  Weight (lbs) 168 lb 3.4 oz 171 lb 12.8 oz 173 lb 1 oz  Weight (kg) 76.3 kg 77.928 kg 78.5 kg      Telemetry/ECG   Atrial fibrillation rates 90-110 - Personally Reviewed  Physical Exam  GEN: No acute distress.  [place] @2L   Neck: No JVD Cardiac: Irreg Irreg, no murmurs, rubs, or gallops.  Respiratory: Clear to auscultation bilaterally. GI: Soft, nontender, non-distended  MS: No edema  Assessment & Plan   [name] is a 63 y.o. male with a hx of non-obstructive CAD, dilated cardiomyopathy with improved LVEF, paroxymal A fib/flutter on Eliquis  and amiodarone , s/p atrial flutter ablation 2017, COPD, tobacco use, dyslipidemia, HTN, who was seen 09/21/2023 for the evaluation of A fib at the request of Dr Von Sellar atrial fibrillation/flutter -- s/p prior ablation 2017 at [facility] -- developed atrial fibrillation with RVR in the setting of septic shock, COPD exacerbation -- currently on amiodarone  200mg  BID, Eliquis  5mg  BID. Ordered for metoprolol  25mg  BID but BP has been soft. Will reduce to 12.5mg  BID with hold parameters  Rapid HR not unexpected in the setting of Sepsis/SIRS --> rate control difficult with low BP.  If HR remains sustained > 100 - can bolus  IV Amio 150 mg for more rapid loading.  Hopefully Beta Blocker will help with rate control - careful to avoid over-diuresis leading to tachycardia  Chronic HFimEF -- has been felt to be 2/2 ETOH use -- Echo 08/2023 with LVEF of 55-60%, no rWMA, normal RV, moderate LAE and trivial AI -- no significant volume overload on exam -- on lasix  40mg  daily   Non-obstructive CAD -- cardiac cath 2017 with moderate diffuse LAD and LCx disease with no targets for PCI along with severe diffuse disease of small caliber, co-dominant RCA  -- continue statin, no ASA with need for [facility]  Per Primary Septic shock Acute Hypoxic respiratory failure CAP COPD Dysphagia Acute urinary retention  For questions or updates, please contact [facility] Please consult [email or web address] for contact info under       Signed, Manuelita Rummer, NP    ATTENDING ATTESTATION  I have seen, examined and evaluated the patient along with Manuelita Rummer, NP-C.  After reviewing all the available data and chart, we discussed the patients laboratory, study & physical findings as well as symptoms in detail.  I agree with her findings, examination as well as impression recommendations as per our discussion.    Attending adjustments noted in italics.   EF stable/ Seems mostly  euvolemic now -> careful to avoid over diuresis  Remains in Afib with Rates 90s-120s --> consider IV Amio 150 mg bolus to increase load to steady state; metoprolol  written for 12.5 mg BID.  If able to keep HR < 110 & otherwise medically  & hemodynamically stable, could consider continued Rx with BB & Amio along with Eliquis  & consider DCCV in OP after 4 wks DOAC if remains in Afib.      Alm MICAEL Clay, MD, MS Alm Clay, M.D., M.S. Interventional Chartered certified accountant  Pager # [phone]

## 2023-09-24 NOTE — Progress Notes (Signed)
 Physical Therapy Treatment Patient Details Name: [name] MRN: [id number] DOB: 1960-04-18 Today's Date: 09/24/2023   History of Present Illness Pt is a 63 yr old male who presented 08/27/23 due to SOB, decrease in appetite. Pt the went into septic shock and was transferred into ICU and transferred out on 09/02/23.  Increased O2 requirement on 7/25 and noted chest CT with cavitation of R UL suggestive of necrosis so pt underwent bronchoscopy and transferred back to ICU with HHFNC to 35L 94% FiO2. PMH: HTN, COPD, PAF on eliquis , NICM, HFpEF    PT Comments  Pt reports frustration with elevated HR impeding his ability to go home, as well as not being able to get any rest, reporting his rest has been interrupted since 4:00 this morning. PT acknowledged frustration, but encouraged participation with therapy. Eventually able to compromise to bed level exercises. D/c plans remain appropriate. PT will continue to follow acutely.     If plan is discharge home, recommend the following: Help with stairs or ramp for entrance;Assistance with cooking/housework;Assist for transportation;A lot of help with walking and/or transfers;A lot of help with bathing/dressing/bathroom   Can travel by private vehicle     No  Equipment Recommendations  Other (comment) (TBA)       Precautions / Restrictions Precautions Precautions: Fall Precaution/Restrictions Comments: watch SpO2 Restrictions Weight Bearing Restrictions Per Provider Order: No           Communication Communication Communication: No apparent difficulties  Cognition Arousal: Alert Behavior During Therapy: Flat affect   PT - Cognitive impairments: No apparent impairments                       PT - Cognition Comments: frustrated by not being able to discharge due ot Afib RVR Following commands: Intact      Cueing Cueing Techniques: Verbal cues  Exercises General Exercises - Lower Extremity Ankle Circles/Pumps: AROM, Both, 10  reps, Supine Quad Sets: AROM, Both, 10 reps, Supine Gluteal Sets: AROM, Both, 10 reps, Supine Short Arc Quad: AROM, Both, 10 reps, Supine Heel Slides: AROM, Both, 10 reps, Supine Hip ABduction/ADduction: AROM, Both, 10 reps, Supine Straight Leg Raises: AROM, Both, 10 reps, Supine    General Comments  HR 115-121 bpm while resting in supine, with exercise HR min 120s. SpO2 >95%O2 on 3L O2 via .       Pertinent Vitals/Pain Pain Assessment Pain Assessment: Faces Faces Pain Scale: Hurts little more Pain Location: stomach Pain Descriptors / Indicators: Discomfort, Grimacing, Sore Pain Intervention(s): Limited activity within patient's tolerance, Monitored during session, Repositioned     PT Goals (current goals can now be found in the care plan section) Acute Rehab PT Goals Patient Stated Goal: to go home PT Goal Formulation: With patient Time For Goal Achievement: 10/03/23 Potential to Achieve Goals: Fair Progress towards PT goals: Not progressing toward goals - comment;Progressing toward goals    Frequency    Min 2X/week       AM-PAC PT 6 Clicks Mobility   Outcome Measure  Help needed turning from your back to your side while in a flat bed without using bedrails?: A Little Help needed moving from lying on your back to sitting on the side of a flat bed without using bedrails?: A Little Help needed moving to and from a bed to a chair (including a wheelchair)?: A Little Help needed standing up from a chair using your arms (e.g., wheelchair or bedside chair)?: A Little Help needed to  walk in hospital room?: A Little Help needed climbing 3-5 steps with a railing? : Total 6 Click Score: 16    End of Session Equipment Utilized During Treatment: Gait belt Activity Tolerance: Patient tolerated treatment well Patient left: in bed;with call bell/phone within reach;with bed alarm set;with nursing/sitter in room Nurse Communication: Mobility status PT Visit Diagnosis: Muscle  weakness (generalized) (M62.81);Other abnormalities of gait and mobility (R26.89);Unsteadiness on feet (R26.81);Difficulty in walking, not elsewhere classified (R26.2)     Time: 8294-8282 PT Time Calculation (min) (ACUTE ONLY): 12 min  Charges:    $Therapeutic Exercise: 8-22 mins PT General Charges $$ ACUTE PT VISIT: 1 Visit                     Ennifer Harston B. Fleeta Lapidus PT, DPT Acute Rehabilitation Services Please use secure chat or  Call Office [phone]    [name] [facility] 09/24/2023, 5:29 PM

## 2023-09-24 NOTE — Plan of Care (Signed)

## 2023-09-24 NOTE — Progress Notes (Signed)
 Mobility Specialist: Progress Note   09/24/23 1500  Mobility  Activity Ambulated with assistance  Level of Assistance Contact guard assist, steadying assist  Assistive Device Front wheel walker  Distance Ambulated (ft) 60 ft  Activity Response Tolerated well  Mobility Referral Yes  Mobility visit 1 Mobility  Mobility Specialist Start Time (ACUTE ONLY) 1538  Mobility Specialist Stop Time (ACUTE ONLY) 1547  Mobility Specialist Time Calculation (min) (ACUTE ONLY) 9 min    Pre Mobility: SpO2 94% 3LO2 During mobility: SpO2 91-93% 3LO2, HR 112-118 Post Mobility: SpO2 94% 3LO2   Pt received in bed, pleasant and agreeable to mobility session. SV for bed mobility and STS.  CGA for ambulation. Feeling winded but otherwise no complaints. SpO2 WFL on 3LO2. Returned to room without fault. Left on EOB with all needs met, call bell in reach.   Ileana Lute Mobility Specialist Please contact via SecureChat or Rehab office at [phone]

## 2023-09-25 DIAGNOSIS — E43 Unspecified severe protein-calorie malnutrition: Secondary | ICD-10-CM

## 2023-09-25 LAB — BASIC METABOLIC PANEL WITH GFR
Anion gap: 8 (ref 5–15)
BUN: 21 mg/dL (ref 8–23)
CO2: 28 mmol/L (ref 22–32)
Calcium: 8.3 mg/dL — ABNORMAL LOW (ref 8.9–10.3)
Chloride: 103 mmol/L (ref 98–111)
Creatinine, Ser: 0.83 mg/dL (ref 0.61–1.24)
GFR, Estimated: >60 mL/min (ref >60)
Glucose, Bld: 116 mg/dL — ABNORMAL HIGH (ref 70–99)
Potassium: 3.5 mmol/L (ref 3.5–5.1)
Sodium: 139 mmol/L (ref 135–145)

## 2023-09-25 LAB — CBC
HCT: 32.2 % — ABNORMAL LOW (ref 39.0–52.0)
Hemoglobin: 10.2 g/dL — ABNORMAL LOW (ref 13.0–17.0)
MCH: 27.2 pg (ref 26.0–34.0)
MCHC: 31.7 g/dL (ref 30.0–36.0)
MCV: 85.9 fL (ref 80.0–100.0)
Platelets: 323 K/uL (ref 150–400)
RBC: 3.75 MIL/uL — ABNORMAL LOW (ref 4.22–5.81)
RDW: 21.8 % — ABNORMAL HIGH (ref 11.5–15.5)
WBC: 6.9 K/uL (ref 4.0–10.5)
nRBC: 0 % (ref 0.0–0.2)

## 2023-09-25 LAB — GLUCOSE, CAPILLARY
Glucose-Capillary: 86 mg/dL (ref 70–99)
Glucose-Capillary: 75 mg/dL (ref 70–99)
Glucose-Capillary: 88 mg/dL (ref 70–99)
Glucose-Capillary: 132 mg/dL — ABNORMAL HIGH (ref 70–99)
Glucose-Capillary: 93 mg/dL (ref 70–99)

## 2023-09-25 MED ORDER — FUROSEMIDE 20 MG PO TABS: 20.0000 mg | ORAL_TABLET | Freq: Every day | ORAL | Status: DC

## 2023-09-25 NOTE — Plan of Care (Signed)

## 2023-09-25 NOTE — Progress Notes (Signed)
 Occupational Therapy Treatment  Patient Details Name: [name] MRN: [id number] DOB: [birth year] Today's Date: 09/25/2023   History of Present Illness   Pt is a 63 yr old male who presented 08/27/23 due to SOB, decrease in appetite. Pt the went into septic shock and was transferred into ICU and transferred out on 09/02/23.  Increased O2 requirement on 7/25 and noted chest CT with cavitation of R UL suggestive of necrosis so pt underwent bronchoscopy and transferred back to ICU with HHFNC to 35L 94% FiO2. PMH: HTN, COPD, PAF on eliquis , NICM, HFpEF     Clinical Impressions Pt progressing slowly toward established OT goals, with low BP today. Pt goals extended secondary to slow progress. Pt performing bed mobility with CGA and BP at EOB 82/66 (76), thus, limited session today to STS transfers and EOB grooming tasks. Educated pt regarding importance of upright positioning and mobility. Will continue to follow. Patient will benefit from continued inpatient follow up therapy, <3 hours/day      If plan is discharge home, recommend the following:   A lot of help with bathing/dressing/bathroom;Assistance with cooking/housework;Assist for transportation;Help with stairs or ramp for entrance;A little help with walking and/or transfers     Functional Status Assessment         Equipment Recommendations   BSC/3in1     Recommendations for Other Services         Precautions/Restrictions   Precautions Precautions: Fall Precaution/Restrictions Comments: watch SpO2 Restrictions Weight Bearing Restrictions Per Provider Order: No     Mobility Bed Mobility Overal bed mobility: Needs Assistance Bed Mobility: Supine to Sit, Sit to Supine     Supine to sit: Contact guard, HOB elevated, Used rails Sit to supine: Contact guard assist, HOB elevated, Used rails   General bed mobility comments: CGA for safety    Transfers Overall transfer level: Needs assistance Equipment used:  Rolling walker (2 wheels) Transfers: Sit to/from Stand, Bed to chair/wheelchair/BSC Sit to Stand: Contact guard assist Stand pivot transfers: Contact guard assist         General transfer comment: good power up and self steady in RW      Balance Overall balance assessment: Needs assistance Sitting-balance support: No upper extremity supported, Feet supported Sitting balance-[name]: Fair     Standing balance support: Bilateral upper extremity supported, During functional activity Standing balance-[name]: Poor Standing balance comment: reliant on UE support                           ADL either performed or assessed with clinical judgement   ADL Overall ADL's : Needs assistance/impaired     Grooming: Wash/dry hands;Wash/dry face;Brushing hair;Set up;Sitting Grooming Details (indicate cue type and reason): on EOB             Lower Body Dressing: Minimal assistance;Sitting/lateral leans Lower Body Dressing Details (indicate cue type and reason): to pull up socks Toilet Transfer: Contact guard assist;Stand-pivot                   Vision         Perception         Praxis         Pertinent Vitals/Pain Pain Assessment Pain Assessment: Faces Faces Pain Scale: Hurts even more Pain Location: stomach Pain Descriptors / Indicators: Discomfort, Grimacing, Sore Pain Intervention(s): Limited activity within patient's tolerance, Monitored during session     Extremity/Trunk Assessment Upper Extremity Assessment Upper Extremity Assessment:  Generalized weakness   Lower Extremity Assessment Lower Extremity Assessment: Defer to PT evaluation       Communication Communication Communication: No apparent difficulties   Cognition Arousal: Alert Behavior During Therapy: Flat affect Cognition: Cognition impaired     Awareness: Intellectual awareness intact, Online awareness impaired   Attention impairment (select first level of  impairment): Selective attention Executive functioning impairment (select all impairments): Reasoning, Problem solving OT - Cognition Comments: alert and oriented x4; benefits from education to optimize health literacy in current state. pt with decreased knowledge of compensatory techniques to elevate BP as well as role of mobility in recovery.                 Following commands: Intact       Cueing  General Comments   Cueing Techniques: Verbal cues  SpO2 >90 on 3L supplemental O2; Pt HR up to 120s with bed mobility, BP checked at EOB and 82/66 with pt denying dizziness but reports he does not feel well.   Exercises Exercises: General Lower Extremity General Exercises - Lower Extremity Ankle Circles/Pumps: AROM, Both, 10 reps, Supine Quad Sets: AROM, Both, 10 reps, Seated Long Arc Quad: AROM, 10 reps, Seated Other Exercises Other Exercises: educated regarding counter pressure techniques to elevate BP prior to transfers.   Shoulder Instructions      Home Living                                          Prior Functioning/Environment                      OT Problem List:     OT Treatment/Interventions:        OT Goals(Current goals can be found in the care plan section)   Acute Rehab OT Goals Patient Stated Goal: go home OT Goal Formulation: With patient Time For Goal Achievement: 10/09/23 Potential to Achieve Goals: Good   OT Frequency:  Min 2X/week    Co-evaluation              AM-PAC OT 6 Clicks Daily Activity     Outcome Measure Help from another person eating meals?: A Little Help from another person taking care of personal grooming?: A Little Help from another person toileting, which includes using toliet, bedpan, or urinal?: A Lot Help from another person bathing (including washing, rinsing, drying)?: A Lot Help from another person to put on and taking off regular upper body clothing?: A Little Help from another  person to put on and taking off regular lower body clothing?: A Lot 6 Click Score: 15   End of Session Equipment Utilized During Treatment: Gait belt;Rolling walker (2 wheels) Nurse Communication: Mobility status  Activity Tolerance: Patient tolerated treatment well Patient left: in bed;with call bell/phone within reach  OT Visit Diagnosis: Unsteadiness on feet (R26.81);Other abnormalities of gait and mobility (R26.89);Repeated falls (R29.6);Muscle weakness (generalized) (M62.81);History of falling (Z91.81);Pain                Time: 1457-1516 OT Time Calculation (min): 19 min Charges:  OT General Charges $OT Visit: 1 Visit OT Treatments $Self Care/Home Management : 8-22 mins  Elma JONETTA Lebron FREDERICK, OTR/L [facility] Acute Rehabilitation Office: [phone]   Elma JONETTA Lebron 09/25/2023, 4:06 PM

## 2023-09-25 NOTE — Progress Notes (Signed)
 Nutrition Follow-up  DOCUMENTATION CODES:   Severe malnutrition in context of chronic illness  INTERVENTION:  -Continue Soft diet as ordered -Continue Ensure Plus High Protein TID  -Continue MVI w/min -Continue to encourage PO intake  NUTRITION DIAGNOSIS:   Severe Malnutrition related to chronic illness, acute illness, dysphagia, poor appetite as evidenced by severe muscle depletion, moderate fat depletion, edema.  Ongoing  GOAL:   Patient will meet greater than or equal to 90% of their needs  Progressing  MONITOR:   PO intake, Weight trends, Supplement acceptance, Labs, TF tolerance, Skin  REASON FOR ASSESSMENT:   Consult Enteral/tube feeding initiation and management  ASSESSMENT:   Pt with hx of former alcohol abuse and tobacco abuse, CHF, COPD, HTN, HLD, and atrial fibrillation presented to ED with worsening SOB and poor appetite. Found to be septic on admission related to PNA.  Spoke to pt at bedside. Weight continues to trend downward, though, seems to be stabilizing. No noted or stated v/c/d or chewing/swallowing difficulties since last review. Last BM 8/11. Pt does endorse some nausea today, though, is still trying to eat. Pt states he has been eating well previously, drinking Ensure TID. Pt denies questions/concerns/needs at this time. No changes to nutritional POC, will continue to monitor, RDN available prn.   Labs BUN 32 Ca 8.4 Albumin  2.1 ALT 50  Medications  amiodarone   200 mg Oral BID   apixaban   5 mg Oral BID   arformoterol   15 mcg Nebulization BID   ascorbic acid   500 mg Oral Daily   atorvastatin   40 mg Oral Daily   bethanechol   10 mg Oral TID   Chlorhexidine  Gluconate Cloth  6 each Topical Daily   feeding supplement  237 mL Oral TID BM   furosemide   40 mg Oral Daily   guaiFENesin   600 mg Oral BID   insulin  aspart  0-9 Units Subcutaneous Q4H   iron  polysaccharides  150 mg Oral Daily   metoprolol  tartrate  12.5 mg Oral BID   multivitamin with  minerals  1 tablet Oral Daily   mouth rinse  15 mL Mouth Rinse 4 times per day   pantoprazole   40 mg Oral QAC breakfast   revefenacin   175 mcg Nebulization Daily   sodium chloride  flush  10-40 mL Intracatheter Q12H   thiamine   100 mg Oral Daily    NUTRITION - FOCUSED PHYSICAL EXAM:  Flowsheet Row Most Recent Value  Orbital Region Moderate depletion  Upper Arm Region Mild depletion  Thoracic and Lumbar Region Mild depletion  Buccal Region Moderate depletion  Temple Region Severe depletion  Clavicle Bone Region Severe depletion  Clavicle and Acromion Bone Region Severe depletion  Scapular Bone Region Moderate depletion  Dorsal Hand Moderate depletion  Patellar Region Moderate depletion  Anterior Thigh Region Moderate depletion  Posterior Calf Region Moderate depletion  Edema (RD Assessment) Mild  Hair Reviewed  Eyes Reviewed  Mouth Reviewed  Skin Reviewed  Nails Reviewed    Diet Order:   Diet Order             DIET SOFT Fluid consistency: Thin  Diet effective now                   EDUCATION NEEDS:   Education needs have been addressed  Skin:  Skin Assessment: Reviewed RN Assessment  Last BM:  8/3  Height:   Ht Readings from Last 1 Encounters:  08/27/23 5' 9 (1.753 m)    Weight:   Wt Readings  from Last 1 Encounters:  09/25/23 76.6 kg    Ideal Body Weight:  72.7 kg  BMI:  Body mass index is 24.94 kg/m.  Estimated Nutritional Needs:   Kcal:  2000-2200 kcal/d  Protein:  100-120 g/d  Fluid:  2-2.2L/d  Kern Gingras Daml-Budig, RDN, LDN Registered Dietitian Nutritionist RD Inpatient Contact Info in [place]

## 2023-09-25 NOTE — Progress Notes (Addendum)
 Progress Note  Patient Name: [name] Date of Encounter: 09/25/2023 [facility] Cardiologist: None   Interval Summary    No complaints, feels weaker today.   Vital Signs Vitals:   09/25/23 0500 09/25/23 0728 09/25/23 0818 09/25/23 0900  BP:   (!) 85/50 (!) 81/54  Pulse:   100   Resp:      Temp:   97.6 F (36.4 C)   TempSrc:   Oral   SpO2:  98% 92%   Weight: 76.6 kg     Height:        Intake/Output Summary (Last 24 hours) at 09/25/2023 1215 Last data filed at 09/24/2023 2340 Gross per 24 hour  Intake 340 ml  Output 300 ml  Net 40 ml      09/25/2023    5:00 AM 09/23/2023    4:14 AM 09/22/2023    7:04 AM  Last 3 Weights  Weight (lbs) 168 lb 14 oz 168 lb 3.4 oz 171 lb 12.8 oz  Weight (kg) 76.6 kg 76.3 kg 77.928 kg      Telemetry/ECG  Atrial fibrillation rates 80-100 - Personally Reviewed  Physical Exam  GEN: No acute distress.   Neck: No JVD Cardiac: Irreg Irreg, no murmurs, rubs, or gallops.  Respiratory: Clear to auscultation bilaterally. GI: Soft, nontender, non-distended  MS: No edema  Assessment & Plan   [name] is a 63 y.o. male with a hx of non-obstructive CAD, dilated cardiomyopathy with improved LVEF, paroxymal A fib/flutter on Eliquis  and amiodarone , s/p atrial flutter ablation 2017, COPD, tobacco use, dyslipidemia, HTN, who was seen 09/21/2023 for the evaluation of A fib at the request of Dr Von Sellar atrial fibrillation/flutter -- s/p prior ablation 2017 at [facility] -- developed atrial fibrillation with RVR in the setting of septic shock, COPD exacerbation -- currently on amiodarone  200mg  BID, Eliquis  5mg  BID. Ordered for metoprolol  12.5mg  BID but BP has been soft therefore held. May consider midodrine if BP remains low  -- rates improved today, mostly between 80-100   Chronic HFimEF -- has been felt to be 2/2 ETOH use -- Echo 08/2023 with LVEF of 55-60%, no rWMA, normal RV, moderate LAE and trivial AI -- no significant  volume overload on exam, net - 8.7L -- on lasix  40mg  daily (held this morning) decreased PO intake per RN, will reduce to lasix  20mg  daily  -- recheck BMET today   Non-obstructive CAD -- cardiac cath 2017 with moderate diffuse LAD and LCx disease with no targets for PCI along with severe diffuse disease of small caliber, co-dominant RCA  -- continue statin, no ASA with need for [facility]   Per Primary Septic shock Acute Hypoxic respiratory failure CAP COPD Dysphagia Acute urinary retention  For questions or updates, please contact [facility] Please consult [email or web address] for contact info under       Signed, Manuelita Rummer, NP    ATTENDING ATTESTATION  I have seen, examined and evaluated the patient along with Manuelita Rummer, NP-C.  After reviewing all the available data and chart, we discussed the patients laboratory, study & physical findings as well as symptoms in detail.  I agree with her findings, examination as well as impression recommendations as per our discussion.    Doing a little better today with heart rate.  Had a drop earlier this morning that preceded the drop in blood pressure, now blood pressure is trending back up again. Hold parameters for beta-blocker were changed yesterday, did not receive this  morning.  Agree with holding diuretic today and reducing dose for tomorrow.  Plan for discharge will be to complete total of 2 weeks of amiodarone  200 twice daily followed by 200 mg daily.  If blood pressure tolerates for discharge, would be on 12.5 mg twice daily metoprolol  tartrate.  Anticoagulation with Eliquis   He would need close follow-up with his cardiologist and can consider outpatient DCCV after total of 4 weeks of uninterrupted DOAC (please annotate in discharge summary for initiation date of Eliquis  for this purpose)  We will monitor at a distance at this point.    Alm MICAEL Clay, MD, MS Alm Clay, M.D., M.S. Interventional Actor  Pager # [phone]

## 2023-09-25 NOTE — Progress Notes (Addendum)
 Progress Note   Patient: [name] FMW:[id number] DOB: Sep 29, 1960 DOA: 08/27/2023     29 DOS: the patient was seen and examined on 09/25/2023   Brief hospital course: 63yo male smoker with hx HTN, COPD, PAF on eliquis , NICM, HFpEF presented 7/14 with 1 week progressive SOB, cough, malaise, poor appetite. On EMS arrival with hypoxic with sats 80% on RA. In ER initially BP was soft with SBP 80's, tachy 130's but responded well to 1L fluids. CXR revealed dense RUL PNA. He was admitted by [facility], started on IV rocephin /azithro. On 7/15 he was still boarding in ER and again having hypotension with SBP 80's and transferred to PCCM, s/p bronch as he required high flow O2. He was transferred to [facility] as of 7/30.   He is currently being managed for A-fib with RVR and cardiology is on board.     08/12 improved heart rate on amiodarone  and metoprolol  Close follow up on blood pressure.   Septic shock: Resolved Transferred from PCCM to [facility] 7/30.   Finished linezolid  plus meropenem  total 7 days.   Acute hypoxic respiratory failure Today 02 saturation is 98% on room air.  Dyspnea has improved.    Community-acquired right upper lobe possible bacterial pneumonia, completed antibiotics under PCCM service Patient needs to follow with pulmonology as an outpatient for further management and repeat imaging.  Will probably benefit from cancer screening and biopsy as an outpatient if needed   COPD exacerbation Possible contributing etiology to hypoxia.  Continue nebulizers. Continue Mucinex  600 mg p.o. twice daily Tussionex prn for cough   Acute HFpEF: NYHA Class III,  Echocardiogram with preserved LV systolic function with EF 55 to 60%, RV systolic function preserved, LA with moderate dilatation, no significant valvular disease. Since admission volume balance is negative - 8,790 ml  Plan to continue diuresis with furosemide , reduced dose to 20 mg due to risk of hypotension.  Continue metoprolol     Paroxysmal atrial fibrillation   Improved rate control, plan to continue amiodarone  200 mg bid, and metoprolol  12.5 mg po bid.  Anticoagulation with apixaban .   Dysphagia: History of achalasia in the past.  Modified barium showing retention in the esophagus.   GI consulted, s/p EGD:  - Food in the lower third of the esophagus. Removal was successful. Nasoenteric tube present with distal end in 2nd portion of duodenum. Removed and discarded. Benign-appearing esophageal stenosis at GE junction. Dilated to 15 mm. 5 cm hiatal hernia. 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   Acute urinary retention: resolved  In&Out catheter 7/29 secondary to retention.  Continue bethanechol  10 mg po TID.     Moderate protein calorie malnutrition Dietary on board.  Cortrack removed.   Assessment and Plan: No notes have been filed under this hospital service. Service: Hospitalist       Subjective: Patient is feeling well, no chest pain and no dyspnea, no dizziness or lightheadedness.   Physical Exam: Vitals:   09/25/23 0728 09/25/23 0818 09/25/23 0900 09/25/23 1222  BP:  (!) 85/50 (!) 81/54 93/80  Pulse:  100  (!) 102  Resp:      Temp:  97.6 F (36.4 C)  97.7 F (36.5 C)  TempSrc:  Oral  Oral  SpO2: 98% 92%  98%  Weight:      Height:       Neurology awake and alert ENT with mild pallor with no icterus Cardiovascular with S1 and S2 present  and irregularly irregular with no gallops or rubs Respiratory with no wheezing or rhonchi, no rales Abdomen with no distention  Trace lower extremity edema  Data Reviewed:    Family Communication: no family at the bedside   Disposition: Status is: Inpatient Remains inpatient appropriate because: recovering from heart failure   Planned Discharge Destination: Home     Author: Elidia Toribio Furnace, MD 09/25/2023 3:18 PM  For on call review [email or web address].

## 2023-09-25 NOTE — Hospital Course (Addendum)
 63yo male smoker with hx HTN, COPD, PAF on eliquis , NICM, HFpEF presented 7/14 with 1 week progressive SOB, cough, malaise, poor appetite. On EMS arrival with hypoxic with sats 80% on RA. In ER initially BP was soft with SBP 80's, tachy 130's but responded well to 1L fluids. CXR revealed dense RUL PNA. He was admitted by [facility], started on IV rocephin /azithro. On 7/15 he was still boarding in ER and again having hypotension with SBP 80's and transferred to PCCM, s/p bronch as he required high flow O2. He was transferred to [facility] as of 7/30.   He is currently being managed for A-fib with RVR and cardiology is on board.     08/12 improved heart rate on amiodarone  and metoprolol  Close follow up on blood pressure.   Septic shock: Resolved Transferred from PCCM to [facility] 7/30.   Finished linezolid  plus meropenem  total 7 days.   Acute hypoxic respiratory failure Today 02 saturation is 98% on room air.  Dyspnea has improved.    Community-acquired right upper lobe possible bacterial pneumonia, completed antibiotics under PCCM service Patient needs to follow with pulmonology as an outpatient for further management and repeat imaging.  Will probably benefit from cancer screening and biopsy as an outpatient if needed   COPD exacerbation Possible contributing etiology to hypoxia.  Continue nebulizers. Continue Mucinex  600 mg p.o. twice daily Tussionex prn for cough   Acute HFpEF: NYHA Class III,  Echocardiogram with preserved LV systolic function with EF 55 to 60%, RV systolic function preserved, LA with moderate dilatation, no significant valvular disease. Since admission volume balance is negative - 8,790 ml  Plan to continue diuresis with furosemide , reduced dose to 20 mg due to risk of hypotension.  Continue metoprolol    Paroxysmal atrial fibrillation   Improved rate control, plan to continue amiodarone  200 mg bid, and metoprolol  12.5 mg po bid.  Anticoagulation with apixaban .   Dysphagia: History  of achalasia in the past.  Modified barium showing retention in the esophagus.   GI consulted, s/p EGD:  - Food in the lower third of the esophagus. Removal was successful. Nasoenteric tube present with distal end in 2nd portion of duodenum. Removed and discarded. Benign-appearing esophageal stenosis at GE junction. Dilated to 15 mm. 5 cm hiatal hernia. 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   Acute urinary retention: resolved  In&Out catheter 7/29 secondary to retention.  Continue bethanechol  10 mg po TID.     Moderate protein calorie malnutrition Dietary on board.  Cortrack removed.   Anxiety continue as needed diazepam  and hydroxyzine 

## 2023-09-26 ENCOUNTER — Encounter: Payer: Self-pay | Admitting: Internal Medicine

## 2023-09-26 LAB — GLUCOSE, CAPILLARY
Glucose-Capillary: 82 mg/dL (ref 70–99)
Glucose-Capillary: 72 mg/dL (ref 70–99)

## 2023-09-26 MED ORDER — AMIODARONE HCL 200 MG PO TABS: 200.0000 mg | ORAL_TABLET | Freq: Two times a day (BID) | ORAL | 0 refills | Status: DC

## 2023-09-26 MED ORDER — ADULT MULTIVITAMIN W/MINERALS CH: 1.0000 | ORAL_TABLET | Freq: Every day | ORAL | 0 refills | Status: DC

## 2023-09-26 MED ORDER — ATORVASTATIN CALCIUM 40 MG PO TABS: 40.0000 mg | ORAL_TABLET | Freq: Every day | ORAL | 0 refills | Status: DC

## 2023-09-26 MED ORDER — BETHANECHOL CHLORIDE 10 MG PO TABS: 10.0000 mg | ORAL_TABLET | Freq: Three times a day (TID) | ORAL | 0 refills | Status: DC

## 2023-09-26 MED ORDER — POLYSACCHARIDE IRON COMPLEX 150 MG PO CAPS: 150.0000 mg | ORAL_CAPSULE | Freq: Every day | ORAL | 0 refills | Status: DC

## 2023-09-26 MED ORDER — REVEFENACIN 175 MCG/3ML IN SOLN: 175.0000 ug | Freq: Every day | RESPIRATORY_TRACT | 0 refills | Status: DC

## 2023-09-26 MED ORDER — OXYCODONE-ACETAMINOPHEN 5-325 MG PO TABS: 1.0000 | ORAL_TABLET | Freq: Four times a day (QID) | ORAL | 0 refills | Status: DC | PRN

## 2023-09-26 MED ORDER — ASCORBIC ACID 500 MG PO TABS: 500.0000 mg | ORAL_TABLET | Freq: Every day | ORAL | 0 refills | Status: DC

## 2023-09-26 MED ORDER — DIAZEPAM 5 MG PO TABS: 5.0000 mg | ORAL_TABLET | Freq: Two times a day (BID) | ORAL | 0 refills | Status: DC | PRN

## 2023-09-26 MED ORDER — VITAMIN B-1 100 MG PO TABS: 100.0000 mg | ORAL_TABLET | Freq: Every day | ORAL | 0 refills | Status: DC

## 2023-09-26 NOTE — TOC Transition Note (Signed)
 Transition of Care [facility]) - Discharge Note   Patient Details  Name: [name] MRN: [id number] Date of Birth: Dec 20, 1960  [facility] CM/SW Contact:  Rosaline JONELLE Joe, RN Phone Number: 09/26/2023, 3:14 PM   Clinical Narrative:    CM met with the patient at the bedside and patient is discharging home today.  Patient was discharged home with portable oxygen tanks from [employer].  Sister is providing transportation to home via car.  I called [employer] and Davina will deliver the concentrator to the patient's home today.     Barriers to Discharge: Continued Medical Work up   Patient Goals and CMS Choice Patient states their goals for this hospitalization and ongoing recovery are:: return home CMS Medicare.gov Compare Post Acute Care list provided to:: Patient Choice offered to / list presented to : Patient      Discharge Placement                       Discharge Plan and Services Additional resources added to the After Visit Summary for   In-house Referral: NA Discharge Planning Services: CM Consult Post Acute Care Choice: Durable Medical Equipment, Home Health            DME Agency: NA       HH Arranged: PT, OT HH Agency: [facility] Date [facility] Agency Contacted: 09/06/23 Time HH Agency Contacted: [id number] Representative spoke with at [facility] Agency: Amy  Social Drivers of Health (SDOH) Interventions SDOH Screenings   Food Insecurity: No Food Insecurity (08/28/2023)  Housing: Low Risk  (08/28/2023)  Transportation Needs: No Transportation Needs (08/28/2023)  Utilities: Not At Risk (08/28/2023)  Tobacco Use: High Risk (09/19/2023)     Readmission Risk Interventions    09/18/2023   11:26 AM 08/30/2023    1:18 PM  Readmission Risk Prevention Plan  Transportation Screening Complete Complete  PCP or Specialist Appt within 5-7 Days Complete   Home Care Screening Complete   Medication Review (RN CM) Complete   HRI or Home Care Consult  Complete   Social Work Consult for Recovery Care Planning/Counseling  Complete  Palliative Care Screening  Not Applicable  Medication Review Ocean[employer])  Referral to Pharmacy

## 2023-09-26 NOTE — Discharge Summary (Signed)
 Physician Discharge Summary   Patient: [name] MRN: [id number] DOB: Oct 04, 1960  Admit date:     08/27/2023  Discharge date: 09/26/23  Discharge Physician: Deliliah Room   PCP: Zena Frederick, DO   Recommendations at discharge:    Follow up with your PCP in one week. Call your cardiologist's office at [facility] to schedule an appointment. Continue taking meds as prescribed. Check your Blood pressure and heart rate before taking meds Return to ED if you develop chest pain, fever, palpitations bleeding from any site or shortness of breath  Discharge Diagnoses: Principal Problem:   Septic shock (HCC) Active Problems:   Esophageal dysphagia   Paroxysmal atrial fibrillation (HCC)   Stage 3 severe COPD by GOLD classification (HCC)   Malnutrition of moderate degree   Acute hypoxic respiratory failure (HCC)   Achalasia   Dysphagia   Protein-calorie malnutrition, severe   Abnormal esophagram   Stricture and stenosis of esophagus   Food impaction of esophagus   Atrial fibrillation with rapid ventricular response [facility])   Hospital Course: 63yo male smoker with hx HTN, COPD, PAF on eliquis , NICM, HFpEF presented 7/14 with 1 week progressive SOB, cough, malaise, poor appetite. On EMS arrival with hypoxic with sats 80% on RA. In ER initially BP was soft with SBP 80's, tachy 130's but responded well to 1L fluids. CXR revealed dense RUL PNA. He was admitted by [facility], started on IV rocephin /azithro. On 7/15 he was still boarding in ER and again having hypotension with SBP 80's and transferred to PCCM, s/p bronch as he required high flow O2. He was transferred to [facility] on 7/30.   Septic shock: Resolved Transferred from PCCM to [facility] 7/30.   Finished linezolid  plus meropenem  total 7 days.   Acute hypoxic respiratory failure Dyspnea has improved.  Home oxygen arranged.   Community-acquired right upper lobe possible bacterial pneumonia, treated and resolved completed antibiotics under PCCM  service Patient needs to follow with pulmonology as an outpatient for further management and repeat imaging.  Will probably benefit from cancer screening and biopsy as an outpatient if needed   COPD exacerbation Possible contributing etiology to hypoxia.  Continue nebulizers. Continue Mucinex  600 mg p.o. twice daily Tussionex prn for cough Home oxygen arranged Out patient follow up with Pulmonology   Acute HFpEF: resolved, NYHA Class III,  Echocardiogram with preserved LV systolic function with EF 55 to 60%, RV systolic function preserved, LA with moderate dilatation, no significant valvular disease.   Plan to continue diuresis with furosemide , reduced dose to 20 mg due to risk of hypotension.  Dced metoprolol  because of hypotension.   Paroxysmal atrial fibrillation, converted to sinus on discharge   Improved rate control, plan to continue amiodarone  200 mg bid x 7 days and then once daily, Anticoagulation with apixaban .  Discussed case with Dr Anner on the discharge day Patient will follow up with his own cardiologist at [facility] on the scheduled appointment.   Dysphagia: History of achalasia in the past.  Modified barium showing retention in the esophagus.   GI consulted, s/p EGD:  - Food in the lower third of the esophagus. Removal was successful. Nasoenteric tube present with distal end in 2nd portion of duodenum. Removed and discarded. Benign-appearing esophageal stenosis at GE junction. Dilated to 15 mm. 5 cm hiatal hernia. 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   Acute urinary retention: resolved  In&Out catheter 7/29 secondary to retention.  Continue bethanechol  10  mg po TID.   Follow up with urology.   Moderate protein calorie malnutrition Dietary on board.  Cortrack removed.   Disposition: Home       Consultants: Cardiology Procedures performed: None  Disposition: Home Diet recommendation:   Cardiac diet DISCHARGE MEDICATION: Allergies as of 09/26/2023       Reactions   Codeine Nausea And Vomiting        Medication List     STOP taking these medications    diltiazem 120 MG 24 hr capsule Commonly known as: CARDIZEM CD   lisinopril 10 MG tablet Commonly known as: ZESTRIL   oxyCODONE -acetaminophen  10-325 MG tablet Commonly known as: PERCOCET Replaced by: oxyCODONE -acetaminophen  5-325 MG tablet   potassium chloride  10 MEQ tablet Commonly known as: KLOR-CON    spironolactone  25 MG tablet Commonly known as: ALDACTONE        TAKE these medications    albuterol  (2.5 MG/3ML) 0.083% nebulizer solution Commonly known as: PROVENTIL  Take 2.5 mg by nebulization every 4 (four) hours as needed for wheezing or shortness of breath.   amiodarone  200 MG tablet Commonly known as: PACERONE  Take 1 tablet (200 mg total) by mouth 2 (two) times daily. What changed: when to take this   ascorbic acid  500 MG tablet Commonly known as: VITAMIN C  Take 1 tablet (500 mg total) by mouth daily. Start taking on: September 27, 2023   aspirin  81 MG chewable tablet Chew 81 mg by mouth daily.   atorvastatin  40 MG tablet Commonly known as: LIPITOR Take 1 tablet (40 mg total) by mouth daily. Start taking on: September 27, 2023 What changed:  medication strength how much to take   bethanechol  10 MG tablet Commonly known as: URECHOLINE  Take 1 tablet (10 mg total) by mouth 3 (three) times daily.   diazepam  5 MG tablet Commonly known as: VALIUM  Take 1 tablet (5 mg total) by mouth every 12 (twelve) hours as needed for anxiety, muscle spasms or sedation.   Eliquis  5 MG Tabs tablet Generic drug: apixaban  Take 5 mg by mouth 2 (two) times daily.   esomeprazole 40 MG capsule Commonly known as: NEXIUM Take 40 mg by mouth every morning.   fluticasone  50 MCG/ACT nasal spray Commonly known as: FLONASE  Place 2 sprays into the nose daily as needed for allergies.   furosemide  20 MG  tablet Commonly known as: LASIX  Take 20 mg by mouth daily.   iron  polysaccharides 150 MG capsule Commonly known as: NIFEREX Take 1 capsule (150 mg total) by mouth daily. Start taking on: September 27, 2023   magnesium  oxide 400 MG tablet Commonly known as: MAG-OX Take 400 mg by mouth daily.   multivitamin with minerals Tabs tablet Take 1 tablet by mouth daily. Start taking on: September 27, 2023   oxyCODONE -acetaminophen  5-325 MG tablet Commonly known as: PERCOCET/ROXICET Take 1 tablet by mouth every 6 (six) hours as needed for severe pain (pain score 7-10). Replaces: oxyCODONE -acetaminophen  10-325 MG tablet   revefenacin  175 MCG/3ML nebulizer solution Commonly known as: YUPELRI  Take 3 mLs (175 mcg total) by nebulization daily. Start taking on: September 27, 2023   thiamine  100 MG tablet Commonly known as: Vitamin B-1 Take 1 tablet (100 mg total) by mouth daily. Start taking on: September 27, 2023               Durable Medical Equipment  (From admission, onward)           Start     Ordered   09/20/23 1559  For home  use only DME oxygen  Once       Question Answer Comment  Length of Need 6 Months   Mode or (Route) Nasal cannula   Liters per Minute 4   Frequency Continuous (stationary and portable oxygen unit needed)   Oxygen conserving device Yes   Oxygen delivery system Gas      09/20/23 1558   09/20/23 1412  For home use only DME oxygen  Once       Question Answer Comment  Length of Need 12 Months   Liters per Minute 4   Frequency Continuous (stationary and portable oxygen unit needed)   Oxygen conserving device Yes   Oxygen delivery system Gas      09/20/23 1412   09/18/23 1136  For home use only DME Bedside commode  Once       Comments: Needs 3:1 since patient has weak mobility and unable to mobilize to bathroom.  Question Answer Comment  Patient needs a bedside commode to treat with the following condition Septic shock [facility])   Patient needs a bedside  commode to treat with the following condition Physical deconditioning      09/18/23 1136            Follow-up Information     [facility] Follow up.   Why: Home health has been arranged. They will contact you to schedule apt within 48hrs post discharge. Contact information: [address] [place] [address] [phone]         [facility] Follow up.   Why: [facility] will provide home health services.  They will call you in the next 24-48 hours to set up services. Contact information: [address] [place] [address] [phone]         Zena Frederick, DO. Schedule an appointment as soon as possible for a visit in 1 week(s).   Specialty: Internal Medicine Contact information: [address] [place] [address] [phone]         Raylene Debby MATSU., MD. Schedule an appointment as soon as possible for a visit in 1 week(s).   Specialty: Cardiology Contact information: [address] [place] [address] [phone]         [facility]. Schedule an appointment as soon as possible for a visit in 1 month(s).   Specialty: Pulmonology Contact information: [address] [place]  [address] [phone] Additional information: [address]  [address]  [place], [place] [address]        [facility]. Schedule an appointment as soon as possible for a visit in 1 month(s).   Contact information: [address] [place]  [address] [phone]               Discharge Exam: Filed Weights   09/23/23 0414 09/25/23 0500 09/26/23 0500  Weight: 76.3 kg 76.6 kg 77.8 kg   Constitutional: NAD, calm, comfortable, nasal oxygen in place Eyes: PERRL, lids and conjunctivae normal ENMT: Mucous membranes are moist. Posterior pharynx clear of any exudate or  lesions.Normal dentition.  Neck: normal, supple, no masses, no thyromegaly Respiratory: clear to auscultation bilaterally, no wheezing, no crackles. Normal respiratory effort. No accessory muscle use.  Cardiovascular: Regular rate and rhythm, no murmurs / rubs / gallops. No extremity edema. 2+ pedal pulses. No carotid bruits.  Abdomen: no tenderness,  no masses palpated. No hepatosplenomegaly. Bowel sounds positive.  Musculoskeletal: no clubbing / cyanosis. No joint deformity upper and lower extremities. Good ROM, no contractures. Normal muscle tone.  Skin: no rashes, lesions, ulcers. No induration Neurologic: CN 2-12 grossly intact. Sensation intact, DTR normal. Strength 5/5 x all 4 extremities.  Psychiatric: Normal judgment and insight. Alert and oriented x 3. Normal mood.    Condition at discharge: good  The results of significant diagnostics from this hospitalization (including imaging, microbiology, ancillary and laboratory) are listed below for reference.   Imaging Studies: DG Chest 2 View Result Date: 09/18/2023 EXAM: 2 VIEW(S) XRAY OF THE CHEST 09/18/2023 12:50:00 PM COMPARISON: 2 chest radiograph dated 09/09/2023, CT chest dated 09/06/2023. CLINICAL HISTORY: 200808 Hypoxia 200808. HYPOXIA,SOB,COUGH,CONGESTION FINDINGS: LUNGS AND PLEURA: Increased right apical opacity. Interstitial opacities in the right mid and lower lungs are decreased in conspicuity. Minimal left basilar patchy opacity. HEART AND MEDIASTINUM: No acute abnormality of the cardiac and mediastinal silhouettes. BONES AND SOFT TISSUES: No acute osseous abnormality. LINES AND TUBES: Feeding tube tip terminates below the field of view. IMPRESSION: 1. Increased right apical opacity, which may represent pneumonia or loculated pleural effusion 2. Decreased interstitial opacities in the right mid and lower lungs, likely improving pneumonia. 3. No focal left lung consolidation. Minimal left basilar patchy opacity may reflect residual  infection. Electronically signed by: Manford Cummins MD 09/18/2023 04:42 PM EDT RP Workstation: [id number]   DG ESOPHAGUS W SINGLE CM (SOL OR THIN BA) Result Date: 09/17/2023 CLINICAL DATA:  Chronic dysphagia. History of esophageal stricture in 2019. Suspected history of achalasia, although unable to confirm the chart review. IR consulted for esophagram for further evaluation and to rule out achalasia or other esophageal abnormality. EXAM: ESOPHAGUS/BARIUM SWALLOW/TABLET STUDY TECHNIQUE: Single contrast examination was performed using thin liquid barium. Limited study due to poor patient mobility and poor oral intake secondary to inadequate passage of barium through the esophagus. This exam was performed by Kimble Clas, PA-C, and was supervised and interpreted by Dr. Jearld. FLUOROSCOPY: Radiation Exposure Index (as provided by the fluoroscopic device): 7.30 mGy Kerma COMPARISON:  None Available. FINDINGS: Swallowing: Grossly unremarkable although not completely evaluated due to patient's physical condition. Pharynx: Grossly unremarkable although not completely evaluated due to patient's physical condition. Esophagus: Relative narrowing of mild segment of the very distal esophagus. Persistent smooth oval filling defect over the distal esophagus immediately proximal to the relative narrowing as cannot exclude a mass in this location. Esophageal motility: Poor esophageal motility with significant tertiary contractions. Hiatal Hernia: Not present. Gastroesophageal reflux: Not evaluated. Ingested 13 mm barium tablet: Not given. Other: None. IMPRESSION: Relative narrowing of the very distal mild segment of the esophagus with persistent oval smoothly bordered filling defect within the distal esophagus just proximal to this narrowing. Recommend further evaluation with direct visualization. Electronically Signed   By: Toribio Jearld M.D.   On: 09/17/2023 16:32   DG Swallowing Func-Speech Pathology Result Date:  09/11/2023 Table formatting from the original result was not included. Images from the original result were not included. Modified Barium Swallow Study Patient Details Name: Creg Gilmer MRN: [id number] Date of Birth: [birth year] Today's Date: 09/11/2023 HPI/PMH: HPI: Larrie Fraizer is a 63 yo male presenting to ED 7/14 with SOB, cough, malaise, and poor appetite x1 week. CXR showed dense RLL PNA. Complicated by septic shock with pressor needs and transfer to ICU 7/15. Transferred back to [facility] 7/20 but with worsening hypoxia on [facility] 7/24. CXR 7/24 shows increased R lung opacity, concerning for  worsening R PNA. CCM re-consulted and CT Chest pending. Thought to have a primary esophageal dysphagia at the time of previous SLP evaluation 7/15 and had been scheduled for an OP esophageal dilation 7/28 PTA. PMH includes former EtOH abuse, tobacco abuse, paroxysmal A-fib, chronic HFpEF, nonischemic cardiomyopathy, HTN, HLD, COPD, esophageal dysphagia with history of stricture and dysmotility s/p multiple dilations Clinical Impression: Clinical Impression: Patient presents with an oropharyngeal swallow that is [facility] as per this MBS. Swallow initiated at level of vallecular sinus for liquids and solids. Mild amount of vallecular residuals remained s/p initial swallows with solids and liquids but cleared with subsequent swallows. Two instances of penetration, one flash (PAS 2) and one where penetrate did not immediately clear from laryngeal vestibule (PAS 3) observed with thin liquids. No instances of aspiration occured with any of the tested barium consistencies. PES opening appeared [facility]. During esophageal sweep, appearance of barium stasis observed throughout majority of esophagus with retrograde flow below PES. SLP informed MD of findings and will s/o at this time as patient's dysphagia is primarily esophageal in nature. Factors that may increase risk of adverse event in presence of aspiration Noe & Lianne 2021): Factors  that may increase risk of adverse event in presence of aspiration Noe & Lianne 2021): Frail or deconditioned; Presence of tubes (ETT, trach, NG, etc.); Poor general health and/or compromised immunity Recommendations/Plan: Swallowing Evaluation Recommendations Swallowing Evaluation Recommendations Recommendations: PO diet PO Diet Recommendation: -- (thin liquids, and any type of solids; recs per GI) Liquid Administration via: Cup; Straw Postural changes: Position pt fully upright for meals; Stay upright 30-60 min after meals Oral care recommendations: Oral care BID (2x/day) Treatment Plan Treatment Plan Treatment recommendations: No treatment recommended at this time Follow-up recommendations: No SLP follow up Functional status assessment: Patient has not had a recent decline in their functional status. Recommendations Recommendations for follow up therapy are one component of a multi-disciplinary discharge planning process, led by the attending physician.  Recommendations may be updated based on patient status, additional functional criteria and insurance authorization. Assessment: Orofacial Exam: Orofacial Exam Oral Cavity: Oral Hygiene: WFL Orofacial Anatomy: WFL Oral Motor/Sensory Function: WFL Anatomy: Anatomy: Prominent cricopharyngeus Boluses Administered: Boluses Administered Boluses Administered: Thin liquids (Level 0); Mildly thick liquids (Level 2, nectar thick); Moderately thick liquids (Level 3, honey thick); Puree; Solid  Oral Impairment Domain: Oral Impairment Domain Lip Closure: No labial escape Tongue control during bolus hold: Not tested Bolus preparation/mastication: Timely and efficient chewing and mashing Bolus transport/lingual motion: Brisk tongue motion Oral residue: Complete oral clearance Location of oral residue : N/A Initiation of pharyngeal swallow : Valleculae  Pharyngeal Impairment Domain: Pharyngeal Impairment Domain Soft palate elevation: No bolus between soft palate  (SP)/pharyngeal wall (PW) Laryngeal elevation: Complete superior movement of thyroid cartilage with complete approximation of arytenoids to epiglottic petiole Anterior hyoid excursion: Partial anterior movement Epiglottic movement: Complete inversion Laryngeal vestibule closure: Complete, no air/contrast in laryngeal vestibule Pharyngeal stripping wave : Present - complete Pharyngeal contraction (A/P view only): N/A Pharyngoesophageal segment opening: Complete distension and complete duration, no obstruction of flow Tongue base retraction: No contrast between tongue base and posterior pharyngeal wall (PPW) Pharyngeal residue: Collection of residue within or on pharyngeal structures Location of pharyngeal residue: Valleculae  Esophageal Impairment Domain: Esophageal Impairment Domain Esophageal clearance upright position: Esophageal retention with retrograde flow below pharyngoesophageal segment (PES); Esophageal retention Pill: Pill Consistency administered: Thin liquids (Level 0) Thin liquids (Level 0): [facility] Penetration/Aspiration Scale Score: Penetration/Aspiration Scale Score 1.  Material does  not enter airway: Mildly thick liquids (Level 2, nectar thick); Moderately thick liquids (Level 3, honey thick); Puree; Solid; Pill 2.  Material enters airway, remains ABOVE vocal cords then ejected out: Thin liquids (Level 0) Compensatory Strategies: Compensatory Strategies Compensatory strategies: Yes Straw: Effective Effective Straw: Thin liquid (Level 0)   General Information: No data recorded Diet Prior to this Study: NPO   Temperature : Normal   No data recorded  Supplemental O2: Nasal cannula   History of Recent Intubation: No  Behavior/Cognition: Alert; Cooperative; Pleasant mood Self-Feeding Abilities: Needs assist with self-feeding Baseline vocal quality/speech: Normal Volitional Cough: Able to elicit Volitional Swallow: Able to elicit Exam Limitations: No limitations Goal Planning: Prognosis for improved  oropharyngeal function: Good No data recorded No data recorded Patient/Family Stated Goal: none stated Consulted and agree with results and recommendations: Patient Pain: Pain Assessment Pain Assessment: No/denies pain Pain Score: 0 Pain Location: back and abdomen Pain Descriptors / Indicators: Discomfort; Sore Pain Intervention(s): Monitored during session; Repositioned; Patient requesting pain meds-RN notified End of Session: Start Time:SLP Start Time (ACUTE ONLY): 1545 Stop Time: SLP Stop Time (ACUTE ONLY): 1405 Time Calculation:SLP Time Calculation (min) (ACUTE ONLY): 1340 min Charges: SLP Evaluations $ SLP Speech Visit: 1 Visit SLP Evaluations $MBS Swallow: 1 Procedure SLP visit diagnosis: SLP Visit Diagnosis: Dysphagia, pharyngoesophageal phase (R13.14) Past Medical History: Past Medical History: Diagnosis Date  Anxiety   Arthritis   Cardiomyopathy (HCC)   CHF (congestive heart failure) (HCC)   COPD (chronic obstructive pulmonary disease) (HCC)   Dysphagia   with esophageal stricture  Dysrhythmia   atrial fib  GERD (gastroesophageal reflux disease)   History of blood transfusion   Hypertension  Past Surgical History: Past Surgical History: Procedure Laterality Date  ACETABULAR REVISION Right 05/15/2012  Procedure: ACETABULAR REVISION;  Surgeon: Dempsey LULLA Moan, MD;  Location: [facility];  Service: Orthopedics;  Laterality: Right;  RIGHT HIP BEARING SURFACE VS ACETABULAR REVISION   ESOPHAGOGASTRODUODENOSCOPY    with dilitation  EYE SURGERY Right   cataract extraction with IOL  FLEXIBLE BRONCHOSCOPY Bilateral 09/07/2023  Procedure: BRONCHOSCOPY, FLEXIBLE;  Surgeon: Kara Dorn NOVAK, MD;  Location: [facility] ENDOSCOPY;  Service: Pulmonary;  Laterality: Bilateral;  JOINT REPLACEMENT Bilateral   hips/revision right x 4 Norleen IVAR Blase, MA, CCC-SLP Speech Therapy   DG Chest Port 1 View Result Date: 09/09/2023 CLINICAL DATA:  Hypoxia EXAM: PORTABLE CHEST 1 VIEW COMPARISON:  09/08/2023 FINDINGS: Diffuse airspace disease  throughout the right lung. Left lower lobe airspace opacities also noted. Findings similar to prior study. Heart and mediastinal contours within normal limits. Aortic atherosclerosis. No effusions or pneumothorax. Right apical pleural thickening, stable. IMPRESSION: Diffuse right lung airspace disease with right apical pleural thickening, stable. Stable left lower lobe airspace disease. Electronically Signed   By: Franky Crease M.D.   On: 09/09/2023 21:37   US  Abdomen Limited RUQ (LIVER/GB) Result Date: 09/08/2023 CLINICAL DATA:  Elevated liver enzymes. EXAM: ULTRASOUND ABDOMEN LIMITED RIGHT UPPER QUADRANT COMPARISON:  Last study which covers the area was CT chest without contrast 08/27/2023. FINDINGS: Gallbladder: The gallbladder is dilated up 12 cm in length. There is no wall thickening, stones, pericholecystic fluid or positive sonographic Murphy sign. Common bile duct: Diameter: Prominent measuring 8.7 mm. There is at least mild intrahepatic bile duct dilatation as well. This was not seen on July 14. Liver: No focal lesion identified. Parenchymal echogenicity is increased consistent with at least mild steatosis. Portal vein is patent on color Doppler imaging with normal direction of blood flow  towards the liver. Other: Small layering right pleural effusion. IMPRESSION: 1. Dilated gallbladder with no wall thickening, stones, pericholecystic fluid or positive sonographic Murphy sign. 2. Prominent common bile duct measuring 8.7 mm with at least mild intrahepatic bile duct dilatation as well. This was not seen on July 14. Recommend MRCP or ERCP. 3. At least mild hepatic steatosis. 4. Small layering right pleural effusion. Electronically Signed   By: Francis Quam M.D.   On: 09/08/2023 06:13   DG CHEST PORT 1 VIEW Result Date: 09/08/2023 CLINICAL DATA:  33498. Respiratory failure. Extensive right-sided pneumonia. EXAM: PORTABLE CHEST 1 VIEW COMPARISON:  Chest CT and portable chest both 09/06/2023. FINDINGS:  Extensive airspace disease in the right lung continues to be seen with small layering pleural effusion and apparently a small loculated effusion in the right apex. There is increased small to moderate size left pleural effusion. The left lung is otherwise clear. There is a normal heart size with stable cardiomediastinal silhouette. Aortic atherosclerosis. No new osseous abnormality.  Degenerative change thoracic spine. Feeding tube extends well into the stomach towards the right new from the prior studies, but the radiopaque tip is out of view. IMPRESSION: 1. Extensive airspace disease in the right lung continues to be seen with small layering pleural effusion and apparently a small loculated effusion in the right apex. 2. Increased small to moderate size left pleural effusion. Left chest otherwise clear. 3. Feeding tube extends well into the stomach towards the right new from the prior studies, but the radiopaque tip is out of view. 4. Aortic atherosclerosis. Electronically Signed   By: Francis Quam M.D.   On: 09/08/2023 06:07   DG Abd Portable 1V Result Date: 09/07/2023 CLINICAL DATA:  [id number] Encounter for feeding tube placement [id number] EXAM: PORTABLE ABDOMEN - 1 VIEW COMPARISON:  None Available. FINDINGS: Weighted feeding tube terminates in the region of the gastric antrum/duodenal bulb. Nonobstructive bowel gas pattern. Surgical staple line in the right lower quadrant and left pelvis. No pneumoperitoneum. No organomegaly or radiopaque calculi. Multilevel degenerative disc disease of the spine. Bilateral hip arthroplasties, partially visualized. IMPRESSION: Weighted feeding tube terminates in the region of the gastric antrum/duodenal bulb. Continued advancement recommended for postpyloric positioning. Electronically Signed   By: Rogelia Myers M.D.   On: 09/07/2023 15:54   CT CHEST WO CONTRAST Result Date: 09/06/2023 CLINICAL DATA:  Pneumonia EXAM: CT CHEST WITHOUT CONTRAST TECHNIQUE: Multidetector CT  imaging of the chest was performed following the standard protocol without IV contrast. RADIATION DOSE REDUCTION: This exam was performed according to the departmental dose-optimization program which includes automated exposure control, adjustment of the mA and/or kV according to patient size and/or use of iterative reconstruction technique. COMPARISON:  Chest x-ray 09/06/2023, CT chest 08/27/2023, 08/29/2021, 09/07/2022 FINDINGS: Cardiovascular: Limited evaluation without intravenous contrast. Moderate aortic atherosclerosis. No aneurysm. Coronary vascular calcification. Normal cardiac size. Trace pericardial effusion. Mediastinum/Nodes: Patent trachea. No suspicious thyroid mass. Mild mediastinal lymph nodes as before. Low right paratracheal node measuring 12 mm, previously 12 mm. Esophagus shows small amount of debris within the mid lumen. Small hiatal hernia and mild circumferential distal esophageal thickening Lungs/Pleura: Emphysema. Extensive airspace disease and consolidation in the right upper lobe with slightly progressive foci of cavitation in the right upper lobe consolidation. Slightly distorted appearance of consolidation on series 4, image 38. Heterogeneous consolidation and density in the right middle lobe. Progressive heterogeneous consolidation and airspace disease in the right lower lobe. New small right greater than left pleural effusions. Upper Abdomen: No acute  finding in the upper abdomen Musculoskeletal: No acute osseous abnormality IMPRESSION: 1. Extensive airspace disease and consolidation in the right upper lobe with slightly progressive foci of cavitation in the right upper lobe consolidation suggestive of necrosis. Slightly distorted appearance of right upper lobe consolidation, underlying right upper lobe lung mass is not excluded and imaging follow-up to resolution is recommended. Significant progression of airspace disease in the right lower lobe, suspicious for pneumonia. 2. New  small right greater than left pleural effusions. 3. Aortic atherosclerosis. Aortic Atherosclerosis (ICD10-I70.0) and Emphysema (ICD10-J43.9). Electronically Signed   By: Luke Bun M.D.   On: 09/06/2023 23:23   DG CHEST PORT 1 VIEW Result Date: 09/06/2023 CLINICAL DATA:  Pneumonia. EXAM: PORTABLE CHEST 1 VIEW COMPARISON:  August 30, 2023.  August 27, 2023. FINDINGS: The heart size and mediastinal contours are within normal limits. Left lung is clear. Diffuse airspace and interstitial opacities are noted throughout the lung which are increased compared to prior exam most consistent with pneumonia. Small right pleural effusion may be present. The visualized skeletal structures are unremarkable. IMPRESSION: Increased right lung opacity is noted diffusely concerning for worsening right-sided pneumonia. Small right pleural effusion may be present. Electronically Signed   By: Lynwood Landy Raddle M.D.   On: 09/06/2023 13:15   VAS US  LOWER EXTREMITY VENOUS (DVT) Result Date: 08/30/2023  Lower Venous DVT Study Patient Name:  NICHOLI GHUMAN  Date of Exam:   08/30/2023 Medical Rec #: [id number]         Accession #:    [id number] Date of Birth: 07/08/1960          Patient Gender: M Patient Age:   63 years Exam Location:  [facility] Procedure:      VAS US  LOWER EXTREMITY VENOUS (DVT) Referring Phys: TORIBIO SHARPS --------------------------------------------------------------------------------  Indications: Edema.  Comparison Study: No previous exams Performing Technologist: Jody Hill RVT, RDMS  Examination Guidelines: A complete evaluation includes B-mode imaging, spectral Doppler, color Doppler, and power Doppler as needed of all accessible portions of each vessel. Bilateral testing is considered an integral part of a complete examination. Limited examinations for reoccurring indications may be performed as noted. The reflux portion of the exam is performed with the patient in reverse Trendelenburg.   +---------+---------------+---------+-----------+----------+--------------+ RIGHT    CompressibilityPhasicitySpontaneityPropertiesThrombus Aging +---------+---------------+---------+-----------+----------+--------------+ CFV      Full           Yes      Yes                                 +---------+---------------+---------+-----------+----------+--------------+ SFJ      Full                                                        +---------+---------------+---------+-----------+----------+--------------+ FV Prox  Full           Yes      Yes                                 +---------+---------------+---------+-----------+----------+--------------+ FV Mid   Full           Yes      Yes                                 +---------+---------------+---------+-----------+----------+--------------+  FV DistalFull           Yes      Yes                                 +---------+---------------+---------+-----------+----------+--------------+ PFV      Full                                                        +---------+---------------+---------+-----------+----------+--------------+ POP      Full           Yes      Yes                                 +---------+---------------+---------+-----------+----------+--------------+ PTV      Full                                                        +---------+---------------+---------+-----------+----------+--------------+ PERO     Full                                                        +---------+---------------+---------+-----------+----------+--------------+   +---------+---------------+---------+-----------+----------+--------------+ LEFT     CompressibilityPhasicitySpontaneityPropertiesThrombus Aging +---------+---------------+---------+-----------+----------+--------------+ CFV      Full           Yes      Yes                                  +---------+---------------+---------+-----------+----------+--------------+ SFJ      Full                                                        +---------+---------------+---------+-----------+----------+--------------+ FV Prox  Full           Yes      Yes                                 +---------+---------------+---------+-----------+----------+--------------+ FV Mid   Full           Yes      Yes                                 +---------+---------------+---------+-----------+----------+--------------+ FV DistalFull           Yes      Yes                                 +---------+---------------+---------+-----------+----------+--------------+ PFV      Full                                                        +---------+---------------+---------+-----------+----------+--------------+  POP      Full           Yes      Yes                                 +---------+---------------+---------+-----------+----------+--------------+ PTV      Full                                                        +---------+---------------+---------+-----------+----------+--------------+ PERO     Full                                                        +---------+---------------+---------+-----------+----------+--------------+     Summary: BILATERAL: - No evidence of deep vein thrombosis seen in the lower extremities, bilaterally. -No evidence of popliteal cyst, bilaterally.   *See table(s) above for measurements and observations. Electronically signed by Debby Robertson on 08/30/2023 at 5:04:31 PM.    Final    DG Chest Port 1 View Result Date: 08/30/2023 CLINICAL DATA:  Pneumonia EXAM: PORTABLE CHEST 1 VIEW COMPARISON:  08/29/2023 FINDINGS: Stable consolidation of the right upper lobe. Increased right perihilar and infrahilar airspace opacity suspicious for multilobar pneumonia on the right. Interstitial accentuation noted at the right lung base, mildly worsened.  Atherosclerotic calcification of the aortic arch. IMPRESSION: 1. Stable consolidation of the right upper lobe. 2. Increased right perihilar and infrahilar airspace opacity suspicious for multilobar pneumonia. 3. Interstitial accentuation at the right lung base, mildly worsened. 4. Aortic Atherosclerosis (ICD10-I70.0). Electronically Signed   By: Ryan Salvage M.D.   On: 08/30/2023 09:04   DG CHEST PORT 1 VIEW Result Date: 08/30/2023 CLINICAL DATA:  Pneumonia, shortness of breath EXAM: PORTABLE CHEST 1 VIEW COMPARISON:  None Available. FINDINGS: 08/30/2023 at 6:12 a.m. persistent dense consolidation of most of the right upper lobe. Hazy right perihilar and infrahilar airspace opacity along with stable indistinct interstitial opacities throughout the remainder of the right lung. The left lung appears clear. Atherosclerotic calcification of the aortic arch. Heart size within normal limits. IMPRESSION: 1. Persistent dense consolidation of most of the right upper lobe, with hazy right perihilar and infrahilar airspace opacity along with indistinct interstitial opacities throughout the remainder of the right lung. Followup chest imaging is recommended in 3-4 weeks following presumed antibiotic therapy to ensure resolution and exclude underlying malignancy. 2. Aortic Atherosclerosis (ICD10-I70.0). Electronically Signed   By: Ryan Salvage M.D.   On: 08/30/2023 09:03   DG Chest Port 1 View Result Date: 08/29/2023 CLINICAL DATA:  Follow-up pneumonia EXAM: PORTABLE CHEST 1 VIEW COMPARISON:  08/27/23 FINDINGS: Stable cardiomediastinal contours. Aortic atherosclerotic calcifications. No significant pleural effusion or signs of pneumothorax. Airspace consolidation is identified within the right upper lobe, persistent right upper lobe airspace consolidation appears unchanged from the previous exam. Mild increased interstitial markings in the right lower lobe also unchanged. Left lung remains clear. IMPRESSION: 1. No  change in right upper lobe airspace consolidation compatible with pneumonia. 2. Stable increased interstitial markings in the right lower lobe. Electronically Signed   By: Waddell Calk M.D.   On: 08/29/2023 05:51  ECHOCARDIOGRAM COMPLETE Result Date: 08/28/2023    ECHOCARDIOGRAM REPORT   Patient Name:   Raphel Stickles Date of Exam: 08/28/2023 Medical Rec #:  [id number]        Height:       69.0 in Accession #:    [id number]       Weight:       190.0 lb Date of Birth:  Sep 14, 1960         BSA:          2.021 m Patient Age:    63 years         BP:           85/58 mmHg Patient Gender: M                HR:           111 bpm. Exam Location:  Inpatient Procedure: 2D Echo, Cardiac Doppler and Color Doppler (Both Spectral and Color            Flow Doppler were utilized during procedure). Indications:    Elevated Troponin  History:        Patient has no prior history of Echocardiogram examinations. CHF                 and Cardiomyopathy, CAD, COPD, Arrythmias:Atrial Fibrillation,                 Signs/Symptoms:Dyspnea; Risk Factors:Current Smoker and                 Dyslipidemia.  Sonographer:    Thea Norlander RCS Referring Phys: TERRY LOISE HURST  Sonographer Comments: Image acquisition challenging due to patient behavioral factors., Image acquisition challenging due to patient body habitus and Image acquisition challenging due to uncooperative patient. IMPRESSIONS  1. Left ventricular ejection fraction, by estimation, is 55 to 60%. The left ventricle has normal function. The left ventricle has no regional wall motion abnormalities.  2. Right ventricular systolic function is normal. The right ventricular size is normal. Tricuspid regurgitation signal is inadequate for assessing PA pressure.  3. Left atrial size was moderately dilated.  4. The mitral valve is normal in structure. No evidence of mitral valve regurgitation. No evidence of mitral stenosis.  5. The aortic valve is normal in structure. Aortic valve  regurgitation is trivial. Comparison(s): Only apical views were obtained. FINDINGS  Left Ventricle: Left ventricular ejection fraction, by estimation, is 55 to 60%. The left ventricle has normal function. The left ventricle has no regional wall motion abnormalities. The left ventricular internal cavity size was normal in size. There is  no left ventricular hypertrophy. Left ventricular diastolic function could not be evaluated due to atrial fibrillation. Right Ventricle: The right ventricular size is normal. No increase in right ventricular wall thickness. Right ventricular systolic function is normal. Tricuspid regurgitation signal is inadequate for assessing PA pressure. Left Atrium: Left atrial size was moderately dilated. Right Atrium: Right atrial size was normal in size. Pericardium: There is no evidence of pericardial effusion. Mitral Valve: The mitral valve is normal in structure. No evidence of mitral valve regurgitation. No evidence of mitral valve stenosis. Tricuspid Valve: The tricuspid valve is normal in structure. Tricuspid valve regurgitation is mild. Aortic Valve: The aortic valve is normal in structure. Aortic valve regurgitation is trivial. Aortic valve peak gradient measures 5.6 mmHg. Pulmonic Valve: The pulmonic valve was not well visualized. Aorta: The aortic root was not well visualized. Venous: The inferior vena cava was not well visualized.  IAS/Shunts: No atrial level shunt detected by color flow Doppler.   Diastology LV e' medial:    11.40 cm/s LV E/e' medial:  6.4 LV e' lateral:   15.70 cm/s LV E/e' lateral: 4.7  RIGHT VENTRICLE RV S prime:     18.60 cm/s TAPSE (M-mode): 1.2 cm LEFT ATRIUM           Index        RIGHT ATRIUM           Index LA Vol (A4C): 56.6 ml 28.00 ml/m  RA Area:     16.30 cm                                    RA Volume:   41.90 ml  20.73 ml/m  AORTIC VALVE AV Vmax:      118.00 cm/s AV Peak Grad: 5.6 mmHg LVOT Vmax:    121.00 cm/s LVOT Vmean:   76.500 cm/s LVOT VTI:      0.165 m MITRAL VALVE               TRICUSPID VALVE MV Area (PHT): 3.62 cm    TR Peak grad:   19.4 mmHg MV Decel Time: 210 msec    TR Vmax:        220.00 cm/s MV E velocity: 73.40 cm/s MV A velocity: 52.10 cm/s  SHUNTS MV E/A ratio:  1.41        Systemic VTI: 0.16 m Jerel Croitoru MD Electronically signed by Jerel Balding MD Signature Date/Time: 08/28/2023/3:58:50 PM    Final    CT CHEST WO CONTRAST Result Date: 08/27/2023 CLINICAL DATA:  Increased right apical airspace opacity EXAM: CT CHEST WITHOUT CONTRAST TECHNIQUE: Multidetector CT imaging of the chest was performed following the standard protocol without IV contrast. RADIATION DOSE REDUCTION: This exam was performed according to the departmental dose-optimization program which includes automated exposure control, adjustment of the mA and/or kV according to patient size and/or use of iterative reconstruction technique. COMPARISON:  Chest x-ray from earlier in the same day. FINDINGS: Cardiovascular: Atherosclerotic calcifications of the thoracic aorta are noted. Absence of contrast material somewhat limits the vascular evaluation. Heart is not significantly enlarged in size. Heavy coronary calcifications are noted. Mediastinum/Nodes: Thoracic inlet is within normal limits. No hilar or mediastinal adenopathy is noted. At least two tablets are noted within the mid to distal esophagus. Lungs/Pleura: Significant emphysematous changes are noted. Left lung is well aerated. Right lung is well aerated but demonstrate significant airspace consolidation. Patchy airspace opacity is noted in the right middle and right lower lobes medially. These changes are felt to represent extensive pneumonia. The possibility of a neoplasm however cannot be totally excluded. No sizable effusion is seen. Upper Abdomen: No acute abnormality. Musculoskeletal: No chest wall mass or suspicious bone lesions identified. IMPRESSION: Extensive airspace consolidation in the right upper lobe  with lesser changes in the right middle and lower lobes consistent with multifocal infiltrate. Short-term follow-up following appropriate therapy is recommended to assess for resolution. Aortic Atherosclerosis (ICD10-I70.0) and Emphysema (ICD10-J43.9). Electronically Signed   By: Oneil Devonshire M.D.   On: 08/27/2023 23:07   DG Chest Port 1 View Result Date: 08/27/2023 CLINICAL DATA:  Shortness of breath for 2-3 days. History of congestive heart failure and COPD. EXAM: PORTABLE CHEST 1 VIEW COMPARISON:  Radiographs 04/04/2018 and 11/11/2015. Chest CT 09/07/2022. FINDINGS: 1524 hours. Two AP semi erect views of the  chest are submitted. There is new dense right upper lobe airspace disease with associated volume loss. The left lung appears clear. No evidence of pleural effusion or pneumothorax. The visualized heart size and mediastinal contours are stable with aortic atherosclerosis. No acute osseous findings are evident. Telemetry leads overlie the chest. IMPRESSION: New dense right upper lobe airspace disease with associated volume loss, suspicious for pneumonia. Differential includes reactivation tuberculosis and malignancy. Correlate clinically. Followup PA and lateral chest X-ray is recommended in 4-6 weeks following appropriate therapy to ensure resolution and exclude underlying malignancy. Electronically Signed   By: Elsie Perone M.D.   On: 08/27/2023 15:37    Microbiology: Results for orders placed or performed during the hospital encounter of 08/27/23  Culture, blood (routine x 2)     Status: None   Collection Time: 08/27/23  1:42 PM   Specimen: BLOOD  Result Value Ref Range Status   Specimen Description BLOOD LEFT ANTECUBITAL  Final   Special Requests   Final    BOTTLES DRAWN AEROBIC AND ANAEROBIC Blood Culture results may not be optimal due to an inadequate volume of blood received in culture bottles   Culture   Final    NO GROWTH 5 DAYS Performed at [facility], [address],  [place], [place] [address]    Report Status 09/01/2023 FINAL  Final  Acid Fast Smear (AFB)     Status: None   Collection Time: 08/27/23  2:00 PM   Specimen: Sputum  Result Value Ref Range Status   AFB Specimen Processing Concentration  Final   Acid Fast Smear Negative  Final    Comment: (NOTE) Performed At: [facility] [address] [place], [place] [id number] Jennette Shorter MD Ey:[id number]    Source (AFB) SPU  Final    Comment: Performed at [facility], [address] [place], [place] [address]  Culture, blood (routine x 2)     Status: None   Collection Time: 08/27/23  3:19 PM   Specimen: BLOOD RIGHT HAND  Result Value Ref Range Status   Specimen Description BLOOD RIGHT HAND  Final   Special Requests   Final    BOTTLES DRAWN AEROBIC AND ANAEROBIC Blood Culture results may not be optimal due to an inadequate volume of blood received in culture bottles   Culture   Final    NO GROWTH 5 DAYS Performed at [facility], [address] [place], [place] [address]    Report Status 09/01/2023 FINAL  Final  SARS Coronavirus 2 by RT PCR (hospital order, performed in [facility] hospital lab) *cepheid single result test* Anterior Nasal Swab     Status: None   Collection Time: 08/27/23  3:42 PM   Specimen: Anterior Nasal Swab  Result Value Ref Range Status   SARS Coronavirus 2 by RT PCR NEGATIVE NEGATIVE Final    Comment: Performed at [facility], [address], [place], [place] [address]  Expectorated Sputum Assessment w Gram Stain, Rflx to Resp Cult     Status: None   Collection Time: 08/27/23 11:16 PM   Specimen: Sputum  Result Value Ref Range Status   Specimen Description SPU  Final   Special Requests NONE  Final   Sputum evaluation   Final    THIS SPECIMEN IS ACCEPTABLE FOR SPUTUM CULTURE Performed at [facility], [address] [place], [place] [address]    Report Status 08/28/2023 FINAL  Final  Culture, Respiratory w Gram  Stain     Status:  None   Collection Time: 08/27/23 11:16 PM   Specimen: Sputum  Result Value Ref Range Status   Specimen Description SPU  Final   Special Requests NONE Reflexed from [id number]  Final   Gram Stain   Final    ABUNDANT WBC PRESENT, PREDOMINANTLY PMN ABUNDANT GRAM POSITIVE COCCI FEW GRAM POSITIVE RODS Performed at [facility], [address], [place], [place] [address]    Culture ABUNDANT STREPTOCOCCUS CONSTELLATUS  Final   Report Status 08/31/2023 FINAL  Final   Organism ID, Bacteria STREPTOCOCCUS CONSTELLATUS  Final      Susceptibility   Streptococcus constellatus - MIC*    PENICILLIN INTERMEDIATE Intermediate     CEFTRIAXONE  1 SENSITIVE Sensitive     ERYTHROMYCIN 4 RESISTANT Resistant     LEVOFLOXACIN <=0.25 SENSITIVE Sensitive     VANCOMYCIN 0.5 SENSITIVE Sensitive     * ABUNDANT STREPTOCOCCUS CONSTELLATUS  Expectorated Sputum Assessment w Gram Stain, Rflx to Resp Cult     Status: None   Collection Time: 08/28/23  1:28 PM   Specimen: Expectorated Sputum  Result Value Ref Range Status   Specimen Description EXPECTORATED SPUTUM  Final   Special Requests NONE  Final   Sputum evaluation   Final    THIS SPECIMEN IS ACCEPTABLE FOR SPUTUM CULTURE Performed at [facility], [address], [place], [place] [address]    Report Status 08/28/2023 FINAL  Final  MTB-RIF NAA with AFB Culture, sputum (q8 x 3)     Status: None (Preliminary result)   Collection Time: 08/28/23  1:28 PM   Specimen: Sputum  Result Value Ref Range Status   Myco tuberculosis Complex NOT DETECTED NOT DETECTED Final   Rifampin Not applicable NOT DETECTED Final   AFB Specimen Processing Concentration  Final    Comment: (NOTE) Performed At: [facility] [address] [place], [place] [id number] Jennette Shorter MD Ey:[id number]    Acid Fast Culture PENDING  Incomplete   Source (MTB RIF) SPU  Final    Comment: Performed at [facility], [address], [place], [place] [address]  Culture,  Respiratory w Gram Stain     Status: None   Collection Time: 08/28/23  1:28 PM  Result Value Ref Range Status   Specimen Description EXPECTORATED SPUTUM  Final   Special Requests NONE Reflexed from [id number]  Final   Gram Stain   Final    ABUNDANT WBC PRESENT, PREDOMINANTLY PMN ABUNDANT GRAM POSITIVE COCCI IN PAIRS FEW GRAM POSITIVE RODS RARE GRAM NEGATIVE RODS    Culture   Final    ABUNDANT STREPTOCOCCUS CONSTELLATUS SUSCEPTIBILITIES PERFORMED ON PREVIOUS CULTURE WITHIN THE LAST 5 DAYS. Performed at [facility], [address], [place], [place] [address]    Report Status 08/31/2023 FINAL  Final  MRSA Next Gen by PCR, Nasal     Status: None   Collection Time: 08/28/23  2:57 PM   Specimen: Nasal Mucosa; Nasal Swab  Result Value Ref Range Status   MRSA by PCR Next Gen NOT DETECTED NOT DETECTED Final    Comment: (NOTE) The GeneXpert MRSA Assay (FDA approved for NASAL specimens only), is one component of a comprehensive MRSA colonization surveillance program. It is not intended to diagnose MRSA infection nor to guide or monitor treatment for MRSA infections. Test performance is not FDA approved in patients less than 2 years old. Performed at [facility], [address] [place], [place]  [id number]   MTB-RIF NAA with AFB Culture, sputum (q8 x 3)     Status: None (Preliminary result)   Collection Time: 08/28/23  9:28 PM   Specimen: Sputum  Result Value Ref Range Status   Myco tuberculosis Complex NOT DETECTED NOT DETECTED Final   Rifampin Not applicable NOT DETECTED Final   AFB Specimen Processing Concentration  Final    Comment: (NOTE) Performed At: [facility] [address] [place], [place] [id number] Jennette Shorter MD Ey:[id number]    Acid Fast Culture PENDING  Incomplete   Source (MTB RIF) SPU  Final    Comment: Performed at [facility], [address] [place], [place] [address]  MTB-RIF NAA with AFB Culture, sputum (q8 x 3)     Status: None  (Preliminary result)   Collection Time: 08/28/23  9:30 PM   Specimen: Sputum  Result Value Ref Range Status   Myco tuberculosis Complex NOT DETECTED NOT DETECTED Final   Rifampin Not applicable NOT DETECTED Final   AFB Specimen Processing Concentration  Final    Comment: (NOTE) Performed At: [facility] [address] [place], [place] [id number] Jennette Shorter MD Ey:[id number]    Acid Fast Culture PENDING  Incomplete   Source (MTB RIF) SPUTUM  Final    Comment: Performed at [facility], [address] [place], [place] [address]  MRSA Next Gen by PCR, Nasal     Status: None   Collection Time: 09/06/23 12:07 PM   Specimen: Nasal Mucosa; Nasal Swab  Result Value Ref Range Status   MRSA by PCR Next Gen NOT DETECTED NOT DETECTED Final    Comment: (NOTE) The GeneXpert MRSA Assay (FDA approved for NASAL specimens only), is one component of a comprehensive MRSA colonization surveillance program. It is not intended to diagnose MRSA infection nor to guide or monitor treatment for MRSA infections. Test performance is not FDA approved in patients less than 54 years old. Performed at [facility], [address] [place], [place] [address]   Fungus Culture With Stain     Status: Abnormal   Collection Time: 09/07/23 11:28 AM   Specimen: Bronchial Alveolar Lavage; Respiratory  Result Value Ref Range Status   Fungus Stain Final report  Final   Fungus (Mycology) Culture Preliminary report (A)  Final    Comment: (NOTE) Performed At: [facility] [address] [place], [place] [id number] Jennette Shorter MD Ey:[id number]    Fungal Source BRONCHIAL ALVEOLAR LAVAGE  Final    Comment: Performed at [facility], [address], [place], [place] [address]  Culture, BAL-quantitative w Gram Stain     Status: None   Collection Time: 09/07/23 11:28 AM   Specimen: Bronchial Alveolar Lavage; Respiratory  Result Value Ref Range Status   Specimen Description  BRONCHIAL ALVEOLAR LAVAGE  Final   Special Requests NONE  Final   Gram Stain NO WBC SEEN NO ORGANISMS SEEN   Final   Culture   Final    NO GROWTH 2 DAYS Performed at [facility], [address], [place], [place] [address]    Report Status 09/09/2023 FINAL  Final  Aerobic/Anaerobic Culture w Gram Stain (surgical/deep wound)     Status: None   Collection Time: 09/07/23 11:28 AM   Specimen: Bronchial Alveolar Lavage; Respiratory  Result Value Ref Range Status   Specimen Description BRONCHIAL ALVEOLAR LAVAGE  Final   Special Requests NONE  Final   Gram Stain NO WBC SEEN NO  ORGANISMS SEEN   Final   Culture   Final    FEW CANDIDA TROPICALIS NO ANAEROBES ISOLATED Performed at [facility], [address] [place], [place] [address]    Report Status 09/12/2023 FINAL  Final  Acid Fast Smear (AFB)     Status: None   Collection Time: 09/07/23 11:28 AM   Specimen: Bronchial Alveolar Lavage; Respiratory  Result Value Ref Range Status   AFB Specimen Processing Concentration  Final   Acid Fast Smear Negative  Final    Comment: (NOTE) Performed At: [facility] [address] [place], [place] [id number] Jennette Shorter MD Ey:[id number]    Source (AFB) BRONCHIAL ALVEOLAR LAVAGE  Final    Comment: Performed at [facility], [address], [place], [place] [address]  Fungus Culture Result     Status: None   Collection Time: 09/07/23 11:28 AM  Result Value Ref Range Status   Result 1 Comment  Final    Comment: (NOTE) KOH/Calcofluor preparation:  no fungus observed. Performed At: [facility] [address] [place], [place] [id number] Jennette Shorter MD Ey:[id number]   Fungal organism reflex     Status: Abnormal   Collection Time: 09/07/23 11:28 AM  Result Value Ref Range Status   Fungal result 1 Candida tropicalis (A)  Final    Comment: (NOTE) Moderate growth Performed At: [facility] [place] [address] [place], [place] [id number] Jennette Shorter MD Ey:[id number]     Labs: CBC: Recent Labs  Lab 09/20/23 0159 09/21/23 0147 09/25/23 1400  WBC 6.3 7.0 6.9  HGB 9.2* 10.0* 10.2*  HCT 28.9* 31.2* 32.2*  MCV 84.3 83.6 85.9  PLT 293 345 323   Basic Metabolic Panel: Recent Labs  Lab 09/20/23 0159 09/21/23 0147 09/22/23 0146 09/23/23 0521 09/25/23 1400  NA 138 144  --  140 139  K 4.3 4.4  --  4.1 3.5  CL 104 106  --  103 103  CO2 26 27  --  27 28  GLUCOSE 103* 117*  --  85 116*  BUN 23 25*  --  32* 21  CREATININE 0.80 0.70  --  0.94 0.83  CALCIUM  8.2* 8.6*  --  8.4* 8.3*  MG 1.7 1.8 1.7 1.7  --   PHOS 3.7 3.2 3.4 3.3  --    Liver Function Tests: No results for input(s): AST, ALT, ALKPHOS, BILITOT, PROT, ALBUMIN  in the last 168 hours. CBG: Recent Labs  Lab 09/25/23 0819 09/25/23 1221 09/25/23 1638 09/25/23 1959 09/26/23 0423  GLUCAP 75 88 132* 93 82    Discharge time spent: 45 minutes.  Signed: Deliliah Room, MD [facility] 09/26/2023

## 2023-09-26 NOTE — Progress Notes (Signed)
 Heart Failure Navigator Progress Note  Assessed for Heart & Vascular TOC clinic readiness.  Patient does not meet criteria due to EF 55-60%, admitted for evaluation of Atrial Fibrillation. No HF TOC. .   Navigator will sign off at this time.   Stephane Haddock, BSN, Scientist, clinical (histocompatibility and immunogenetics) Only

## 2023-09-26 NOTE — Progress Notes (Signed)
 Brief cardiology note: Based on telemetry indicating drop in heart rate, it appears that he is converted to sinus rhythm/sinus bradycardia.  Blood pressures appear to be more stable.  [facility] will sign off.   Medication Recommendations:  For now, we will continue amiodarone  200 mg twice daily to complete 1 week of 200 twice daily and then reduce to once daily.  Can follow-up with primary cardiologist to determine length of therapy.  If BP will not tolerate, can hold off on beta-blocker.  Continue oral Lasix  as written. Other recommendations (labs, testing, etc): No testing needed Follow up as an outpatient: He has an outpatient cardiologist in the [facility]-has seen Dr. Raylene Alm Needle, MD    Alm Clay, MD

## 2023-09-26 NOTE — Plan of Care (Signed)

## 2023-09-26 NOTE — Progress Notes (Signed)
 EKG completed and photo in media. Hard copy placed in chart.

## 2023-09-26 NOTE — Progress Notes (Addendum)
 AVS education completed PIVs removed. Patient verbally understans plan of care.  Patient has 2 Oxygen tanks for home and states that his other oxygen equipment has been delivered to his address. Patient is on 3Liters [place] denies sob or resp distress. Patient is ambulatory.   Patient to transfer to discharge lounge.  Discharge lounge notified of arrival.

## 2023-10-04 NOTE — Telephone Encounter (Signed)
 error

## 2023-10-11 LAB — FUNGUS CULTURE WITH STAIN

## 2023-10-11 LAB — FUNGUS CULTURE RESULT

## 2023-10-11 LAB — FUNGAL ORGANISM REFLEX

## 2023-10-12 LAB — MTB-RIF NAA WITH AFB CULTURE, SPUTUM
Acid Fast Culture: NEGATIVE
Acid Fast Culture: NEGATIVE
Myco tuberculosis Complex: NOT DETECTED
Myco tuberculosis Complex: NOT DETECTED

## 2023-10-14 LAB — MTB-RIF NAA WITH AFB CULTURE, SPUTUM
Acid Fast Culture: NEGATIVE
Myco tuberculosis Complex: NOT DETECTED

## 2023-10-21 LAB — ACID FAST CULTURE WITH REFLEXED SENSITIVITIES (MYCOBACTERIA): Acid Fast Culture: NEGATIVE

## 2023-10-30 ENCOUNTER — Ambulatory Visit

## 2024-01-30 ENCOUNTER — Encounter: Payer: Self-pay | Admitting: Emergency Medicine

## 2024-02-14 DEATH — deceased
